# Patient Record
Sex: Female | Born: 1950 | Race: White | Hispanic: No | Marital: Married | Smoking: Never smoker
Health system: Southern US, Community
[De-identification: ages and names within clinical notes are randomized; demographics above are authoritative.]

## PROBLEM LIST (undated history)

## (undated) DIAGNOSIS — K635 Polyp of colon: Secondary | ICD-10-CM

## (undated) DIAGNOSIS — I499 Cardiac arrhythmia, unspecified: Secondary | ICD-10-CM

## (undated) DIAGNOSIS — M1712 Unilateral primary osteoarthritis, left knee: Secondary | ICD-10-CM

## (undated) DIAGNOSIS — K219 Gastro-esophageal reflux disease without esophagitis: Secondary | ICD-10-CM

## (undated) DIAGNOSIS — N6019 Diffuse cystic mastopathy of unspecified breast: Secondary | ICD-10-CM

## (undated) DIAGNOSIS — K5792 Diverticulitis of intestine, part unspecified, without perforation or abscess without bleeding: Secondary | ICD-10-CM

## (undated) DIAGNOSIS — M8580 Other specified disorders of bone density and structure, unspecified site: Secondary | ICD-10-CM

## (undated) DIAGNOSIS — M1990 Unspecified osteoarthritis, unspecified site: Secondary | ICD-10-CM

## (undated) DIAGNOSIS — D2111 Benign neoplasm of connective and other soft tissue of right upper limb, including shoulder: Secondary | ICD-10-CM

## (undated) DIAGNOSIS — G4700 Insomnia, unspecified: Secondary | ICD-10-CM

## (undated) DIAGNOSIS — H25012 Cortical age-related cataract, left eye: Secondary | ICD-10-CM

## (undated) DIAGNOSIS — E785 Hyperlipidemia, unspecified: Secondary | ICD-10-CM

## (undated) DIAGNOSIS — K5790 Diverticulosis of intestine, part unspecified, without perforation or abscess without bleeding: Secondary | ICD-10-CM

## (undated) HISTORY — PX: ORIF ANKLE FRACTURE: SUR919

## (undated) HISTORY — DX: Diverticulitis of intestine, part unspecified, without perforation or abscess without bleeding: K57.92

## (undated) HISTORY — PX: APPENDECTOMY: SHX54

## (undated) HISTORY — PX: ABDOMINAL HYSTERECTOMY: SHX81

---
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
## 1994-07-23 HISTORY — PX: BREAST BIOPSY: SHX20

## 1999-07-24 HISTORY — PX: ABDOMINAL HYSTERECTOMY: SHX81

## 2004-07-23 HISTORY — PX: ORIF ANKLE FRACTURE: SUR919

## 2004-08-14 ENCOUNTER — Ambulatory Visit: Payer: Self-pay | Admitting: Internal Medicine

## 2004-09-01 ENCOUNTER — Ambulatory Visit: Payer: Self-pay

## 2004-10-20 ENCOUNTER — Ambulatory Visit: Payer: Self-pay | Admitting: General Surgery

## 2005-02-05 ENCOUNTER — Ambulatory Visit: Payer: Self-pay | Admitting: Internal Medicine

## 2005-04-19 ENCOUNTER — Encounter: Payer: Self-pay | Admitting: Internal Medicine

## 2005-04-22 ENCOUNTER — Encounter: Payer: Self-pay | Admitting: Internal Medicine

## 2005-08-22 ENCOUNTER — Ambulatory Visit: Payer: Self-pay | Admitting: Internal Medicine

## 2006-02-26 ENCOUNTER — Ambulatory Visit: Payer: Self-pay | Admitting: Internal Medicine

## 2006-03-11 ENCOUNTER — Ambulatory Visit: Payer: Self-pay | Admitting: Internal Medicine

## 2006-03-14 ENCOUNTER — Encounter: Payer: Self-pay | Admitting: Internal Medicine

## 2006-03-23 ENCOUNTER — Encounter: Payer: Self-pay | Admitting: Internal Medicine

## 2006-04-22 ENCOUNTER — Encounter: Payer: Self-pay | Admitting: Internal Medicine

## 2006-07-12 ENCOUNTER — Ambulatory Visit: Payer: Self-pay | Admitting: Internal Medicine

## 2006-10-30 ENCOUNTER — Ambulatory Visit: Payer: Self-pay | Admitting: Internal Medicine

## 2007-05-02 ENCOUNTER — Ambulatory Visit: Payer: Self-pay | Admitting: Internal Medicine

## 2007-11-05 ENCOUNTER — Ambulatory Visit: Payer: Self-pay | Admitting: Internal Medicine

## 2008-05-10 ENCOUNTER — Ambulatory Visit: Payer: Self-pay | Admitting: Internal Medicine

## 2008-06-02 ENCOUNTER — Encounter: Payer: Self-pay | Admitting: Sports Medicine

## 2008-06-22 ENCOUNTER — Encounter: Payer: Self-pay | Admitting: Sports Medicine

## 2008-11-11 ENCOUNTER — Ambulatory Visit: Payer: Self-pay | Admitting: Internal Medicine

## 2009-05-23 ENCOUNTER — Ambulatory Visit: Payer: Self-pay | Admitting: Internal Medicine

## 2009-11-23 ENCOUNTER — Ambulatory Visit: Payer: Self-pay | Admitting: Internal Medicine

## 2010-05-29 ENCOUNTER — Ambulatory Visit: Payer: Self-pay | Admitting: Internal Medicine

## 2010-12-12 ENCOUNTER — Ambulatory Visit: Payer: Self-pay | Admitting: Internal Medicine

## 2011-06-18 ENCOUNTER — Ambulatory Visit: Payer: Self-pay | Admitting: Internal Medicine

## 2011-06-19 ENCOUNTER — Encounter: Payer: Self-pay | Admitting: Sports Medicine

## 2011-06-23 ENCOUNTER — Encounter: Payer: Self-pay | Admitting: Sports Medicine

## 2011-07-24 ENCOUNTER — Encounter: Payer: Self-pay | Admitting: Sports Medicine

## 2011-12-19 ENCOUNTER — Ambulatory Visit: Payer: Self-pay | Admitting: Internal Medicine

## 2012-06-17 ENCOUNTER — Ambulatory Visit: Payer: Self-pay | Admitting: Internal Medicine

## 2012-12-18 ENCOUNTER — Ambulatory Visit: Payer: Self-pay | Admitting: Internal Medicine

## 2013-07-01 ENCOUNTER — Ambulatory Visit: Payer: Self-pay

## 2013-09-01 ENCOUNTER — Ambulatory Visit: Payer: Self-pay | Admitting: Family Medicine

## 2013-11-19 DIAGNOSIS — K219 Gastro-esophageal reflux disease without esophagitis: Secondary | ICD-10-CM | POA: Insufficient documentation

## 2013-11-19 DIAGNOSIS — J309 Allergic rhinitis, unspecified: Secondary | ICD-10-CM | POA: Insufficient documentation

## 2013-11-19 DIAGNOSIS — E785 Hyperlipidemia, unspecified: Secondary | ICD-10-CM | POA: Insufficient documentation

## 2014-04-02 ENCOUNTER — Other Ambulatory Visit: Payer: Self-pay | Admitting: Internal Medicine

## 2014-04-02 DIAGNOSIS — Z803 Family history of malignant neoplasm of breast: Secondary | ICD-10-CM

## 2014-04-13 ENCOUNTER — Ambulatory Visit: Payer: Self-pay | Admitting: Family Medicine

## 2014-04-23 ENCOUNTER — Ambulatory Visit: Payer: Self-pay | Admitting: Family Medicine

## 2014-10-25 ENCOUNTER — Ambulatory Visit
Admit: 2014-10-25 | Disposition: A | Discharge status: Home / Self Care | Payer: Self-pay | Attending: Family Medicine | Admitting: Family Medicine

## 2014-12-09 ENCOUNTER — Encounter: Payer: Self-pay | Admitting: *Deleted

## 2014-12-10 ENCOUNTER — Ambulatory Visit
Admission: RE | Admit: 2014-12-10 | Discharge: 2014-12-10 | Disposition: A | Discharge status: Home / Self Care | Payer: 59 | Source: Ambulatory Visit | Attending: Gastroenterology | Admitting: Gastroenterology

## 2014-12-10 ENCOUNTER — Ambulatory Visit: Payer: 59 | Admitting: Anesthesiology

## 2014-12-10 ENCOUNTER — Encounter
Admission: RE | Disposition: A | Discharge status: Home / Self Care | Payer: Self-pay | Source: Ambulatory Visit | Attending: Gastroenterology

## 2014-12-10 ENCOUNTER — Encounter: Payer: Self-pay | Admitting: Anesthesiology

## 2014-12-10 DIAGNOSIS — Z885 Allergy status to narcotic agent status: Secondary | ICD-10-CM | POA: Diagnosis not present

## 2014-12-10 DIAGNOSIS — D122 Benign neoplasm of ascending colon: Secondary | ICD-10-CM | POA: Insufficient documentation

## 2014-12-10 DIAGNOSIS — Z882 Allergy status to sulfonamides status: Secondary | ICD-10-CM | POA: Insufficient documentation

## 2014-12-10 DIAGNOSIS — E785 Hyperlipidemia, unspecified: Secondary | ICD-10-CM | POA: Insufficient documentation

## 2014-12-10 DIAGNOSIS — K5730 Diverticulosis of large intestine without perforation or abscess without bleeding: Secondary | ICD-10-CM | POA: Insufficient documentation

## 2014-12-10 DIAGNOSIS — Z79899 Other long term (current) drug therapy: Secondary | ICD-10-CM | POA: Diagnosis not present

## 2014-12-10 DIAGNOSIS — Z1211 Encounter for screening for malignant neoplasm of colon: Secondary | ICD-10-CM | POA: Diagnosis not present

## 2014-12-10 DIAGNOSIS — K219 Gastro-esophageal reflux disease without esophagitis: Secondary | ICD-10-CM | POA: Insufficient documentation

## 2014-12-10 DIAGNOSIS — K648 Other hemorrhoids: Secondary | ICD-10-CM | POA: Insufficient documentation

## 2014-12-10 DIAGNOSIS — Z881 Allergy status to other antibiotic agents status: Secondary | ICD-10-CM | POA: Insufficient documentation

## 2014-12-10 HISTORY — DX: Hyperlipidemia, unspecified: E78.5

## 2014-12-10 HISTORY — DX: Gastro-esophageal reflux disease without esophagitis: K21.9

## 2014-12-10 HISTORY — DX: Cardiac arrhythmia, unspecified: I49.9

## 2014-12-10 HISTORY — PX: COLONOSCOPY: SHX5424

## 2014-12-10 SURGERY — COLONOSCOPY
Anesthesia: General

## 2014-12-10 MED ORDER — FENTANYL CITRATE (PF) 100 MCG/2ML IJ SOLN
INTRAMUSCULAR | Status: DC | PRN
  Administered 2014-12-10: 50 ug via INTRAVENOUS

## 2014-12-10 MED ORDER — SODIUM CHLORIDE 0.9 % IV SOLN
INTRAVENOUS | Status: DC
  Administered 2014-12-10: 1000 mL via INTRAVENOUS

## 2014-12-10 MED ORDER — PROPOFOL 10 MG/ML IV BOLUS
INTRAVENOUS | Status: DC | PRN
  Administered 2014-12-10 (×2): 40 mg via INTRAVENOUS

## 2014-12-10 MED ORDER — ONDANSETRON HCL 4 MG/2ML IJ SOLN
INTRAMUSCULAR | Status: DC | PRN
  Administered 2014-12-10: 4 mg via INTRAVENOUS

## 2014-12-10 MED ORDER — MIDAZOLAM HCL 5 MG/5ML IJ SOLN
INTRAMUSCULAR | Status: DC | PRN
  Administered 2014-12-10: 2 mg via INTRAVENOUS

## 2014-12-10 MED ORDER — SODIUM CHLORIDE 0.9 % IV SOLN: INTRAVENOUS | Status: DC

## 2014-12-10 MED ORDER — EPHEDRINE SULFATE 50 MG/ML IJ SOLN
INTRAMUSCULAR | Status: DC | PRN
  Administered 2014-12-10 (×2): 10 mg via INTRAVENOUS

## 2014-12-10 MED ORDER — LACTATED RINGERS IV SOLN
INTRAVENOUS | Status: DC | PRN
  Administered 2014-12-10: 12:00:00 via INTRAVENOUS

## 2014-12-10 MED ORDER — PROPOFOL INFUSION 10 MG/ML OPTIME
INTRAVENOUS | Status: DC | PRN
  Administered 2014-12-10: 140 ug/kg/min via INTRAVENOUS

## 2014-12-10 MED ORDER — LIDOCAINE HCL (CARDIAC) 20 MG/ML IV SOLN
INTRAVENOUS | Status: DC | PRN
  Administered 2014-12-10: 40 mg via INTRAVENOUS

## 2014-12-10 NOTE — Progress Notes (Signed)
Left AC IV D/C'd intact.

## 2014-12-10 NOTE — H&P (Signed)
Outpatient short stay form Pre-procedure 12/10/2014 11:38 AM Patricia Sails MD  Primary Physician: Dr. Baldemar Lenis  Reason for visit:  Screening colonoscopy  History of present illness:  Patient is a 64 year old Caucasian female who is presenting today for a screening colonoscopy as noted. Her last colonoscopy was in February 2006 no polyps that time. She has no family history of colon cancer or colon polyps. She takes no aspirin products or anticoagulants.    Current facility-administered medications:  .  0.9 %  sodium chloride infusion, , Intravenous, Continuous, Patricia Sails, MD, Last Rate: 20 mL/hr at 12/10/14 1108, 1,000 mL at 12/10/14 1108  Prescriptions prior to admission  Medication Sig Dispense Refill Last Dose  . Ascorbic Acid (VITAMIN C) 100 MG tablet Take 400 mg by mouth daily.   12/09/2014 at Unknown time  . azelastine (ASTELIN) 0.1 % nasal spray Place 1 spray into both nostrils 2 (two) times daily. Use in each nostril as directed   12/09/2014 at Unknown time  . calcium citrate-vitamin D (CITRACAL+D) 315-200 MG-UNIT per tablet Take 2 tablets by mouth 2 (two) times daily.   12/09/2014 at Unknown time  . celecoxib (CELEBREX) 200 MG capsule Take 200 mg by mouth daily.   12/09/2014 at Unknown time  . cetirizine (ZYRTEC) 10 MG tablet Take 10 mg by mouth daily.   12/09/2014 at Unknown time  . Estradiol (VAGIFEM VA) Place 1 applicator vaginally 2 (two) times a week.   12/09/2014 at Unknown time  . fluticasone (FLONASE) 50 MCG/ACT nasal spray Place 2 sprays into both nostrils daily.   12/09/2014 at Unknown time  . glucosamine-chondroitin 500-400 MG tablet Take 1 tablet by mouth 3 (three) times daily.   12/09/2014 at Unknown time  . guaifenesin (HUMIBID E) 400 MG TABS tablet Take 400 mg by mouth daily. For allergies for maple and pine trees   12/09/2014 at Unknown time  . promethazine (PHENERGAN) 25 MG tablet Take 25 mg by mouth every 6 (six) hours as needed for nausea or vomiting.    12/09/2014 at Unknown time  . simvastatin (ZOCOR) 40 MG tablet Take 40 mg by mouth daily.   12/09/2014 at Unknown time  . vitamin E 400 UNIT capsule Take 400 Units by mouth daily.   12/09/2014 at Unknown time     Allergies  Allergen Reactions  . Morphine And Related Nausea And Vomiting  . Ciprofloxacin Swelling and Rash  . Sulfur Itching, Swelling and Rash     Past Medical History  Diagnosis Date  . Dysrhythmia   . GERD (gastroesophageal reflux disease)   . Hyperlipidemia     Review of systems:      Physical Exam    Heart and lungs: Regular rate and rhythm without rub or gallop lungs are bilaterally clear to auscultation    HEENT: Normocephalic atraumatic eyes are anicteric    Other: Skip that    Pertinant exam for procedure: Soft nontender nondistended bowel sounds positive normoactive    Planned proceedures: Colonoscopy with indicated procedures. I have discussed the risks benefits and complications of procedures to include not limited to bleeding, infection, perforation and the risk of sedation and the patient wishes to proceed.    Patricia Sails, MD Gastroenterology 12/10/2014  11:38 AM

## 2014-12-10 NOTE — Anesthesia Preprocedure Evaluation (Addendum)
Anesthesia Evaluation  Patient identified by MRN, date of birth, ID band Patient awake    Reviewed: Allergy & Precautions, NPO status , Patient's Chart, lab work & pertinent test results  History of Anesthesia Complications (+) PONV and history of anesthetic complications (PONV with morphine)  Airway Mallampati: II  TM Distance: >3 FB Neck ROM: Full    Dental   Pulmonary          Cardiovascular + dysrhythmias (Pt denies)     Neuro/Psych    GI/Hepatic GERD-  Controlled and Medicated,  Endo/Other    Renal/GU      Musculoskeletal   Abdominal   Peds  Hematology   Anesthesia Other Findings   Reproductive/Obstetrics                            Anesthesia Physical Anesthesia Plan  ASA: II  Anesthesia Plan: General   Post-op Pain Management:    Induction: Intravenous  Airway Management Planned: Nasal Cannula  Additional Equipment:   Intra-op Plan:   Post-operative Plan:   Informed Consent: I have reviewed the patients History and Physical, chart, labs and discussed the procedure including the risks, benefits and alternatives for the proposed anesthesia with the patient or authorized representative who has indicated his/her understanding and acceptance.     Plan Discussed with:   Anesthesia Plan Comments:         Anesthesia Quick Evaluation

## 2014-12-10 NOTE — Transfer of Care (Signed)
Immediate Anesthesia Transfer of Care Note  Patient: Patricia Oliver  Procedure(s) Performed: Procedure(s): COLONOSCOPY (N/A)  Patient Location: PACU  Anesthesia Type:General  Level of Consciousness: sedated  Airway & Oxygen Therapy: Patient Spontanous Breathing and Patient connected to face mask oxygen  Post-op Assessment: Report given to RN  Post vital signs: Reviewed and stable  Last Vitals:  Filed Vitals:   12/10/14 1050  BP: 120/79  Pulse: 75  Temp: 36.1 C  Resp: 20    Complications: No apparent anesthesia complications

## 2014-12-10 NOTE — Anesthesia Postprocedure Evaluation (Signed)
  Anesthesia Post-op Note  Patient: Patricia Oliver  Procedure(s) Performed: Procedure(s): COLONOSCOPY (N/A)  Anesthesia type:General  Patient location: PACU  Post pain: Pain level controlled  Post assessment: Post-op Vital signs reviewed, Patient's Cardiovascular Status Stable, Respiratory Function Stable, Patent Airway and No signs of Nausea or vomiting  Post vital signs: Reviewed and stable  Last Vitals:  Filed Vitals:   12/10/14 1240  BP: 114/74  Pulse: 124  Temp: 36.6 C  Resp: 20    Level of consciousness: awake, alert  and patient cooperative  Complications: No apparent anesthesia complications

## 2014-12-10 NOTE — Op Note (Signed)
[facility] Gastroenterology Patient Name: Patricia Oliver Procedure Date: 12/10/2014 11:41 AM MRN: [id number] Account #: [id number] Date of Birth: 1950/10/30 Admit Type: Outpatient Age: 64 Room: [facility] ENDO ROOM 2 Gender: Female Note Status: Finalized Procedure:         Colonoscopy Indications:       Screening for colorectal malignant neoplasm Providers:         Christena Deem, MD Referring MD:      Hassell Halim (Referring MD) Medicines:         Monitored Anesthesia Care Complications:     No immediate complications. Procedure:         Pre-Anesthesia Assessment:                    - ASA Grade Assessment: II - A patient with mild systemic                     disease.                    After obtaining informed consent, the colonoscope was                     passed under direct vision. Throughout the procedure, the                     patient's blood pressure, pulse, and oxygen saturations                     were monitored continuously. The Olympus PCF-160AL                     colonoscope (S#. [id number]) was introduced through the anus                     and advanced to the the cecum, identified by appendiceal                     orifice and ileocecal valve. The colonoscopy was performed                     with moderate difficulty due to a tortuous colon.                     Successful completion of the procedure was aided by using                     manual pressure. The patient tolerated the procedure well.                     The quality of the bowel preparation was good. Findings:      Multiple small to medium diverticula were found in the sigmoid colon and       in the descending colon.      A 11 mm polyp was found in the proximal ascending colon. The polyp was       sessile. The polyp was removed with a cold biopsy forceps. Resection and       retrieval were complete.      The exam was otherwise without abnormality.      The digital rectal exam was  normal.      Non-bleeding internal hemorrhoids were found during anoscopy. The       hemorrhoids were small. Impression:        -  Diverticulosis in the sigmoid colon and in the                     descending colon.                    - One 11 mm polyp in the proximal ascending colon.                     Resected and retrieved.                    - The examination was otherwise normal.                    - Non-bleeding internal hemorrhoids. Recommendation:    - Await pathology results.                    - Telephone GI clinic for pathology results in 1 week. Procedure Code(s): --- Professional ---                    [phone], Colonoscopy, flexible; with biopsy, single or                     multiple Diagnosis Code(s): --- Professional ---                    V76.51, Special screening for malignant neoplasms of colon                    211.3, Benign neoplasm of colon                    455.0, Internal hemorrhoids without mention of complication                    562.10, Diverticulosis of colon (without mention of                     hemorrhage) CPT copyright 2014 American Medical Association. All rights reserved. The codes documented in this report are preliminary and upon coder review may  be revised to meet current compliance requirements. Christena Deem, MD 12/10/2014 12:35:55 PM This report has been signed electronically. Number of Addenda: 0 Note Initiated On: 12/10/2014 11:41 AM Scope Withdrawal Time: 0 hours 20 minutes 44 seconds  Total Procedure Duration: 0 hours 32 minutes 20 seconds       [facility]

## 2014-12-14 ENCOUNTER — Encounter: Payer: Self-pay | Admitting: Gastroenterology

## 2014-12-15 LAB — SURGICAL PATHOLOGY

## 2015-04-07 ENCOUNTER — Other Ambulatory Visit: Payer: Self-pay | Admitting: Family Medicine

## 2015-04-07 DIAGNOSIS — Z803 Family history of malignant neoplasm of breast: Secondary | ICD-10-CM

## 2015-05-16 ENCOUNTER — Ambulatory Visit
Admission: RE | Admit: 2015-05-16 | Discharge: 2015-05-16 | Disposition: A | Discharge status: Home / Self Care | Payer: 59 | Source: Ambulatory Visit | Attending: Family Medicine | Admitting: Family Medicine

## 2015-05-16 DIAGNOSIS — Z803 Family history of malignant neoplasm of breast: Secondary | ICD-10-CM

## 2015-06-07 ENCOUNTER — Ambulatory Visit: Payer: Self-pay | Admitting: Physician Assistant

## 2015-06-07 ENCOUNTER — Encounter: Payer: Self-pay | Admitting: Physician Assistant

## 2015-06-07 VITALS — BP 136/94 | HR 72 | Temp 98.6°F

## 2015-06-07 DIAGNOSIS — H9201 Otalgia, right ear: Secondary | ICD-10-CM

## 2015-06-07 DIAGNOSIS — Z9109 Other allergy status, other than to drugs and biological substances: Secondary | ICD-10-CM

## 2015-06-07 DIAGNOSIS — J329 Chronic sinusitis, unspecified: Secondary | ICD-10-CM

## 2015-06-07 DIAGNOSIS — H6523 Chronic serous otitis media, bilateral: Secondary | ICD-10-CM

## 2015-06-07 MED ORDER — MONTELUKAST SODIUM 10 MG PO TABS
10.0000 mg | ORAL_TABLET | Freq: Every day | ORAL | Status: DC
Start: 2015-06-07 — End: 2016-07-20

## 2015-06-07 NOTE — Progress Notes (Signed)
S chronic allergies and recurrant sinusitis, on rx xyzal and astelin without improvement sxs,  now with R ear pain, sore to lie on it , she just completed amoxicillan course she took belatedly from CV here, Denies grinding teeth   O/ bp mildly elevated NAD  ENT tms severely retracted, EACS clear  nasal mucosa pale, turbinates boggy , neg sinus tenderness at opening of EAC at jaw line, without TMJ crepitus.pharynx clear neck supple without nodes Heart rsr lungs clear  A/ chronic sinusitis,  Serous otitis media with ear pain and pressure, perennial allergies  P add singulair, nasal saline products and refer to ENT  Requested.

## 2015-06-08 NOTE — Progress Notes (Signed)
Spoke with [facility] ENT scheduled appt with Dr. Richardson Landry on 06/15/15 @ 9:00. Patient was notified progress notes was faxed over Fax# [phone]

## 2015-06-09 NOTE — Progress Notes (Signed)
Called in IT request to have this removed from Dr. Renold Genta as this is [facility] patient and not Dr. Verlene Mayer patient.  IT/Incident # [id number]

## 2015-06-15 ENCOUNTER — Other Ambulatory Visit: Payer: Self-pay | Admitting: Otolaryngology

## 2015-06-15 DIAGNOSIS — J324 Chronic pansinusitis: Secondary | ICD-10-CM

## 2015-06-21 ENCOUNTER — Ambulatory Visit
Admission: RE | Admit: 2015-06-21 | Discharge: 2015-06-21 | Disposition: A | Discharge status: Home / Self Care | Payer: 59 | Source: Ambulatory Visit | Attending: Otolaryngology | Admitting: Otolaryngology

## 2015-06-21 DIAGNOSIS — J324 Chronic pansinusitis: Secondary | ICD-10-CM | POA: Insufficient documentation

## 2015-06-21 DIAGNOSIS — J342 Deviated nasal septum: Secondary | ICD-10-CM | POA: Diagnosis not present

## 2015-09-04 DIAGNOSIS — J0100 Acute maxillary sinusitis, unspecified: Secondary | ICD-10-CM | POA: Diagnosis not present

## 2015-09-26 ENCOUNTER — Encounter: Payer: Self-pay | Admitting: Physician Assistant

## 2015-09-26 ENCOUNTER — Ambulatory Visit: Payer: Self-pay | Admitting: Physician Assistant

## 2015-09-26 VITALS — BP 120/80 | HR 76 | Temp 98.8°F

## 2015-09-26 DIAGNOSIS — J019 Acute sinusitis, unspecified: Secondary | ICD-10-CM

## 2015-09-27 NOTE — Progress Notes (Signed)
See note in paper chart by Heather Ratcliffe, PAC  

## 2015-10-05 DIAGNOSIS — Z79899 Other long term (current) drug therapy: Secondary | ICD-10-CM | POA: Diagnosis not present

## 2015-10-05 DIAGNOSIS — E7800 Pure hypercholesterolemia, unspecified: Secondary | ICD-10-CM | POA: Diagnosis not present

## 2015-10-14 DIAGNOSIS — E7800 Pure hypercholesterolemia, unspecified: Secondary | ICD-10-CM | POA: Diagnosis not present

## 2015-10-14 DIAGNOSIS — J301 Allergic rhinitis due to pollen: Secondary | ICD-10-CM | POA: Diagnosis not present

## 2015-10-14 DIAGNOSIS — Z803 Family history of malignant neoplasm of breast: Secondary | ICD-10-CM | POA: Diagnosis not present

## 2015-10-14 DIAGNOSIS — K219 Gastro-esophageal reflux disease without esophagitis: Secondary | ICD-10-CM | POA: Diagnosis not present

## 2015-10-19 ENCOUNTER — Other Ambulatory Visit: Payer: Self-pay | Admitting: Family Medicine

## 2015-10-19 DIAGNOSIS — Z803 Family history of malignant neoplasm of breast: Secondary | ICD-10-CM

## 2015-10-20 DIAGNOSIS — J309 Allergic rhinitis, unspecified: Secondary | ICD-10-CM | POA: Diagnosis not present

## 2015-10-20 DIAGNOSIS — H1045 Other chronic allergic conjunctivitis: Secondary | ICD-10-CM | POA: Diagnosis not present

## 2015-10-20 DIAGNOSIS — J3089 Other allergic rhinitis: Secondary | ICD-10-CM | POA: Diagnosis not present

## 2015-10-20 DIAGNOSIS — J301 Allergic rhinitis due to pollen: Secondary | ICD-10-CM | POA: Diagnosis not present

## 2015-10-28 ENCOUNTER — Ambulatory Visit
Admission: RE | Admit: 2015-10-28 | Discharge: 2015-10-28 | Disposition: A | Discharge status: Home / Self Care | Payer: 59 | Source: Ambulatory Visit | Attending: Family Medicine | Admitting: Family Medicine

## 2015-10-28 DIAGNOSIS — N6489 Other specified disorders of breast: Secondary | ICD-10-CM | POA: Diagnosis not present

## 2015-10-28 DIAGNOSIS — Z803 Family history of malignant neoplasm of breast: Secondary | ICD-10-CM

## 2015-10-28 MED ORDER — GADOBENATE DIMEGLUMINE 529 MG/ML IV SOLN
12.0000 mL | Freq: Once | INTRAVENOUS | Status: AC | PRN
  Administered 2015-10-28: 12 mL via INTRAVENOUS

## 2015-11-30 DIAGNOSIS — M21621 Bunionette of right foot: Secondary | ICD-10-CM | POA: Diagnosis not present

## 2015-11-30 DIAGNOSIS — M79671 Pain in right foot: Secondary | ICD-10-CM | POA: Diagnosis not present

## 2015-11-30 DIAGNOSIS — M7751 Other enthesopathy of right foot: Secondary | ICD-10-CM | POA: Diagnosis not present

## 2016-01-05 DIAGNOSIS — L821 Other seborrheic keratosis: Secondary | ICD-10-CM | POA: Diagnosis not present

## 2016-01-05 DIAGNOSIS — D225 Melanocytic nevi of trunk: Secondary | ICD-10-CM | POA: Diagnosis not present

## 2016-01-05 DIAGNOSIS — D2271 Melanocytic nevi of right lower limb, including hip: Secondary | ICD-10-CM | POA: Diagnosis not present

## 2016-01-05 DIAGNOSIS — D2272 Melanocytic nevi of left lower limb, including hip: Secondary | ICD-10-CM | POA: Diagnosis not present

## 2016-03-13 ENCOUNTER — Ambulatory Visit: Payer: 59 | Attending: Family Medicine | Admitting: Physical Therapy

## 2016-03-13 DIAGNOSIS — M25552 Pain in left hip: Secondary | ICD-10-CM | POA: Insufficient documentation

## 2016-03-13 DIAGNOSIS — M545 Low back pain: Secondary | ICD-10-CM | POA: Insufficient documentation

## 2016-03-13 NOTE — Therapy (Signed)
[facility] MAIN [facility] SERVICES [address] [place], [place], [address] Phone: [phone]   Fax:  [phone]  Physical Therapy Free Screen  Patient Details  Name: Patricia MARTINELL MRN: [id number] Date of Birth: 1950-08-02 No Data Recorded  Encounter Date: 03/13/2016   Past Medical History:  Diagnosis Date  . Dysrhythmia   . GERD (gastroesophageal reflux disease)   . Hyperlipidemia     Past Surgical History:  Procedure Laterality Date  . ABDOMINAL HYSTERECTOMY    . APPENDECTOMY    . BREAST BIOPSY Left 1996   neh  . COLONOSCOPY N/A 12/10/2014   Procedure: COLONOSCOPY;  Surgeon: Christena Deem, MD;  Location: [facility] ENDOSCOPY;  Service: Endoscopy;  Laterality: N/A;  . ORIF ANKLE FRACTURE      There were no vitals filed for this visit.  PT Screening Form   Time: in__4:07p____     Time out__4:43p___   Complaint __L sided low back pain with tingling and numbness radiating down LLE_____ Past Medical Hx:  __ORIF ankle fracture, allergies_____ Injury Date:_____End of July________  Pain Scale: _Current; 5/10, Worst 7/10______ Patient's phone number: [phone] (cell)  Hx (this occurrence):  Patient states that she assisted someone in moving at the end of July. After lifting and moving a lot of items that day, she drove a few hours home and began noticing symptoms in her L bottom. Since the onset of this episode, the patient reports a worsening of symptoms with numbness and tingling moving down into her leg all the way to her L foot. The patient reports she has increased difficulty with sitting for long periods of time with an increase in pain beginning after 15-20 minutes of sitting. When in the car or sitting for long periods of time, she relieves her pain by off setting her bottom with a pillow under the L bottom. She is also able to relieve her symptoms by standing, walking, or when she goes to sleep on her stomach. Her symptoms  relieve after ~20 minutes in these relieving positions. She denies B&B incontinence. The pain does not wake her up or keep her up. She reports she takes celebrix daily due to previous ankle fracture.   Assessment: Patient has fair posture with mild forward flexed head and decreased spinal curves. She is able to correct when instructed. With a gross assessment, patient's AROM in B UE/LE is [facility]; strength is consistent throughout B UE/LE with mild hip flexor weakness bilaterally. Gross assessment of lumbar AROM is good with no pain into any motion. Standing repeated motions into flexion and extension did not centralize or peripheralize symptoms although patient stated flexion felt good due to stretching.  FABERS and SI compression are negative. Patient is tender upon palpation of  L piriformis that increased her symptoms and peripheralizes. No palpable tightness on L/R piriformis. With piriformis stretching, patient notes that it stretches the painful spot with no increase in radiating symptoms. LLE long axis manual distraction relieves radiating symptoms. In prone position, patient states pain maintains in her L bottom with no radiating symptoms.     Recommendations:    Given supine and seated piriformis stretch as well as prone lumbar extensions. Patient given a handout for exercises. Instructed to perform pelvic tilts, use lumbar roll, unweight bottom with reclining passenger seat, and take breaks to move during long car rides due to patient going out of town this weekend. Instructed to patient to perform the given exercises for 2 weeks and if no  improvement or symptoms worsen quickly to call and a PT evaluation would be initiated.  Comments:  After 2 weeks, if patient has not had improved symptoms set up PT evaluation.    []  Patient would benefit from an MD referral []  Patient would benefit from a full PT/OT/ SLP evaluation and treatment. [x]  No intervention recommended at this  time.                                       Patient will benefit from skilled therapeutic intervention in order to improve the following deficits and impairments:     Visit Diagnosis: Pain in left hip  Left low back pain, with sciatica presence unspecified     Problem List There are no active problems to display for this patient.  Trula Ore, SPT This entire session was performed under direct supervision and direction of a licensed therapist/therapist assistant . I have personally read, edited and approve of the note as written.  Trotter,Margaret PT, DPT 03/14/2016, 11:24 AM  [facility] MAIN [facility] SERVICES [address] [place], [place], [address] Phone: [phone]   Fax:  [phone]  Name: CHANTRICE BOHNEN MRN: [id number] Date of Birth: 05-26-1951

## 2016-03-13 NOTE — Patient Instructions (Addendum)
Piriformis Stretch    Lying on back, pull right knee toward opposite shoulder. Hold _10-15___ seconds. Repeat _5___ times. Do __2-3__ sessions per day.  http://gt2.exer.us/258   Copyright  VHI. All rights reserved.  Piriformis Stretch, Sitting    Sit, back straight, one leg straight, other leg bent, ankle on opposite thigh. Grasp knee and ankle of crossed leg. Pull leg toward trunk. Feel stretch in gluteals. Hold _10-15__ seconds.  Repeat _5__ times per session. Do _2-3__ sessions per day.  Copyright  VHI. All rights reserved.  Back Hyperextension: Using Arms    Lying face down with arms bent, inhale. Then while exhaling, straighten arms. Hold __2__ seconds. Slowly return to starting position. Repeat __5-7__ times per set. Do __2__ sets per session. Do __2-3__ sessions per day.  Copyright  VHI. All rights reserved.

## 2016-03-30 DIAGNOSIS — M1712 Unilateral primary osteoarthritis, left knee: Secondary | ICD-10-CM | POA: Diagnosis not present

## 2016-04-13 DIAGNOSIS — Z79899 Other long term (current) drug therapy: Secondary | ICD-10-CM | POA: Diagnosis not present

## 2016-04-13 DIAGNOSIS — E7800 Pure hypercholesterolemia, unspecified: Secondary | ICD-10-CM | POA: Diagnosis not present

## 2016-04-16 ENCOUNTER — Other Ambulatory Visit: Payer: Self-pay | Admitting: Family Medicine

## 2016-04-16 DIAGNOSIS — Z1231 Encounter for screening mammogram for malignant neoplasm of breast: Secondary | ICD-10-CM

## 2016-04-16 DIAGNOSIS — Z79899 Other long term (current) drug therapy: Secondary | ICD-10-CM | POA: Diagnosis not present

## 2016-04-16 DIAGNOSIS — Z803 Family history of malignant neoplasm of breast: Secondary | ICD-10-CM | POA: Diagnosis not present

## 2016-04-16 DIAGNOSIS — E7800 Pure hypercholesterolemia, unspecified: Secondary | ICD-10-CM | POA: Diagnosis not present

## 2016-05-16 ENCOUNTER — Ambulatory Visit
Admission: RE | Admit: 2016-05-16 | Discharge: 2016-05-16 | Disposition: A | Discharge status: Home / Self Care | Payer: 59 | Source: Ambulatory Visit | Attending: Family Medicine | Admitting: Family Medicine

## 2016-05-16 DIAGNOSIS — Z1231 Encounter for screening mammogram for malignant neoplasm of breast: Secondary | ICD-10-CM | POA: Diagnosis not present

## 2016-06-08 DIAGNOSIS — R319 Hematuria, unspecified: Secondary | ICD-10-CM | POA: Diagnosis not present

## 2016-06-25 DIAGNOSIS — R319 Hematuria, unspecified: Secondary | ICD-10-CM | POA: Diagnosis not present

## 2016-06-30 DIAGNOSIS — H8110 Benign paroxysmal vertigo, unspecified ear: Secondary | ICD-10-CM | POA: Diagnosis not present

## 2016-07-20 ENCOUNTER — Ambulatory Visit: Payer: 59 | Admitting: Urology

## 2016-07-20 ENCOUNTER — Encounter: Payer: Self-pay | Admitting: Urology

## 2016-07-20 VITALS — BP 133/81 | HR 72 | Ht 64.0 in | Wt 144.3 lb

## 2016-07-20 DIAGNOSIS — R310 Gross hematuria: Secondary | ICD-10-CM | POA: Diagnosis not present

## 2016-07-20 LAB — URINALYSIS, COMPLETE
BILIRUBIN UA: NEGATIVE
GLUCOSE, UA: NEGATIVE
LEUKOCYTES UA: NEGATIVE
Nitrite, UA: NEGATIVE
PH UA: 5 (ref 5.0–7.5)
PROTEIN UA: NEGATIVE
Specific Gravity, UA: >1.03 — ABNORMAL HIGH (ref 1.005–1.030)
UUROB: 0.2 mg/dL (ref 0.2–1.0)

## 2016-07-20 LAB — MICROSCOPIC EXAMINATION

## 2016-07-20 NOTE — Progress Notes (Signed)
07/20/2016 10:46 AM   Patricia Oliver [birth year] [id number]  Referring provider: Kandyce Rud, MD [address] [facility] [place], [place] [address]  Chief Complaint  Patient presents with  . New Patient (Initial Visit)    gross hematuria     HPI: The patient is a 65 year old female presents today for evaluation of gross hematuria. His also been confirmed on microscopic examination.  This happened a proximal and one month ago. She has persistent microscopic hematuria today but her gross hematuria has resolved. She has no history of nephrolithiasis. She has not had any recent urinary tract infections. She denies dysuria or other problems urination at this time. 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 Pathology was benign.   PMH: Past Medical History:  Diagnosis Date  . Dysrhythmia   . GERD (gastroesophageal reflux disease)   . Hyperlipidemia     Surgical History: Past Surgical History:  Procedure Laterality Date  . ABDOMINAL HYSTERECTOMY    . APPENDECTOMY    . BREAST BIOPSY Left 1996   neh  . COLONOSCOPY N/A 12/10/2014   Procedure: COLONOSCOPY;  Surgeon: Christena Deem, MD;  Location: [facility] ENDOSCOPY;  Service: Endoscopy;  Laterality: N/A;  . ORIF ANKLE FRACTURE      Home Medications:  Allergies as of 07/20/2016      Reactions   Morphine And Related Nausea And Vomiting   Ciprofloxacin Swelling, Rash   Sulfur Itching, Swelling, Rash      Medication List       Accurate as of 07/20/16 10:46 AM. Always use your most recent med list.          azelastine 0.1 % nasal spray Commonly known as:  ASTELIN Place 1 spray into both nostrils 2 (two) times daily. Use in each nostril as directed   calcium citrate-vitamin D 315-200 MG-UNIT tablet Commonly known as:  CITRACAL+D Take 2 tablets by mouth 2 (two) times daily.   celecoxib 200 MG capsule Commonly known as:   CELEBREX Take by mouth.   fluticasone 50 MCG/ACT nasal spray Commonly known as:  FLONASE Place 2 sprays into both nostrils daily.   glucosamine-chondroitin 500-400 MG tablet Take 1 tablet by mouth 3 (three) times daily.   guaifenesin 400 MG Tabs tablet Commonly known as:  HUMIBID E Take 400 mg by mouth daily. For allergies for maple and pine trees   levocetirizine 5 MG tablet Commonly known as:  XYZAL Take 5 mg by mouth every evening.   simvastatin 40 MG tablet Commonly known as:  ZOCOR Take by mouth.   VAGIFEM VA Place 1 applicator vaginally 2 (two) times a week.   vitamin C 100 MG tablet Take 400 mg by mouth daily.   vitamin E 400 UNIT capsule Take 400 Units by mouth daily.       Allergies:  Allergies  Allergen Reactions  . Morphine And Related Nausea And Vomiting  . Ciprofloxacin Swelling and Rash  . Sulfur Itching, Swelling and Rash    Family History: Family History  Problem Relation Age of Onset  . Breast cancer Sister 52  . Breast cancer Paternal Aunt   . Breast cancer Paternal Aunt   . Breast cancer Sister 59    dx twice with breast ca    Social History:  reports that she has never smoked. She has never used smokeless tobacco. Her alcohol and drug histories are not on  file.  ROS: UROLOGY Frequent Urination?: No Hard to postpone urination?: No Burning/pain with urination?: No Get up at night to urinate?: Yes Leakage of urine?: No Urine stream starts and stops?: No Trouble starting stream?: No Do you have to strain to urinate?: No Blood in urine?: Yes Urinary tract infection?: No Sexually transmitted disease?: No Injury to kidneys or bladder?: No Painful intercourse?: No Weak stream?: No Currently pregnant?: No Vaginal bleeding?: No Last menstrual period?: No  Gastrointestinal Nausea?: No Vomiting?: No Indigestion/heartburn?: No Diarrhea?: No Constipation?: No  Constitutional Fever: No Night sweats?: No Weight loss?:  No Fatigue?: No  Skin Skin rash/lesions?: No Itching?: No  Eyes Blurred vision?: No Double vision?: No  Ears/Nose/Throat Sore throat?: No Sinus problems?: No  Hematologic/Lymphatic Swollen glands?: No Easy bruising?: No  Cardiovascular Leg swelling?: No Chest pain?: No  Respiratory Cough?: No Shortness of breath?: No  Endocrine Excessive thirst?: No  Musculoskeletal Back pain?: No Joint pain?: No  Neurological Headaches?: No Dizziness?: No  Psychologic Depression?: No Anxiety?: No  Physical Exam: BP 133/81   Pulse 72   Ht 5\' 4"  (1.626 m)   Wt 144 lb 4.8 oz (65.5 kg)   BMI 24.77 kg/m   Constitutional:  Alert and oriented, No acute distress. HEENT: [place] AT, moist mucus membranes.  Trachea midline, no masses. Cardiovascular: No clubbing, cyanosis, or edema. Respiratory: Normal respiratory effort, no increased work of breathing. GI: Abdomen is soft, nontender, nondistended, no abdominal masses GU: No CVA tenderness.  Skin: No rashes, bruises or suspicious lesions. Lymph: No cervical or inguinal adenopathy. Neurologic: Grossly intact, no focal deficits, moving all 4 extremities. Psychiatric: Normal mood and affect.  Laboratory Data: No results found for: WBC, HGB, HCT, MCV, PLT  No results found for: CREATININE  No results found for: PSA  No results found for: TESTOSTERONE  No results found for: HGBA1C  Urinalysis No results found for: COLORURINE, APPEARANCEUR, LABSPEC, PHURINE, GLUCOSEU, HGBUR, BILIRUBINUR, KETONESUR, PROTEINUR, UROBILINOGEN, NITRITE, LEUKOCYTESUR  Assessment & Plan:    1. Gross hematuria I discussed the hematuria workup with the patient. She is agreeable to proceeding. She'll follow-up for cystoscopy after undergoing a CT urogram.  Return for after CT for cystoscopy.  [name], MD  [facility] [address], [address] [place], [place] [address] [phone]

## 2016-08-02 ENCOUNTER — Ambulatory Visit: Payer: 59

## 2016-08-03 ENCOUNTER — Ambulatory Visit
Admission: RE | Admit: 2016-08-03 | Discharge: 2016-08-03 | Disposition: A | Discharge status: Home / Self Care | Payer: 59 | Source: Ambulatory Visit | Attending: Urology | Admitting: Urology

## 2016-08-03 DIAGNOSIS — K5710 Diverticulosis of small intestine without perforation or abscess without bleeding: Secondary | ICD-10-CM | POA: Diagnosis not present

## 2016-08-03 DIAGNOSIS — M899 Disorder of bone, unspecified: Secondary | ICD-10-CM | POA: Insufficient documentation

## 2016-08-03 DIAGNOSIS — K769 Liver disease, unspecified: Secondary | ICD-10-CM | POA: Insufficient documentation

## 2016-08-03 DIAGNOSIS — I700 Atherosclerosis of aorta: Secondary | ICD-10-CM | POA: Diagnosis not present

## 2016-08-03 DIAGNOSIS — R3129 Other microscopic hematuria: Secondary | ICD-10-CM | POA: Insufficient documentation

## 2016-08-03 DIAGNOSIS — N200 Calculus of kidney: Secondary | ICD-10-CM | POA: Insufficient documentation

## 2016-08-03 DIAGNOSIS — R310 Gross hematuria: Secondary | ICD-10-CM

## 2016-08-03 LAB — POCT I-STAT CREATININE: Creatinine, Ser: 0.7 mg/dL (ref 0.44–1.00)

## 2016-08-03 MED ORDER — IOPAMIDOL (ISOVUE-300) INJECTION 61%
125.0000 mL | Freq: Once | INTRAVENOUS | Status: AC | PRN
  Administered 2016-08-03: 125 mL via INTRAVENOUS

## 2016-08-23 ENCOUNTER — Other Ambulatory Visit: Payer: 59

## 2016-08-29 ENCOUNTER — Other Ambulatory Visit: Payer: 59 | Admitting: Urology

## 2016-08-30 ENCOUNTER — Encounter: Payer: Self-pay | Admitting: Urology

## 2016-08-30 ENCOUNTER — Ambulatory Visit (INDEPENDENT_AMBULATORY_CARE_PROVIDER_SITE_OTHER): Payer: 59 | Admitting: Urology

## 2016-08-30 VITALS — BP 137/84 | HR 86 | Ht 64.0 in | Wt 139.0 lb

## 2016-08-30 DIAGNOSIS — N200 Calculus of kidney: Secondary | ICD-10-CM

## 2016-08-30 DIAGNOSIS — R310 Gross hematuria: Secondary | ICD-10-CM | POA: Diagnosis not present

## 2016-08-30 LAB — URINALYSIS, COMPLETE
Bilirubin, UA: NEGATIVE
Glucose, UA: NEGATIVE
KETONES UA: NEGATIVE
NITRITE UA: NEGATIVE
PH UA: 5.5 (ref 5.0–7.5)
Protein, UA: NEGATIVE
Specific Gravity, UA: 1.015 (ref 1.005–1.030)
Urobilinogen, Ur: 0.2 mg/dL (ref 0.2–1.0)

## 2016-08-30 LAB — MICROSCOPIC EXAMINATION: BACTERIA UA: NONE SEEN

## 2016-08-30 MED ORDER — DOXYCYCLINE HYCLATE 100 MG PO TABS
100.0000 mg | ORAL_TABLET | Freq: Once | ORAL | Status: AC
  Administered 2016-08-30: 100 mg via ORAL

## 2016-08-30 MED ORDER — LIDOCAINE HCL 2 % EX GEL
1.0000 "application " | Freq: Once | CUTANEOUS | Status: AC
  Administered 2016-08-30: 1 via URETHRAL

## 2016-08-30 NOTE — Progress Notes (Signed)
   08/30/16  CC:  Chief Complaint  Patient presents with  . Cysto    HPI: The patient is a 66 year old female presents today for evaluation of gross hematuria. This was also been confirmed on microscopic examination.  This happened a proximal and one month ago. She has persistent microscopic hematuria today but her gross hematuria has resolved. She has no history of nephrolithiasis. She has not had any recent urinary tract infections. She denies dysuria or other problems urination at this time. 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 Pathology was benign.  CT urogram negative for source of gross hematuria except for 1 mm left upper renal pole stone.  There were no vitals taken for this visit. NED. A&Ox3.   No respiratory distress   Abd soft, NT, ND Normal external genitalia with patent urethral meatus  Cystoscopy Procedure Note  Patient identification was confirmed, informed consent was obtained, and patient was prepped using Betadine solution.  Lidocaine jelly was administered per urethral meatus.    Preoperative abx where received prior to procedure.    Procedure: - Flexible cystoscope introduced, without any difficulty.   - Thorough search of the bladder revealed:    Urethral caruncle noted    normal urothelium    no stones    no ulcers     no tumors    no urethral polyps    no trabeculation  - Ureteral orifices were normal in position and appearance.  Post-Procedure: - Patient tolerated the procedure well  Assessment/ Plan:  1. Gross hematuria -Negative hematuria work up except for urethral caruncle and very small stone. Follow up in one year for repeat urinalysis.  2. Small left renal stone <2 mm. No further work up indicated  3. Small liver lesion Likely benign per radiologist. Patient plans to discuss further need for MRI with PCP  Nickie Retort, MD

## 2016-09-10 ENCOUNTER — Encounter: Payer: Self-pay | Admitting: Physician Assistant

## 2016-09-10 ENCOUNTER — Ambulatory Visit: Payer: Self-pay | Admitting: Physician Assistant

## 2016-09-10 VITALS — BP 134/82 | HR 71 | Temp 98.5°F

## 2016-09-10 DIAGNOSIS — B349 Viral infection, unspecified: Secondary | ICD-10-CM

## 2016-09-10 LAB — POCT INFLUENZA A/B
Influenza A, POC: NEGATIVE
Influenza B, POC: NEGATIVE

## 2016-09-10 MED ORDER — AZITHROMYCIN 250 MG PO TABS: ORAL_TABLET | ORAL | 0 refills | Status: DC

## 2016-09-10 NOTE — Progress Notes (Signed)
S: c/o sore throat, mild headache, body aches for 2 days, no fever/chills, does have mid back pain, no v/d, husband has been in the hospital so has been at the hospital a lot, did have flu vaccine  O: vitals wnl, nad, tms clear, nasal mucosa irritated, throat wnl, neck supple no lymph, lungs c t a, cv rrr, flu swab  A: acute uri  P: fluids, reassurance, zpack if worsening

## 2016-09-24 DIAGNOSIS — Z79899 Other long term (current) drug therapy: Secondary | ICD-10-CM | POA: Diagnosis not present

## 2016-09-24 DIAGNOSIS — E7800 Pure hypercholesterolemia, unspecified: Secondary | ICD-10-CM | POA: Diagnosis not present

## 2016-10-04 ENCOUNTER — Other Ambulatory Visit: Payer: Self-pay | Admitting: Family Medicine

## 2016-10-04 DIAGNOSIS — J309 Allergic rhinitis, unspecified: Secondary | ICD-10-CM | POA: Diagnosis not present

## 2016-10-04 DIAGNOSIS — Z803 Family history of malignant neoplasm of breast: Secondary | ICD-10-CM

## 2016-10-04 DIAGNOSIS — D649 Anemia, unspecified: Secondary | ICD-10-CM | POA: Diagnosis not present

## 2016-10-04 DIAGNOSIS — K219 Gastro-esophageal reflux disease without esophagitis: Secondary | ICD-10-CM | POA: Diagnosis not present

## 2016-10-04 DIAGNOSIS — E7800 Pure hypercholesterolemia, unspecified: Secondary | ICD-10-CM | POA: Diagnosis not present

## 2016-10-19 ENCOUNTER — Ambulatory Visit: Payer: 59

## 2016-10-29 ENCOUNTER — Ambulatory Visit
Admission: RE | Admit: 2016-10-29 | Discharge: 2016-10-29 | Disposition: A | Discharge status: Home / Self Care | Payer: 59 | Source: Ambulatory Visit | Attending: Family Medicine | Admitting: Family Medicine

## 2016-10-29 DIAGNOSIS — Z803 Family history of malignant neoplasm of breast: Secondary | ICD-10-CM

## 2016-10-29 DIAGNOSIS — N6489 Other specified disorders of breast: Secondary | ICD-10-CM | POA: Diagnosis not present

## 2016-10-29 MED ORDER — GADOBENATE DIMEGLUMINE 529 MG/ML IV SOLN
15.0000 mL | Freq: Once | INTRAVENOUS | Status: AC | PRN
  Administered 2016-10-29: 15 mL via INTRAVENOUS

## 2016-11-01 ENCOUNTER — Encounter: Payer: Self-pay | Admitting: Physician Assistant

## 2016-11-01 ENCOUNTER — Other Ambulatory Visit
Admission: RE | Admit: 2016-11-01 | Discharge: 2016-11-01 | Disposition: A | Discharge status: Home / Self Care | Payer: 59 | Source: Ambulatory Visit | Attending: Physician Assistant | Admitting: Physician Assistant

## 2016-11-01 ENCOUNTER — Ambulatory Visit: Payer: Self-pay | Admitting: Physician Assistant

## 2016-11-01 VITALS — BP 119/80 | HR 81 | Temp 98.4°F

## 2016-11-01 DIAGNOSIS — D649 Anemia, unspecified: Secondary | ICD-10-CM

## 2016-11-01 DIAGNOSIS — J309 Allergic rhinitis, unspecified: Secondary | ICD-10-CM

## 2016-11-01 LAB — CBC WITH DIFFERENTIAL/PLATELET
BASOS ABS: 0 10*3/uL (ref 0–0.1)
BASOS PCT: 0 %
EOS ABS: 0.1 10*3/uL (ref 0–0.7)
EOS PCT: 3 %
HEMATOCRIT: 37.5 % (ref 35.0–47.0)
Hemoglobin: 11.7 g/dL — ABNORMAL LOW (ref 12.0–16.0)
Lymphocytes Relative: 25 %
Lymphs Abs: 1.1 10*3/uL (ref 1.0–3.6)
MCH: 22.1 pg — ABNORMAL LOW (ref 26.0–34.0)
MCHC: 31.2 g/dL — AB (ref 32.0–36.0)
MCV: 70.9 fL — ABNORMAL LOW (ref 80.0–100.0)
MONO ABS: 0.3 10*3/uL (ref 0.2–0.9)
Monocytes Relative: 7 %
NEUTROS ABS: 2.8 10*3/uL (ref 1.4–6.5)
Neutrophils Relative %: 65 %
PLATELETS: 222 10*3/uL (ref 150–440)
RBC: 5.28 MIL/uL — ABNORMAL HIGH (ref 3.80–5.20)
RDW: 18.1 % — AB (ref 11.5–14.5)
WBC: 4.3 10*3/uL (ref 3.6–11.0)

## 2016-11-01 MED ORDER — PREDNISONE 10 MG PO TABS: 30.0000 mg | ORAL_TABLET | Freq: Every day | ORAL | 0 refills | Status: DC

## 2016-11-01 NOTE — Progress Notes (Signed)
S: pt here for sinus pain, sore throat, fatigue, states her allergies are acting up, no fever/chills/cp/sob, has a kidney stone which was dx after seeing blood in her urine, her hgb is usually 15, it has dropped to 11 and they wouldn't let her donate blood, had a colonoscopy which was ok but can't remember how long ago  O: vitals wnl, nad, tms clear, nasal mucosa swollen, throat wnl, neck supple no lymph, lungs c t a, cv rrr  A: allergic sinusitis, low hgb by hx  P: pred 30mg  qd x 3d, cbc ordered, will forward results to dr Baldemar Lenis

## 2016-11-07 ENCOUNTER — Ambulatory Visit: Payer: Self-pay | Admitting: Physician Assistant

## 2016-11-07 MED ORDER — AMOXICILLIN 875 MG PO TABS: 875.0000 mg | ORAL_TABLET | Freq: Two times a day (BID) | ORAL | 0 refills | Status: DC

## 2016-11-07 MED ORDER — BENZONATATE 200 MG PO CAPS: 200.0000 mg | ORAL_CAPSULE | Freq: Two times a day (BID) | ORAL | 0 refills | Status: DC | PRN

## 2016-11-07 NOTE — Progress Notes (Signed)
Pt called the office and is coughing a lot and having green nasal drainage, ?if could get an antibiotic at this time Sent rx for amoxil and tessalon perls to [place]

## 2017-01-04 DIAGNOSIS — G5702 Lesion of sciatic nerve, left lower limb: Secondary | ICD-10-CM | POA: Diagnosis not present

## 2017-01-04 DIAGNOSIS — M1712 Unilateral primary osteoarthritis, left knee: Secondary | ICD-10-CM | POA: Diagnosis not present

## 2017-01-17 ENCOUNTER — Encounter: Payer: Self-pay | Admitting: Physician Assistant

## 2017-01-17 ENCOUNTER — Ambulatory Visit: Payer: Self-pay | Admitting: Physician Assistant

## 2017-01-17 VITALS — BP 130/84 | HR 80 | Temp 98.5°F

## 2017-01-17 DIAGNOSIS — H8110 Benign paroxysmal vertigo, unspecified ear: Secondary | ICD-10-CM

## 2017-01-17 MED ORDER — SCOPOLAMINE 1 MG/3DAYS TD PT72: 1.0000 | MEDICATED_PATCH | TRANSDERMAL | 12 refills | Status: DC

## 2017-01-17 NOTE — Progress Notes (Signed)
S: c/o dizziness, states always gets vertigo at the end of allergy season but this has lasted a lot longer, meclizine helps, no cp/sob, no headaches; also needs scopaline patches for upcoming cruise  O: vitals wnl, nad, tms clear, nasal mucosa wnl, throat wnl, neck supple no lymph, lungs c t a, cv rrr, performed maneuvers, pt tolerated well, feels a little better  A: vertigo  P: scopaline transdermal patches, continue meclizine, if not better in a few days f/u with pcp

## 2017-01-21 DIAGNOSIS — H8110 Benign paroxysmal vertigo, unspecified ear: Secondary | ICD-10-CM | POA: Diagnosis not present

## 2017-02-06 ENCOUNTER — Telehealth: Payer: Self-pay | Admitting: Physician Assistant

## 2017-02-06 MED ORDER — ONDANSETRON HCL 4 MG PO TABS: 4.0000 mg | ORAL_TABLET | Freq: Three times a day (TID) | ORAL | 0 refills | Status: DC | PRN

## 2017-02-06 MED ORDER — AZITHROMYCIN 250 MG PO TABS: ORAL_TABLET | ORAL | 0 refills | Status: DC

## 2017-02-06 NOTE — Telephone Encounter (Signed)
Pt needs meds for travel

## 2017-02-06 NOTE — Telephone Encounter (Signed)
Ok let her know I am sending a rx for zpack,, zofran for n/v; she can pick up immodium ad otc for diarrhea, I would normally give a rx for cipro but she is allergic to it

## 2017-02-06 NOTE — Telephone Encounter (Signed)
Ok, does she need any other travel meds?

## 2017-02-06 NOTE — Telephone Encounter (Signed)
Needs diarrhea medication  also sent to [place]

## 2017-04-15 DIAGNOSIS — Z79899 Other long term (current) drug therapy: Secondary | ICD-10-CM | POA: Diagnosis not present

## 2017-04-15 DIAGNOSIS — E7800 Pure hypercholesterolemia, unspecified: Secondary | ICD-10-CM | POA: Diagnosis not present

## 2017-04-18 DIAGNOSIS — F5101 Primary insomnia: Secondary | ICD-10-CM | POA: Diagnosis not present

## 2017-04-18 DIAGNOSIS — Z780 Asymptomatic menopausal state: Secondary | ICD-10-CM | POA: Diagnosis not present

## 2017-04-18 DIAGNOSIS — Z79899 Other long term (current) drug therapy: Secondary | ICD-10-CM | POA: Diagnosis not present

## 2017-04-18 DIAGNOSIS — Z0000 Encounter for general adult medical examination without abnormal findings: Secondary | ICD-10-CM | POA: Diagnosis not present

## 2017-04-19 ENCOUNTER — Other Ambulatory Visit: Payer: Self-pay | Admitting: Family Medicine

## 2017-04-19 DIAGNOSIS — Z1231 Encounter for screening mammogram for malignant neoplasm of breast: Secondary | ICD-10-CM

## 2017-04-30 DIAGNOSIS — M8588 Other specified disorders of bone density and structure, other site: Secondary | ICD-10-CM | POA: Diagnosis not present

## 2017-05-20 ENCOUNTER — Ambulatory Visit
Admission: RE | Admit: 2017-05-20 | Discharge: 2017-05-20 | Disposition: A | Discharge status: Home / Self Care | Payer: 59 | Source: Ambulatory Visit | Attending: Family Medicine | Admitting: Family Medicine

## 2017-05-20 DIAGNOSIS — Z1231 Encounter for screening mammogram for malignant neoplasm of breast: Secondary | ICD-10-CM | POA: Insufficient documentation

## 2017-07-04 DIAGNOSIS — D225 Melanocytic nevi of trunk: Secondary | ICD-10-CM | POA: Diagnosis not present

## 2017-07-04 DIAGNOSIS — D2261 Melanocytic nevi of right upper limb, including shoulder: Secondary | ICD-10-CM | POA: Diagnosis not present

## 2017-07-04 DIAGNOSIS — L821 Other seborrheic keratosis: Secondary | ICD-10-CM | POA: Diagnosis not present

## 2017-07-04 DIAGNOSIS — D2272 Melanocytic nevi of left lower limb, including hip: Secondary | ICD-10-CM | POA: Diagnosis not present

## 2017-08-29 ENCOUNTER — Ambulatory Visit: Payer: 59

## 2017-10-23 ENCOUNTER — Other Ambulatory Visit: Payer: Self-pay | Admitting: Family Medicine

## 2017-10-23 DIAGNOSIS — Z1231 Encounter for screening mammogram for malignant neoplasm of breast: Secondary | ICD-10-CM

## 2017-11-20 ENCOUNTER — Ambulatory Visit
Admission: RE | Admit: 2017-11-20 | Discharge: 2017-11-20 | Disposition: A | Discharge status: Home / Self Care | Payer: No Typology Code available for payment source | Source: Ambulatory Visit | Attending: Family Medicine | Admitting: Family Medicine

## 2017-11-20 DIAGNOSIS — Z1231 Encounter for screening mammogram for malignant neoplasm of breast: Secondary | ICD-10-CM | POA: Insufficient documentation

## 2018-04-22 ENCOUNTER — Other Ambulatory Visit: Payer: Self-pay | Admitting: Family Medicine

## 2018-04-22 DIAGNOSIS — Z1231 Encounter for screening mammogram for malignant neoplasm of breast: Secondary | ICD-10-CM

## 2018-04-30 DIAGNOSIS — M1712 Unilateral primary osteoarthritis, left knee: Secondary | ICD-10-CM | POA: Insufficient documentation

## 2018-05-23 ENCOUNTER — Ambulatory Visit
Admission: RE | Admit: 2018-05-23 | Discharge: 2018-05-23 | Disposition: A | Discharge status: Home / Self Care | Payer: No Typology Code available for payment source | Source: Ambulatory Visit | Attending: Family Medicine | Admitting: Family Medicine

## 2018-05-23 DIAGNOSIS — Z1231 Encounter for screening mammogram for malignant neoplasm of breast: Secondary | ICD-10-CM | POA: Diagnosis not present

## 2018-10-18 MED FILL — raNITIdine HCL 150 MG TABS: 150 | 90 days supply | Qty: 180 | Fill #0

## 2018-10-18 MED FILL — FLUTICASONE PROP 50 MCG SPR: 50 | 90 days supply | Qty: 48 | Fill #0

## 2018-10-18 MED FILL — ESTRADIOL 10 MCG TABS: 10 | 84 days supply | Qty: 24 | Fill #0

## 2018-10-18 MED FILL — AZELASTINE HCL 137 MCG SPRY: 0.1 | 90 days supply | Qty: 90 | Fill #0

## 2019-03-17 ENCOUNTER — Other Ambulatory Visit: Payer: Self-pay | Admitting: Family Medicine

## 2019-03-17 DIAGNOSIS — Z1231 Encounter for screening mammogram for malignant neoplasm of breast: Secondary | ICD-10-CM

## 2019-05-26 ENCOUNTER — Ambulatory Visit
Admission: RE | Admit: 2019-05-26 | Discharge: 2019-05-26 | Disposition: A | Discharge status: Home / Self Care | Payer: No Typology Code available for payment source | Source: Ambulatory Visit | Attending: Family Medicine | Admitting: Family Medicine

## 2019-05-26 DIAGNOSIS — Z1231 Encounter for screening mammogram for malignant neoplasm of breast: Secondary | ICD-10-CM | POA: Diagnosis not present

## 2019-09-17 ENCOUNTER — Other Ambulatory Visit: Payer: Self-pay | Admitting: Family Medicine

## 2019-09-21 ENCOUNTER — Ambulatory Visit: Payer: Medicare PPO | Attending: Orthopedic Surgery

## 2019-09-21 ENCOUNTER — Other Ambulatory Visit: Payer: Self-pay

## 2019-09-21 DIAGNOSIS — G8929 Other chronic pain: Secondary | ICD-10-CM | POA: Diagnosis present

## 2019-09-21 DIAGNOSIS — M545 Low back pain: Secondary | ICD-10-CM | POA: Diagnosis present

## 2019-09-21 DIAGNOSIS — M25552 Pain in left hip: Secondary | ICD-10-CM | POA: Diagnosis not present

## 2019-09-21 DIAGNOSIS — M5417 Radiculopathy, lumbosacral region: Secondary | ICD-10-CM | POA: Diagnosis present

## 2019-09-21 DIAGNOSIS — M6281 Muscle weakness (generalized): Secondary | ICD-10-CM | POA: Diagnosis present

## 2019-09-21 NOTE — Therapy (Signed)
[place] [facility] [address], [place], [address] Phone: [phone]   Fax:  [phone]  Physical Therapy Evaluation  Patient Details  Name: Patricia Oliver MRN: [id number] Date of Birth: 1951-04-28 Referring Provider (PT): Marlowe Sax [place], [place]   Encounter Date: 09/21/2019  PT End of Session - 09/21/19 1520    Visit Number  1    Number of Visits  13    Date for PT Re-Evaluation  11/05/19    Authorization Type  1    Authorization Time Period  of 10 Progress report    PT Start Time  1520    PT Stop Time  1641    PT Time Calculation (min)  81 min    Activity Tolerance  Patient tolerated treatment well    Behavior During Therapy  [facility] for tasks assessed/performed       Past Medical History:  Diagnosis Date  . Dysrhythmia   . GERD (gastroesophageal reflux disease)   . Hyperlipidemia     Past Surgical History:  Procedure Laterality Date  . ABDOMINAL HYSTERECTOMY    . APPENDECTOMY    . BREAST BIOPSY Left 1996   neg  . COLONOSCOPY N/A 12/10/2014   Procedure: COLONOSCOPY;  Surgeon: Christena Deem, MD;  Location: [facility] ENDOSCOPY;  Service: Endoscopy;  Laterality: N/A;  . ORIF ANKLE FRACTURE      There were no vitals filed for this visit.   Subjective Assessment - 09/21/19 1525    Subjective  L Low back pain: 4/10 currently (low back and posterior buttock), 9/10 at most for the past 3 months (ot sleeping); L LE symptoms: 0/10 currently, 6/10 at at most for the past 3 months (pt sleeping)    Pertinent History  Back and LE pain. Pt helped her niece move and pt felt symptoms 2 years ago. Pt was driving home from [place] and pt felt her pain. The cortisone shot helped. 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 Had an X-ray for her low back which revealed degenerating disc. Was given prednisone and muscles relaxers. Does not  know if prednisone helped. Feels L LE weakness such as when going down steps. Feels like L LE might buckle. Pain is better with movement. Pt works 12-15 hours a week. Pt tries not to sit for more than 2 hours without getting up. Denies loss of bowel or bladder control or saddle anesthesia. Pt is a side (R side) and stomach sleeper.    Patient Stated Goals  Be able to control the pain a little better    Currently in Pain?  Yes    Pain Score  4     Pain Location  Back    Pain Orientation  Left    Pain Descriptors / Indicators  Aching    Pain Type  Chronic pain    Pain Radiating Towards  L LE around L5/S1 dermatome, usually does not go past the knee.    Pain Onset  More than a month ago    Pain Frequency  Occasional    Aggravating Factors   Prolonged sitting (about 5 hours sitting), L S/L    Pain Relieving Factors  sitting on a pillow under L hip, standing, walking         [facility] PT Assessment - 09/21/19 1519      Assessment   Medical Diagnosis  L sided  low back pain with L sided sciatica; Lumbar DDD    Referring Provider (PT)  Edilia Bo, [place]    Onset Date/Surgical Date  09/11/19    Prior Therapy  for R ankle      Precautions   Precaution Comments  no known precautions      Restrictions   Other Position/Activity Restrictions  No known restrictions      Balance Screen   Has the patient fallen in the past 6 months  No    Has the patient had a decrease in activity level because of a fear of falling?   No    Is the patient reluctant to leave their home because of a fear of falling?   No      Home Environment   Additional Comments  Pt live in a one story home with husband. 2 steps front door, R rail, 2 steps back door, R rail.       Prior Function   Vocation  Part time employment   Desk job   Vocation Requirements  PLOF: no pain with prolonged sitting      Observation/Other Assessments   Focus on Therapeutic Outcomes (FOTO)   Lumbar FOTO 82      Posture/Postural Control    Posture Comments  R shoulder lower, protracted neck, R iliac crest slightly higher, L foot pronation > R. L lateral shift, L lumbothoracic convexity.       AROM   Overall AROM Comments  back extension: to the L: reproduced symptoms; to the R: no symptoms    Lumbar Flexion  WFL    Lumbar Extension  WFL    Lumbar - Right Side Bend  WFL    Lumbar - Left Side Bend  WFL    Lumbar - Right Rotation  WFL    Lumbar - Left Rotation  WFL      PROM   Overall PROM Comments  hip IR at 90/90: L 38 degrees, R 52 degrees    Prone B hip IR: WFL, no pain     Strength   Right Hip Flexion  4/5    Right Hip Extension  3+/5    Right Hip ABduction  4/5    Left Hip Flexion  4/5    Left Hip Extension  4-/5    Left Hip ABduction  4-/5    Right Knee Flexion  4+/5    Right Knee Extension  5/5    Left Knee Flexion  4/5    Left Knee Extension  4+/5      Palpation   Palpation comment  Good lumbar mobility with CPA and R and L UPA. decreased mobility lower and mid thoracic spine with CPA. TTP L piriformis muscle      Special Tests   Other special tests  (-) repeated flexion test, (+) Slump test L LE and R LE; (-) piriformis test L LE;  (-) long sit test      Ambulation/Gait   Gait Comments  decreased stance L LE, R lateral lean during R LE stance phase                Objective measurements completed on examination: See above findings.   No latex band allergies    Sitting posture L lateral lean  Medbridge Access Code: [email or web address]  Therapeutic exercise  Sitting with lumbar support with extension. Decreased L buttock pain.    supine L hip IR stretch with PT 30 seconds x 3  Seated  L hip IR 10x3  Seated R hip extension isometrics 10x2 with 5 second holds. Decreased L lateral shift posture in sitting observed.     Improved exercise technique, movement at target joints, use of target muscles after mod verbal, visual, tactile cues.    Response to treatment Decreased L posterior hip  pain with seated lumbar extension as well as decreasing L lateral shift position   Clinical impression  Patient is a 69 year old female who came to physical therapy secondary to L low back, posterior hip and thigh pain. 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 Pt will benefit from skilled physical therapy services to address the aforementioned deficits.    .     PT Education - 09/21/19 1711    Education Details  ther-ex. HEP, plan of care    Person(s) Educated  Patient    Methods  Explanation;Demonstration;Tactile cues;Verbal cues;Handout    Comprehension  Returned demonstration;Verbalized understanding       PT Short Term Goals - 09/21/19 1657      PT SHORT TERM GOAL #1   Title  Patient will be independent with her HEP to promote ability to perform work duties as well as to be able to drive longer distances more comfortably.    Baseline  Pt has started her HEP (09/21/2019)    Time  3    Period  Weeks    Status  New    Target Date  10/15/19        PT Long Term Goals - 09/21/19 1658      PT LONG TERM GOAL #1   Title  Patient will have a decrease in L low back and posterior hip pain to 3/10 or less at most to promote ability to perform her work duties as well as be able to drive longer distances more comfortably.    Baseline  9/10 low back/posterior L hip pain at most for the past 3 months (09/21/2019)    Time  6    Period  Weeks    Status  New    Target Date  11/05/19      PT LONG TERM GOAL #2   Title  Patient will improve bilateral hip extension and abduction strength by at least 1/2 MMT grade to promote ability to perform functional tasks more comfortably.    Time  6    Period  Weeks    Status  New    Target Date  11/05/19      PT LONG TERM GOAL #3   Title  Pt  will improve L hip IR PROM to eaqual her R to promote ability to sit more comfortably.    Baseline  L 38 degrees, R 52 degrees (09/21/2019)    Time  6    Period  Weeks    Status  New    Target Date  11/05/19             Plan - 09/21/19 1653    Clinical Impression Statement  Patient is a 69 year old female who came to physical therapy secondary to L low back, posterior hip and thigh pain. She also presents with L lateral shift posture, extension preference, bilateral glute med and max weakness, neural tension along the sciatic distribution, L hip stiffness and difficulty tolerating prolonged sitting for work  and long distance driving. Pt will benefit from skilled physical therapy services to address the aforementioned deficits.    Personal Factors and Comorbidities  Age;Past/Current Experience;Time since onset of injury/illness/exacerbation;Profession   profession: desk job   Examination-Activity Limitations  Sit    Stability/Clinical Decision Making  Stable/Uncomplicated    Clinical Decision Making  Low    Rehab Potential  Fair    PT Frequency  2x / week    PT Duration  6 weeks    PT Treatment/Interventions  Therapeutic activities;Therapeutic exercise;Functional mobility training;Neuromuscular re-education;Patient/family education;Manual techniques;Dry needling;Joint Manipulations;Spinal Manipulations;Aquatic Therapy;Electrical Stimulation;Iontophoresis 4mg /ml Dexamethasone;Traction;Ultrasound    PT Next Visit Plan  hip ROM, glute, trunk strengthening, lumbopelvic/femoral control, posture, manual techniques, modalities PRN    PT Home Exercise Plan  Medbridge Access Code: [email or web address]    Consulted and Agree with Plan of Care  Patient       Patient will benefit from skilled therapeutic intervention in order to improve the following deficits and impairments:  Pain, Postural dysfunction, Improper body mechanics, Decreased strength  Visit Diagnosis: Pain in left hip - Plan: PT plan of care  cert/re-cert  Chronic left-sided low back pain, unspecified whether sciatica present - Plan: PT plan of care cert/re-cert  Radiculopathy, lumbosacral region - Plan: PT plan of care cert/re-cert  Muscle weakness (generalized) - Plan: PT plan of care cert/re-cert     Problem List Patient Active Problem List   Diagnosis Date Noted  . Allergic rhinitis 11/19/2013  . Esophageal reflux 11/19/2013  . Other and unspecified hyperlipidemia 11/19/2013    Loralyn Freshwater PT, DPT   09/21/2019, 5:16 PM  [place] [facility] PHYSICAL AND SPORTS MEDICINE [address] [place], [address] Phone: [phone]   Fax:  [phone]  Name: JENNIFER KNUEPPEL MRN: [id number] Date of Birth: 01-Nov-1950

## 2019-09-21 NOTE — Patient Instructions (Addendum)
Seated hip extension isometrics   Sitting on a chair,    Squeeze your rear end muscles together and press your rigth foot onto the floor.     Hold for 5 seconds    Repeat 10 times   Perform 3 sets daily.     Perform 2-3 sessions per day     This is a corrective exercise. Once you no longer have symptoms consistenly, you can stop.     Access Code: [email or web address]  URL: [email or web address]  Date: 09/21/2019  Prepared by: Joneen Boers   Exercises Seated Hip Internal Rotation AROM - 10 reps - 3 sets - 3x daily - 7x weekly

## 2019-09-23 ENCOUNTER — Other Ambulatory Visit: Payer: Self-pay

## 2019-09-23 ENCOUNTER — Ambulatory Visit: Payer: Medicare PPO

## 2019-09-23 DIAGNOSIS — M25552 Pain in left hip: Secondary | ICD-10-CM

## 2019-09-23 DIAGNOSIS — G8929 Other chronic pain: Secondary | ICD-10-CM

## 2019-09-23 DIAGNOSIS — M6281 Muscle weakness (generalized): Secondary | ICD-10-CM

## 2019-09-23 DIAGNOSIS — M5417 Radiculopathy, lumbosacral region: Secondary | ICD-10-CM

## 2019-09-23 NOTE — Therapy (Signed)
[place] [facility]INE [address], [place], [address] Phone: [phone]   Fax:  [phone]  Physical Therapy Evaluation  Patient Details  Name: Patricia Oliver MRN: [id number] Date of Birth: August 01, 1950 Referring Provider (PT): Marlowe Sax [place], [place]   Encounter Date: 09/23/2019  PT End of Session - 09/23/19 1706    Visit Number  2    Number of Visits  13    Date for PT Re-Evaluation  11/05/19    Authorization Type  2    Authorization Time Period  of 10 Progress report    PT Start Time  1706    PT Stop Time  1748    PT Time Calculation (min)  42 min    Activity Tolerance  Patient tolerated treatment well    Behavior During Therapy  [facility] for tasks assessed/performed       Past Medical History:  Diagnosis Date  . Dysrhythmia   . GERD (gastroesophageal reflux disease)   . Hyperlipidemia     Past Surgical History:  Procedure Laterality Date  . ABDOMINAL HYSTERECTOMY    . APPENDECTOMY    . BREAST BIOPSY Left 1996   neg  . COLONOSCOPY N/A 12/10/2014   Procedure: COLONOSCOPY;  Surgeon: Christena Deem, MD;  Location: [facility] ENDOSCOPY;  Service: Endoscopy;  Laterality: N/A;  . ORIF ANKLE FRACTURE      There were no vitals filed for this visit.   Subjective Assessment - 09/23/19 1707    Subjective  L low back is a little sore yesterday but it worked its way out. Its about the same. Has been doing her exercises. 4/10 currently.    Pertinent History  Back and LE pain. Pt helped her niece move and pt felt symptoms 2 years ago. Pt was driving home from [place] and pt felt her pain. The cortisone shot helped. 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 Had an X-ray for her low back which revealed degenerating disc. Was given prednisone and muscles relaxers. Does not know if prednisone helped. Feels L LE weakness such as when going down  steps. Feels like L LE might buckle. Pain is better with movement. Pt works 12-15 hours a week. Pt tries not to sit for more than 2 hours without getting up. Denies loss of bowel or bladder control or saddle anesthesia. Pt is a side (R side) and stomach sleeper.    Patient Stated Goals  Be able to control the pain a little better    Currently in Pain?  Yes    Pain Score  4     Pain Onset  More than a month ago                                  PT Education - 09/23/19 1739    Education Details  ther-ex, HEP    Methods  Explanation;Demonstration;Tactile cues;Verbal cues;Handout    Comprehension  Verbalized understanding;Returned demonstration         Objective    No latex band allergies    Sitting posture L lateral lean  Medbridge Access Code: [email or web address]  Therapeutic exercise   Seated R hip extension isometrics 10x2 with 10 second holds. Decreased L lateral shift posture in sitting observed   Standing L lateral shift correction 10x5 seconds for 2 sets  Given as part of HEP. Pt demonstrated and verbalized understanding.   Sitting on R hip to decrease L lateral shift posture in sitting.    Seated manually resisted R lateral shift in neutral to counter L lateral shift 10x3 with 5 second holds  No L hip pain afterwards  Decreased L lumbar paraspinal muscle tesnion   Sit <> stand with emphasis on R hip extension use 10x  Then with arms folded 10x2  Prone on elbows 3 minutes with B hip IR   Prone glute max set 10x5 seconds, 10x10 seconds  Supine hip IR PROM with PT 10x3    Improved exercise technique, movement at target joints, use of target muscles after min to mod verbal, visual, tactile cues.    Response to treatment No pain after session.    Clinical impression Decreased L hip pain with decreasing L lateral shift posture. No pain after session reported by pt. Decreased L lumbar paraspinal muscle tension palpated with  exercises. Pt will benefit from continued skilled physical therapy services to decrease pain, improve strength and function.       PT Short Term Goals - 09/21/19 1657      PT SHORT TERM GOAL #1   Title  Patient will be independent with her HEP to promote ability to perform work duties as well as to be able to drive longer distances more comfortably.    Baseline  Pt has started her HEP (09/21/2019)    Time  3    Period  Weeks    Status  New    Target Date  10/15/19        PT Long Term Goals - 09/21/19 1658      PT LONG TERM GOAL #1   Title  Patient will have a decrease in L low back and posterior hip pain to 3/10 or less at most to promote ability to perform her work duties as well as be able to drive longer distances more comfortably.    Baseline  9/10 low back/posterior L hip pain at most for the past 3 months (09/21/2019)    Time  6    Period  Weeks    Status  New    Target Date  11/05/19      PT LONG TERM GOAL #2   Title  Patient will improve bilateral hip extension and abduction strength by at least 1/2 MMT grade to promote ability to perform functional tasks more comfortably.    Time  6    Period  Weeks    Status  New    Target Date  11/05/19      PT LONG TERM GOAL #3   Title  Pt will improve L hip IR PROM to eaqual her R to promote ability to sit more comfortably.    Baseline  L 38 degrees, R 52 degrees (09/21/2019)    Time  6    Period  Weeks    Status  New    Target Date  11/05/19             Plan - 09/23/19 1932    Clinical Impression Statement  Decreased L hip pain with decreasing L lateral shift posture. No pain after session reported by pt. Decreased L lumbar paraspinal muscle tension palpated with exercises. Pt will benefit from continued skilled physical therapy services to decrease pain, improve strength and function.    Personal Factors and Comorbidities  Age;Past/Current Experience;Time since onset of injury/illness/exacerbation;Profession    profession: desk  job   Examination-Activity Limitations  Sit    Stability/Clinical Decision Making  Stable/Uncomplicated    Rehab Potential  Fair    PT Frequency  2x / week    PT Duration  6 weeks    PT Treatment/Interventions  Therapeutic activities;Therapeutic exercise;Functional mobility training;Neuromuscular re-education;Patient/family education;Manual techniques;Dry needling;Joint Manipulations;Spinal Manipulations;Aquatic Therapy;Electrical Stimulation;Iontophoresis 4mg /ml Dexamethasone;Traction;Ultrasound    PT Next Visit Plan  hip ROM, glute, trunk strengthening, lumbopelvic/femoral control, posture, manual techniques, modalities PRN    PT Home Exercise Plan  Medbridge Access Code: [email or web address]    Consulted and Agree with Plan of Care  Patient       Patient will benefit from skilled therapeutic intervention in order to improve the following deficits and impairments:  Pain, Postural dysfunction, Improper body mechanics, Decreased strength  Visit Diagnosis: Chronic left-sided low back pain, unspecified whether sciatica present  Pain in left hip  Radiculopathy, lumbosacral region  Muscle weakness (generalized)     Problem List Patient Active Problem List   Diagnosis Date Noted  . Allergic rhinitis 11/19/2013  . Esophageal reflux 11/19/2013  . Other and unspecified hyperlipidemia 11/19/2013    Loralyn Freshwater PT, DPT   09/23/2019, 7:38 PM  [place] [facility] PHYSICAL AND SPORTS MEDICINE [address], [place], [address] Phone: [phone]   Fax:  [phone]  Name: Patricia Oliver MRN: [id number] Date of Birth: 10/19/1950

## 2019-09-23 NOTE — Patient Instructions (Addendum)
Access Code: [email or web address]  URL: [email or web address]  Date: 09/23/2019  Prepared by: Joneen Boers   Exercises Seated Hip Internal Rotation AROM - 10 reps - 3 sets - 3x daily - 7x weekly Static Prone on Elbows - 2 reps - 1 sets - 3 minutes hold - 3x daily                            - 7x weekly Prone Quadriceps Set - 10 reps - 3 sets - 10 seconds hold - 1x daily - 7x weekly     Standing L lateral shift correction 10x5 seconds for 2 sets   Given as part of HEP. Pt demonstrated and verbalized understanding.   Pt was also recommended to sit on her R hip to decrease L lateral lean poster. Pt demonstrated and verbalized understanding.

## 2019-09-28 ENCOUNTER — Ambulatory Visit: Payer: Medicare PPO

## 2019-09-28 ENCOUNTER — Other Ambulatory Visit: Payer: Self-pay

## 2019-09-28 DIAGNOSIS — G8929 Other chronic pain: Secondary | ICD-10-CM

## 2019-09-28 DIAGNOSIS — M25552 Pain in left hip: Secondary | ICD-10-CM

## 2019-09-28 DIAGNOSIS — M6281 Muscle weakness (generalized): Secondary | ICD-10-CM

## 2019-09-28 DIAGNOSIS — M545 Low back pain, unspecified: Secondary | ICD-10-CM

## 2019-09-28 DIAGNOSIS — M5417 Radiculopathy, lumbosacral region: Secondary | ICD-10-CM

## 2019-09-28 NOTE — Therapy (Signed)
[facility] [address], [place], [address] Phone: [phone]   Fax:  [phone]  Physical Therapy Treatment  Patient Details  Name: Patricia Oliver MRN: [id number] Date of Birth: Jan 17, 1951 Referring Provider (PT): Martyn Malay [place], [place]   Encounter Date: 09/28/2019  PT End of Session - 09/28/19 1705    Visit Number  3    Number of Visits  13    Date for PT Re-Evaluation  11/05/19    Authorization Type  3    Authorization Time Period  of 10 Progress report    PT Start Time  1705    PT Stop Time  1745    PT Time Calculation (min)  40 min    Activity Tolerance  Patient tolerated treatment well    Behavior During Therapy  [facility] for tasks assessed/performed       Past Medical History:  Diagnosis Date  . Dysrhythmia   . GERD (gastroesophageal reflux disease)   . Hyperlipidemia     Past Surgical History:  Procedure Laterality Date  . ABDOMINAL HYSTERECTOMY    . APPENDECTOMY    . BREAST BIOPSY Left 1996   neg  . COLONOSCOPY N/A 12/10/2014   Procedure: COLONOSCOPY;  Surgeon: Lollie Sails, MD;  Location: [facility] ENDOSCOPY;  Service: Endoscopy;  Laterality: N/A;  . ORIF ANKLE FRACTURE      There were no vitals filed for this visit.  Subjective Assessment - 09/28/19 1706    Subjective  Low back and L side is pretty good. L leg did not feel as weak. Has been doing her exercises. No pain currently.    Pertinent History  Back and LE pain. Pt helped her niece move and pt felt symptoms 2 years ago. Pt was driving home from [place] and pt felt her pain. The cortisone shot helped. 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 Had an X-ray for her low back which revealed degenerating disc. Was given prednisone and muscles relaxers. Does not know if prednisone helped. Feels L LE weakness such as when going down steps. Feels like L  LE might buckle. Pain is better with movement. Pt works 12-15 hours a week. Pt tries not to sit for more than 2 hours without getting up. Denies loss of bowel or bladder control or saddle anesthesia. Pt is a side (R side) and stomach sleeper.    Patient Stated Goals  Be able to control the pain a little better    Currently in Pain?  No/denies    Pain Score  0-No pain    Pain Onset  More than a month ago                               PT Education - 09/28/19 1712    Education Details  ther-ex    Person(s) Educated  Patient    Methods  Explanation;Demonstration;Tactile cues;Verbal cues    Comprehension  Returned demonstration;Verbalized understanding      Objective    No latex band allergies    Sitting posture L lateral lean  MedbridgeAccess Code: [email or web address]  Therapeutic exercise   Prone glute max set 10x5 seconds, 10x10 seconds  Prone glute max extension   R 10x2  L 10x2  Decreased R lumbar paraspinal muscle tension palpated with activation of transversus abdominal muscles  Experience;Time  since onset of injury/illness/exacerbation;Profession   profession: desk job   Examination-Activity Limitations  Sit    Stability/Clinical Decision Making  Stable/Uncomplicated    Rehab Potential  Fair    PT Frequency  2x / week    PT Duration  6 weeks    PT Treatment/Interventions  Therapeutic activities;Therapeutic exercise;Functional mobility training;Neuromuscular re-education;Patient/family education;Manual techniques;Dry needling;Joint Manipulations;Spinal Manipulations;Aquatic Therapy;Electrical Stimulation;Iontophoresis 4mg /ml Dexamethasone;Traction;Ultrasound    PT Next Visit Plan  hip ROM, glute, trunk strengthening, lumbopelvic/femoral control, posture, manual techniques, modalities PRN    PT Home Exercise Plan  Medbridge Access Code: [email or web address]    Consulted and Agree with Plan of Care  Patient       Patient will benefit from skilled therapeutic intervention in order to improve the following deficits and impairments:  Pain, Postural dysfunction, Improper body mechanics, Decreased strength  Visit Diagnosis: Chronic left-sided low back pain, unspecified whether sciatica present  Pain in left hip  Radiculopathy, lumbosacral region  Muscle weakness (generalized)     Problem List Patient Active Problem List   Diagnosis Date Noted  . Allergic rhinitis 11/19/2013  . Esophageal reflux 11/19/2013  . Other and unspecified hyperlipidemia 11/19/2013    Joneen Boers PT, DPT   09/28/2019, 7:39 PM  [facility] [address] [place], [address] Phone: [phone]   Fax:  [phone]  Name: Patricia Oliver MRN: [id number] Date of Birth: 09-06-1950  Experience;Time  since onset of injury/illness/exacerbation;Profession   profession: desk job   Examination-Activity Limitations  Sit    Stability/Clinical Decision Making  Stable/Uncomplicated    Rehab Potential  Fair    PT Frequency  2x / week    PT Duration  6 weeks    PT Treatment/Interventions  Therapeutic activities;Therapeutic exercise;Functional mobility training;Neuromuscular re-education;Patient/family education;Manual techniques;Dry needling;Joint Manipulations;Spinal Manipulations;Aquatic Therapy;Electrical Stimulation;Iontophoresis 4mg /ml Dexamethasone;Traction;Ultrasound    PT Next Visit Plan  hip ROM, glute, trunk strengthening, lumbopelvic/femoral control, posture, manual techniques, modalities PRN    PT Home Exercise Plan  Medbridge Access Code: [email or web address]    Consulted and Agree with Plan of Care  Patient       Patient will benefit from skilled therapeutic intervention in order to improve the following deficits and impairments:  Pain, Postural dysfunction, Improper body mechanics, Decreased strength  Visit Diagnosis: Chronic left-sided low back pain, unspecified whether sciatica present  Pain in left hip  Radiculopathy, lumbosacral region  Muscle weakness (generalized)     Problem List Patient Active Problem List   Diagnosis Date Noted  . Allergic rhinitis 11/19/2013  . Esophageal reflux 11/19/2013  . Other and unspecified hyperlipidemia 11/19/2013    Joneen Boers PT, DPT   09/28/2019, 7:39 PM  [facility] [address] [place], [address] Phone: [phone]   Fax:  [phone]  Name: Patricia Oliver MRN: [id number] Date of Birth: 09-06-1950

## 2019-09-30 ENCOUNTER — Other Ambulatory Visit: Payer: Self-pay

## 2019-09-30 ENCOUNTER — Encounter: Payer: Self-pay | Admitting: Physical Therapy

## 2019-09-30 ENCOUNTER — Ambulatory Visit: Payer: Medicare PPO | Admitting: Physical Therapy

## 2019-09-30 DIAGNOSIS — M5417 Radiculopathy, lumbosacral region: Secondary | ICD-10-CM

## 2019-09-30 DIAGNOSIS — M25552 Pain in left hip: Secondary | ICD-10-CM

## 2019-09-30 DIAGNOSIS — G8929 Other chronic pain: Secondary | ICD-10-CM

## 2019-09-30 DIAGNOSIS — M6281 Muscle weakness (generalized): Secondary | ICD-10-CM

## 2019-09-30 DIAGNOSIS — M545 Low back pain, unspecified: Secondary | ICD-10-CM

## 2019-09-30 NOTE — Therapy (Signed)
[facility] [address] [place], [address] Phone: [phone]   Fax:  [phone]  Physical Therapy Treatment  Patient Details  Name: Patricia Oliver MRN: [id number] Date of Birth: Jul 10, 1951 Referring Provider (PT): Marlowe Sax [place], [place]   Encounter Date: 09/30/2019  PT End of Session - 09/30/19 1513    Visit Number  4    Number of Visits  13    Date for PT Re-Evaluation  11/05/19    Authorization Type  4    Authorization Time Period  of 10 Progress report    PT Start Time  1435    PT Stop Time  1520    PT Time Calculation (min)  45 min    Activity Tolerance  Patient tolerated treatment well    Behavior During Therapy  [facility] for tasks assessed/performed       Past Medical History:  Diagnosis Date  . Dysrhythmia   . GERD (gastroesophageal reflux disease)   . Hyperlipidemia     Past Surgical History:  Procedure Laterality Date  . ABDOMINAL HYSTERECTOMY    . APPENDECTOMY    . BREAST BIOPSY Left 1996   neg  . COLONOSCOPY N/A 12/10/2014   Procedure: COLONOSCOPY;  Surgeon: Christena Deem, MD;  Location: [facility] ENDOSCOPY;  Service: Endoscopy;  Laterality: N/A;  . ORIF ANKLE FRACTURE      There were no vitals filed for this visit.  Subjective Assessment - 09/30/19 1436    Subjective  Patinet reports she is feeling well today with no pain upon arrival. She has been busy today picking up some sticks. Reports slight soreness following last treatment session but nothing significant. More the next day in the muscles. HEP is going well. Her wosrt pain is when she is sititng too long.    Pertinent History  Back and LE pain. Pt helped her niece move and pt felt symptoms 2 years ago. Pt was driving home from [place] and pt felt her pain. The cortisone shot helped. 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 Had an X-ray for  her low back which revealed degenerating disc. Was given prednisone and muscles relaxers. Does not know if prednisone helped. Feels L LE weakness such as when going down steps. Feels like L LE might buckle. Pain is better with movement. Pt works 12-15 hours a week. Pt tries not to sit for more than 2 hours without getting up. Denies loss of bowel or bladder control or saddle anesthesia. Pt is a side (R side) and stomach sleeper.    Patient Stated Goals  Be able to control the pain a little better    Currently in Pain?  No/denies    Pain Onset  More than a month ago        OBJECTIVE No latex band allergies   MedbridgeAccess Code: [email or web address]  Therapeutic exercise: to centralize symptoms and improve ROM, strength, muscular endurance, and activity tolerance required for successful completion of functional activities.   Prone glute max set 10x5 seconds, 10x10 seconds  Prone glute max extension (first set with knee flexed to bias glute max, second set with knee extended and head resting on arms with improved focus on lumbar control and abdominal contraction)             R 10x2  L 10x2             Decreased R lumbar paraspinal muscle tension palpated with activation of transversus abdominal muscles when knee extended.   Supine hip IR (below 90 degrees flexion) PROM with PT              R x20 with 5 second hold  L x20 with 5 second hold  Running man with no UE assist and one foot on furniture slider to promote glute muscle strengthening             3x10 each side  standing back extension 10x5 seconds for 2 sets   Standing PNF chops (with lumbar rotation) with double red band, anchored above head x20 each side  Standing B low rows red band (anchored at waist) 11 x 1 second, and 22 x5 seconds   Seated L hip IR (feet off floor) 10x5 seconds for 2 sets   Static mini forward lunge\  to promote glute max strengthening 10x2 each side.  Improved exercise technique,  movement at target joints, use of target muscles aftermin tomod verbal, visual, tactile cues.   PT Education - 09/30/19 1521    Education Details  exercise purpose/form.    Person(s) Educated  Patient    Methods  Explanation;Demonstration;Tactile cues;Verbal cues    Comprehension  Verbalized understanding;Returned demonstration;Verbal cues required;Tactile cues required;Need further instruction       PT Short Term Goals - 09/21/19 1657      PT SHORT TERM GOAL #1   Title  Patient will be independent with her HEP to promote ability to perform work duties as well as to be able to drive longer distances more comfortably.    Baseline  Pt has started her HEP (09/21/2019)    Time  3    Period  Weeks    Status  New    Target Date  10/15/19        PT Long Term Goals - 09/21/19 1658      PT LONG TERM GOAL #1   Title  Patient will have a decrease in L low back and posterior hip pain to 3/10 or less at most to promote ability to perform her work duties as well as be able to drive longer distances more comfortably.    Baseline  9/10 low back/posterior L hip pain at most for the past 3 months (09/21/2019)    Time  6    Period  Weeks    Status  New    Target Date  11/05/19      PT LONG TERM GOAL #2   Title  Patient will improve bilateral hip extension and abduction strength by at least 1/2 MMT grade to promote ability to perform functional tasks more comfortably.    Time  6    Period  Weeks    Status  New    Target Date  11/05/19      PT LONG TERM GOAL #3   Title  Pt will improve L hip IR PROM to eaqual her R to promote ability to sit more comfortably.    Baseline  L 38 degrees, R 52 degrees (09/21/2019)    Time  6    Period  Weeks    Status  New    Target Date  11/05/19            Plan - 09/30/19 1520    Clinical Impression Statement  Patient tolerated treatment well overall with no increase in  symptoms throughout session. Occasionally felt tightness in the L LE during  repetition of exercise that resolved with rest. Continued working on LE glute max and core strength. Patient has improving pain but her symptoms are not fully reolved. Patient would benefit from continued management of limiting condition by skilled physical therapist to address remaining impairments and functional limitations to work towards stated goals and return to PLOF or maximal functional independence.    Personal Factors and Comorbidities  Age;Past/Current Experience;Time since onset of injury/illness/exacerbation;Profession   profession: desk job   Examination-Activity Limitations  Sit    Stability/Clinical Decision Making  Stable/Uncomplicated    Rehab Potential  Fair    PT Frequency  2x / week    PT Duration  6 weeks    PT Treatment/Interventions  Therapeutic activities;Therapeutic exercise;Functional mobility training;Neuromuscular re-education;Patient/family education;Manual techniques;Dry needling;Joint Manipulations;Spinal Manipulations;Aquatic Therapy;Electrical Stimulation;Iontophoresis 4mg /ml Dexamethasone;Traction;Ultrasound    PT Next Visit Plan  hip ROM, glute, trunk strengthening, lumbopelvic/femoral control, posture, manual techniques, modalities PRN    PT Home Exercise Plan  Medbridge Access Code: [email or web address]    Consulted and Agree with Plan of Care  Patient       Patient will benefit from skilled therapeutic intervention in order to improve the following deficits and impairments:  Pain, Postural dysfunction, Improper body mechanics, Decreased strength  Visit Diagnosis: Chronic left-sided low back pain, unspecified whether sciatica present  Pain in left hip  Radiculopathy, lumbosacral region  Muscle weakness (generalized)     Problem List Patient Active Problem List   Diagnosis Date Noted  . Allergic rhinitis 11/19/2013  . Esophageal reflux 11/19/2013  . Other and unspecified hyperlipidemia 11/19/2013    Luretha Murphy. Ilsa Iha, PT, DPT 09/30/19, 3:28 PM  [facility] [address], [place], [address] Phone: [phone]   Fax:  [phone]  Name: Patricia Oliver MRN: [id number] Date of Birth: [birth year]

## 2019-10-05 ENCOUNTER — Ambulatory Visit: Payer: Medicare PPO

## 2019-10-05 ENCOUNTER — Other Ambulatory Visit: Payer: Self-pay

## 2019-10-05 DIAGNOSIS — G8929 Other chronic pain: Secondary | ICD-10-CM

## 2019-10-05 DIAGNOSIS — M6281 Muscle weakness (generalized): Secondary | ICD-10-CM

## 2019-10-05 DIAGNOSIS — M5417 Radiculopathy, lumbosacral region: Secondary | ICD-10-CM

## 2019-10-05 DIAGNOSIS — M25552 Pain in left hip: Secondary | ICD-10-CM | POA: Diagnosis not present

## 2019-10-05 DIAGNOSIS — M545 Low back pain, unspecified: Secondary | ICD-10-CM

## 2019-10-05 NOTE — Therapy (Signed)
Clinical Impression Statement  Pt making very good progress with decreased L hip and LE pain and improved strength based on subjective reports. Continued working on decreasing L lateral shift posture,  promoting back extension, trunk and glute strength to continue progress. Pt tolerated session well without aggravation of symptoms. Pt will benefit from continued skilled physical therapy services to decrease pain, improve strength and function.    Personal Factors and Comorbidities  Age;Past/Current Experience;Time since onset of injury/illness/exacerbation;Profession   profession: desk job   Examination-Activity Limitations  Sit    Stability/Clinical Decision Making  Stable/Uncomplicated    Rehab Potential  Fair    PT Frequency  2x / week    PT Duration  6 weeks    PT Treatment/Interventions  Therapeutic activities;Therapeutic exercise;Functional mobility training;Neuromuscular re-education;Patient/family education;Manual techniques;Dry needling;Joint Manipulations;Spinal Manipulations;Aquatic Therapy;Electrical Stimulation;Iontophoresis 4mg /ml Dexamethasone;Traction;Ultrasound    PT Next Visit Plan  hip ROM, glute, trunk strengthening, lumbopelvic/femoral control, posture, manual techniques, modalities PRN    PT Home Exercise Plan  Medbridge Access Code: [email or web address]    Consulted and Agree with Plan of Care  Patient       Patient will benefit from skilled therapeutic intervention in order to improve the following deficits and impairments:  Pain, Postural dysfunction, Improper body mechanics, Decreased strength  Visit Diagnosis: Chronic left-sided low back pain, unspecified whether sciatica present  Pain in left hip  Radiculopathy, lumbosacral region  Muscle weakness (generalized)     Problem List Patient Active Problem List   Diagnosis Date Noted  . Allergic rhinitis 11/19/2013  . Esophageal reflux 11/19/2013  . Other and unspecified hyperlipidemia 11/19/2013    Joneen Boers PT, DPT   10/05/2019, 6:41 PM  [facility] [address] [place], [address] Phone: [phone]   Fax:  [phone]  Name:  Patricia Oliver MRN: [id number] Date of Birth: [birth year]  [facility] [address] [place], [address] Phone: [phone]   Fax:  [phone]  Physical Therapy Treatment  Patient Details  Name: Patricia Oliver MRN: [id number] Date of Birth: 01/04/1951 Referring Provider (PT): Martyn Malay [place], [place]   Encounter Date: 10/05/2019  PT End of Session - 10/05/19 1654    Visit Number  5    Number of Visits  13    Date for PT Re-Evaluation  11/05/19    Authorization Type  5    Authorization Time Period  of 10 Progress report    PT Start Time  [id number]    PT Stop Time  1733    PT Time Calculation (min)  39 min    Activity Tolerance  Patient tolerated treatment well    Behavior During Therapy  [facility] for tasks assessed/performed       Past Medical History:  Diagnosis Date  . Dysrhythmia   . GERD (gastroesophageal reflux disease)   . Hyperlipidemia     Past Surgical History:  Procedure Laterality Date  . ABDOMINAL HYSTERECTOMY    . APPENDECTOMY    . BREAST BIOPSY Left 1996   neg  . COLONOSCOPY N/A 12/10/2014   Procedure: COLONOSCOPY;  Surgeon: Lollie Sails, MD;  Location: [facility] ENDOSCOPY;  Service: Endoscopy;  Laterality: N/A;  . ORIF ANKLE FRACTURE      There were no vitals filed for this visit.  Subjective Assessment - 10/05/19 1655    Subjective  Low back is getting better now. 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 Used a pillow today. L LE is a little stronger. Does not feel like it is going to buckle. No back pain currently. 2-3/10 L posterior hip/low back pain at most for the past 7 days.    Pertinent History  Back and LE pain. Pt helped her niece move and pt felt symptoms 2 years ago. Pt was driving home from [place] and pt felt her pain. The cortisone shot helped. 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 Had an X-ray for her low  back which revealed degenerating disc. Was given prednisone and muscles relaxers. Does not know if prednisone helped. Feels L LE weakness such as when going down steps. Feels like L LE might buckle. Pain is better with movement. Pt works 12-15 hours a week. Pt tries not to sit for more than 2 hours without getting up. Denies loss of bowel or bladder control or saddle anesthesia. Pt is a side (R side) and stomach sleeper.    Patient Stated Goals  Be able to control the pain a little better    Currently in Pain?  No/denies    Pain Score  0-No pain    Pain Onset  More than a month ago                               PT Education - 10/05/19 1715    Education Details  ther-ex    Person(s) Educated  Patient    Methods  Explanation;Demonstration;Tactile cues;Verbal cues    Comprehension  Returned demonstration;Verbalized understanding       Objective    No latex band allergies    Sitting posture L lateral lean  MedbridgeAccess Code: [email or web address]  Therapeutic exercise  [facility] [address] [place], [address] Phone: [phone]   Fax:  [phone]  Physical Therapy Treatment  Patient Details  Name: Patricia Oliver MRN: [id number] Date of Birth: 01/04/1951 Referring Provider (PT): Martyn Malay [place], [place]   Encounter Date: 10/05/2019  PT End of Session - 10/05/19 1654    Visit Number  5    Number of Visits  13    Date for PT Re-Evaluation  11/05/19    Authorization Type  5    Authorization Time Period  of 10 Progress report    PT Start Time  [id number]    PT Stop Time  1733    PT Time Calculation (min)  39 min    Activity Tolerance  Patient tolerated treatment well    Behavior During Therapy  [facility] for tasks assessed/performed       Past Medical History:  Diagnosis Date  . Dysrhythmia   . GERD (gastroesophageal reflux disease)   . Hyperlipidemia     Past Surgical History:  Procedure Laterality Date  . ABDOMINAL HYSTERECTOMY    . APPENDECTOMY    . BREAST BIOPSY Left 1996   neg  . COLONOSCOPY N/A 12/10/2014   Procedure: COLONOSCOPY;  Surgeon: Lollie Sails, MD;  Location: [facility] ENDOSCOPY;  Service: Endoscopy;  Laterality: N/A;  . ORIF ANKLE FRACTURE      There were no vitals filed for this visit.  Subjective Assessment - 10/05/19 1655    Subjective  Low back is getting better now. 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 Used a pillow today. L LE is a little stronger. Does not feel like it is going to buckle. No back pain currently. 2-3/10 L posterior hip/low back pain at most for the past 7 days.    Pertinent History  Back and LE pain. Pt helped her niece move and pt felt symptoms 2 years ago. Pt was driving home from [place] and pt felt her pain. The cortisone shot helped. 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 Had an X-ray for her low  back which revealed degenerating disc. Was given prednisone and muscles relaxers. Does not know if prednisone helped. Feels L LE weakness such as when going down steps. Feels like L LE might buckle. Pain is better with movement. Pt works 12-15 hours a week. Pt tries not to sit for more than 2 hours without getting up. Denies loss of bowel or bladder control or saddle anesthesia. Pt is a side (R side) and stomach sleeper.    Patient Stated Goals  Be able to control the pain a little better    Currently in Pain?  No/denies    Pain Score  0-No pain    Pain Onset  More than a month ago                               PT Education - 10/05/19 1715    Education Details  ther-ex    Person(s) Educated  Patient    Methods  Explanation;Demonstration;Tactile cues;Verbal cues    Comprehension  Returned demonstration;Verbalized understanding       Objective    No latex band allergies    Sitting posture L lateral lean  MedbridgeAccess Code: [email or web address]  Therapeutic exercise

## 2019-10-06 ENCOUNTER — Other Ambulatory Visit: Payer: Self-pay | Admitting: Family Medicine

## 2019-10-07 ENCOUNTER — Ambulatory Visit: Payer: Medicare PPO

## 2019-10-07 ENCOUNTER — Other Ambulatory Visit: Payer: Self-pay

## 2019-10-07 DIAGNOSIS — M25552 Pain in left hip: Secondary | ICD-10-CM

## 2019-10-07 DIAGNOSIS — G8929 Other chronic pain: Secondary | ICD-10-CM

## 2019-10-07 DIAGNOSIS — M5417 Radiculopathy, lumbosacral region: Secondary | ICD-10-CM

## 2019-10-07 DIAGNOSIS — M6281 Muscle weakness (generalized): Secondary | ICD-10-CM

## 2019-10-07 NOTE — Patient Instructions (Signed)
Self lateral shift isometrics to the R at the wall in sitting using pillows 10x5 seconds   Reviewed and given as part of her HEP. Pt demonstrated and verbalized understanding. Picture on pt phone provided. 10x3 with 5 second holds

## 2019-10-07 NOTE — Therapy (Signed)
[facility]ICINE [address] [place], [address] Phone: [phone]   Fax:  [phone]  Physical Therapy Treatment  Patient Details  Name: Patricia Oliver MRN: [id number] Date of Birth: [birth year] Referring Provider (PT): Marlowe Sax [place], [place]   Encounter Date: 10/07/2019  PT End of Session - 10/07/19 1648    Visit Number  6    Number of Visits  13    Date for PT Re-Evaluation  11/05/19    Authorization Type  6    Authorization Time Period  of 10 Progress report    PT Start Time  1649    PT Stop Time  1730    PT Time Calculation (min)  41 min    Activity Tolerance  Patient tolerated treatment well    Behavior During Therapy  [facility] for tasks assessed/performed       Past Medical History:  Diagnosis Date  . Dysrhythmia   . GERD (gastroesophageal reflux disease)   . Hyperlipidemia     Past Surgical History:  Procedure Laterality Date  . ABDOMINAL HYSTERECTOMY    . APPENDECTOMY    . BREAST BIOPSY Left 1996   neg  . COLONOSCOPY N/A 12/10/2014   Procedure: COLONOSCOPY;  Surgeon: Christena Deem, MD;  Location: [facility] ENDOSCOPY;  Service: Endoscopy;  Laterality: N/A;  . ORIF ANKLE FRACTURE      There were no vitals filed for this visit.  Subjective Assessment - 10/07/19 1650    Subjective  Back is pretty good. Worked at the [facility] [facility] [employer] yesterday and was standing from 8 am to 5 pm. When pt is not sitting she does pretty good.  Felt L sciatic symptoms when walking in but fine now.  2-3/10 low back and 3/10 L sciatic symptoms at most for the past 7 days    Pertinent History  Back and LE pain. Pt helped her niece move and pt felt symptoms 2 years ago. Pt was driving home from [place] and pt felt her pain. The cortisone shot helped. 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 Had an X-ray for her low back which revealed  degenerating disc. Was given prednisone and muscles relaxers. Does not know if prednisone helped. Feels L LE weakness such as when going down steps. Feels like L LE might buckle. Pain is better with movement. Pt works 12-15 hours a week. Pt tries not to sit for more than 2 hours without getting up. Denies loss of bowel or bladder control or saddle anesthesia. Pt is a side (R side) and stomach sleeper.    Patient Stated Goals  Be able to control the pain a little better    Currently in Pain?  No/denies    Pain Score  0-No pain    Pain Onset  More than a month ago                               PT Education - 10/07/19 1712    Education Details  ther-ex, HEP    Person(s) Educated  Patient    Methods  Explanation;Demonstration;Tactile cues;Verbal cues;Handout    Comprehension  Returned demonstration;Verbalized understanding         Objective    No latex band allergies    Sitting posture L lateral lean  MedbridgeAccess Code: [email or web address]  Therapeutic exercise  seated manually resisted R lateral shift isometrics in neutral to counter L lateral shift             10x3 with 5 second holds     Then self isometrics to the R at the wall in sitting using pillows 10x5 seconds   Reviewed and given as part of her HEP. Pt demonstrated and verbalized understanding. Picture on pt phone provided. 10x3 with 5 second holds   Standing B low rows green 5x5 seconds. Difficult  red band 10x5 seconds for 3 sets    Standing PNF chops to the R to decrease R lumbar paraspinal tension double red band 10x3 with 5 second holds   Supine hip IR PROM with PT L 10x3  (50 degrees L hip IR prior to exercise)   Prone glute max extension  R 10x3 L 10x3   re-asses next weeks for progress   Improved exercise technique, movement at target joints, use of target muscles after min to mod verbal, visual, tactile cues.    Response to  treatment Pt tolerated session well without aggravation of symptoms   Clinical impression Pt demonstrates significant improvement in decreased low back and L LE pain with worst pain level being 3/10 at worst for the past 7 days. Pt also demonstrates improving L hip IR PROM, with PROM being equal to the R. Pt making very good progress with PT towards goals. Pt also demonstrates decreased L lateral shift posture observed. Continued working on promoting more neutral posture, improving trunk and glute strength to continue progress. Pt will benefit from continued skilled physical therapy services to decrease pain. Improve strength and function.       PT Short Term Goals - 09/21/19 1657      PT SHORT TERM GOAL #1   Title  Patient will be independent with her HEP to promote ability to perform work duties as well as to be able to drive longer distances more comfortably.    Baseline  Pt has started her HEP (09/21/2019)    Time  3    Period  Weeks    Status  New    Target Date  10/15/19        PT Long Term Goals - 09/21/19 1658      PT LONG TERM GOAL #1   Title  Patient will have a decrease in L low back and posterior hip pain to 3/10 or less at most to promote ability to perform her work duties as well as be able to drive longer distances more comfortably.    Baseline  9/10 low back/posterior L hip pain at most for the past 3 months (09/21/2019)    Time  6    Period  Weeks    Status  New    Target Date  11/05/19      PT LONG TERM GOAL #2   Title  Patient will improve bilateral hip extension and abduction strength by at least 1/2 MMT grade to promote ability to perform functional tasks more comfortably.    Time  6    Period  Weeks    Status  New    Target Date  11/05/19      PT LONG TERM GOAL #3   Title  Pt will improve L hip IR PROM to eaqual her R to promote ability to sit more comfortably.    Baseline  L 38 degrees, R 52 degrees (09/21/2019)    Time  6    Period  Weeks  Status   New    Target Date  11/05/19            Plan - 10/07/19 1713    Clinical Impression Statement  Pt demonstrates significant improvement in decreased low back and L LE pain with worst pain level being 3/10 at worst for the past 7 days. Pt also demonstrates improving L hip IR PROM, with PROM being equal to the R. Pt making very good progress with PT towards goals. Pt also demonstrates decreased L lateral shift posture observed. Continued working on promoting more neutral posture, improving trunk and glute strength to continue progress. Pt will benefit from continued skilled physical therapy services to decrease pain. Improve strength and function.    Personal Factors and Comorbidities  Age;Past/Current Experience;Time since onset of injury/illness/exacerbation;Profession   profession: desk job   Examination-Activity Limitations  Sit    Stability/Clinical Decision Making  Stable/Uncomplicated    Rehab Potential  Fair    PT Frequency  2x / week    PT Duration  6 weeks    PT Treatment/Interventions  Therapeutic activities;Therapeutic exercise;Functional mobility training;Neuromuscular re-education;Patient/family education;Manual techniques;Dry needling;Joint Manipulations;Spinal Manipulations;Aquatic Therapy;Electrical Stimulation;Iontophoresis 4mg /ml Dexamethasone;Traction;Ultrasound    PT Next Visit Plan  hip ROM, glute, trunk strengthening, lumbopelvic/femoral control, posture, manual techniques, modalities PRN    PT Home Exercise Plan  Medbridge Access Code: [email or web address]    Consulted and Agree with Plan of Care  Patient       Patient will benefit from skilled therapeutic intervention in order to improve the following deficits and impairments:  Pain, Postural dysfunction, Improper body mechanics, Decreased strength  Visit Diagnosis: Chronic left-sided low back pain, unspecified whether sciatica present  Pain in left hip  Radiculopathy, lumbosacral region  Muscle weakness  (generalized)     Problem List Patient Active Problem List   Diagnosis Date Noted  . Allergic rhinitis 11/19/2013  . Esophageal reflux 11/19/2013  . Other and unspecified hyperlipidemia 11/19/2013    Loralyn Freshwater PT, DPT   10/07/2019, 5:48 PM  [facility] PHYSICAL AND SPORTS MEDICINE [address] [place], [address] Phone: [phone]   Fax:  [phone]  Name: CALDONIA DARNER MRN: [id number] Date of Birth: 04/06/1951

## 2019-10-12 ENCOUNTER — Other Ambulatory Visit: Payer: Self-pay

## 2019-10-12 ENCOUNTER — Ambulatory Visit: Payer: Medicare PPO

## 2019-10-12 DIAGNOSIS — M6281 Muscle weakness (generalized): Secondary | ICD-10-CM

## 2019-10-12 DIAGNOSIS — G8929 Other chronic pain: Secondary | ICD-10-CM

## 2019-10-12 DIAGNOSIS — M25552 Pain in left hip: Secondary | ICD-10-CM | POA: Diagnosis not present

## 2019-10-12 DIAGNOSIS — M5417 Radiculopathy, lumbosacral region: Secondary | ICD-10-CM

## 2019-10-12 DIAGNOSIS — M545 Low back pain, unspecified: Secondary | ICD-10-CM

## 2019-10-12 NOTE — Therapy (Addendum)
[facility] [facility]IC[facility] [address], [place], [address] Phone: [phone]   Fax:  [phone]  Physical Therapy Treatment And Discharge Summary  Patient Details  Name: Patricia Oliver MRN: [id number] Date of Birth: 03-01-1951 Referring Provider (PT): Martyn Malay [place], [place]   Encounter Date: 10/12/2019  PT End of Session - 10/12/19 1652    Visit Number  7    Number of Visits  13    Date for PT Re-Evaluation  11/05/19    Authorization Type  7    Authorization Time Period  of 10 Progress report    PT Start Time  1652    PT Stop Time  1731    PT Time Calculation (min)  39 min    Activity Tolerance  Patient tolerated treatment well    Behavior During Therapy  [facility] for tasks assessed/performed       Past Medical History:  Diagnosis Date  . Dysrhythmia   . GERD (gastroesophageal reflux disease)   . Hyperlipidemia     Past Surgical History:  Procedure Laterality Date  . ABDOMINAL HYSTERECTOMY    . APPENDECTOMY    . BREAST BIOPSY Left 1996   neg  . COLONOSCOPY N/A 12/10/2014   Procedure: COLONOSCOPY;  Surgeon: Lollie Sails, MD;  Location: [facility] ENDOSCOPY;  Service: Endoscopy;  Laterality: N/A;  . ORIF ANKLE FRACTURE      There were no vitals filed for this visit.  Subjective Assessment - 10/12/19 1653    Subjective  Drove 3 hours to [facility] and back was fine. Had a pillow at her low back. No pain currently. 2/10 at worst for the past 7 days.   Feels like she can graduate to her HEP after today's session. The HEP continue to help her and improve.    Pertinent History  Back and LE pain. Pt helped her niece move and pt felt symptoms 2 years ago. Pt was driving home from [place] and pt felt her pain. The cortisone shot helped. 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 Had an X-ray for her low back which revealed degenerating  disc. Was given prednisone and muscles relaxers. Does not know if prednisone helped. Feels L LE weakness such as when going down steps. Feels like L LE might buckle. Pain is better with movement. Pt works 12-15 hours a week. Pt tries not to sit for more than 2 hours without getting up. Denies loss of bowel or bladder control or saddle anesthesia. Pt is a side (R side) and stomach sleeper.    Patient Stated Goals  Be able to control the pain a little better    Currently in Pain?  No/denies    Pain Score  0-No pain    Pain Onset  More than a month ago         [facility] PT Assessment - 10/12/19 1659      Observation/Other Assessments   Focus on Therapeutic Outcomes (FOTO)   Lumbar FOTO 94      Strength   Right Hip Extension  4/5    Right Hip ABduction  4/5    Left Hip Extension  4+/5    Left Hip ABduction  4+/5                           PT Education - 10/12/19 1710  improve L hip IR PROM to eaqual her R to promote ability to sit more comfortably.    Baseline  L 38 degrees, R 52 degrees (09/21/2019); L 50 degrees after treatment, R 50 degrees (10/12/19)    Time  6    Period  Weeks    Status  Achieved    Target Date  11/05/19            Plan - 10/12/19 1713    Clinical Impression Statement  Pt demonstrates significant decrease in low back and posterior L hip pain, improved L hip IR ROM, and overall improved bilateral hip strength since initial evaluation. Pt also demonstrates improved FOTO score from 82 to 94 suggesting significant improvement in function. Pt has made very good progress with PT towards goals. Skilled physical therapy services discharged with pt continuing progress with her exercises at home.    Personal Factors and Comorbidities  Age;Past/Current Experience;Time since onset of injury/illness/exacerbation;Profession   profession: desk job   Examination-Activity Limitations  Sit    Stability/Clinical Decision Making  Stable/Uncomplicated    Clinical Decision Making  Low    Rehab Potential  --    PT Frequency  --    PT Duration  --    PT Treatment/Interventions  Therapeutic exercise;Neuromuscular re-education;Patient/family education;Manual techniques;Therapeutic activities    PT Next Visit Plan  Continue progress with exercises at home.    PT Home Exercise Plan  Medbridge Access Code: [email or web address]    Consulted and Agree with Plan of Care  Patient       Patient will benefit from skilled therapeutic intervention in order to improve the following deficits and impairments:  Pain, Postural dysfunction, Improper body mechanics, Decreased strength  Visit Diagnosis: Chronic left-sided low back pain, unspecified whether sciatica present  Pain in left hip  Radiculopathy, lumbosacral region  Muscle weakness (generalized)     Problem List Patient Active Problem List   Diagnosis Date Noted  . Allergic  rhinitis 11/19/2013  . Esophageal reflux 11/19/2013  . Other and unspecified hyperlipidemia 11/19/2013   Thank you for your referral.  Joneen Boers PT, DPT  10/12/2019, 7:30 PM  [place] PHYSICAL AND SPORTS MEDICINE [address], [place], [address] Phone: [phone]   Fax:  [phone]  Name: Patricia Oliver MRN: [id number] Date of Birth: 1950/10/17  improve L hip IR PROM to eaqual her R to promote ability to sit more comfortably.    Baseline  L 38 degrees, R 52 degrees (09/21/2019); L 50 degrees after treatment, R 50 degrees (10/12/19)    Time  6    Period  Weeks    Status  Achieved    Target Date  11/05/19            Plan - 10/12/19 1713    Clinical Impression Statement  Pt demonstrates significant decrease in low back and posterior L hip pain, improved L hip IR ROM, and overall improved bilateral hip strength since initial evaluation. Pt also demonstrates improved FOTO score from 82 to 94 suggesting significant improvement in function. Pt has made very good progress with PT towards goals. Skilled physical therapy services discharged with pt continuing progress with her exercises at home.    Personal Factors and Comorbidities  Age;Past/Current Experience;Time since onset of injury/illness/exacerbation;Profession   profession: desk job   Examination-Activity Limitations  Sit    Stability/Clinical Decision Making  Stable/Uncomplicated    Clinical Decision Making  Low    Rehab Potential  --    PT Frequency  --    PT Duration  --    PT Treatment/Interventions  Therapeutic exercise;Neuromuscular re-education;Patient/family education;Manual techniques;Therapeutic activities    PT Next Visit Plan  Continue progress with exercises at home.    PT Home Exercise Plan  Medbridge Access Code: [email or web address]    Consulted and Agree with Plan of Care  Patient       Patient will benefit from skilled therapeutic intervention in order to improve the following deficits and impairments:  Pain, Postural dysfunction, Improper body mechanics, Decreased strength  Visit Diagnosis: Chronic left-sided low back pain, unspecified whether sciatica present  Pain in left hip  Radiculopathy, lumbosacral region  Muscle weakness (generalized)     Problem List Patient Active Problem List   Diagnosis Date Noted  . Allergic  rhinitis 11/19/2013  . Esophageal reflux 11/19/2013  . Other and unspecified hyperlipidemia 11/19/2013   Thank you for your referral.  Joneen Boers PT, DPT  10/12/2019, 7:30 PM  [place] PHYSICAL AND SPORTS MEDICINE [address], [place], [address] Phone: [phone]   Fax:  [phone]  Name: Patricia Oliver MRN: [id number] Date of Birth: 1950/10/17

## 2019-10-12 NOTE — Patient Instructions (Addendum)
  Access Code: [email or web address] URL: [email or web address] Date: 10/12/2019 Prepared by: Joneen Boers  Exercises Seated Hip Internal Rotation AROM - 3 x daily - 7 x weekly - 10 reps - 3 sets Static Prone on Elbows - 3 x daily - 7 x weekly - 2 reps - 1 sets - 3 minutes hold Prone Quadriceps Set - 1 x daily - 7 x weekly - 10 reps - 3 sets - 10 seconds hold Prone Hip Extension with Bent Knee - One Pillow - 1 x daily - 7 x weekly - 3 sets - 10 reps Seated Transversus Abdominis Bracing - 1 x daily - 7 x weekly - 3 sets - 10 reps - 5 seconds hold

## 2019-10-14 ENCOUNTER — Ambulatory Visit: Payer: Medicare PPO

## 2019-10-19 ENCOUNTER — Ambulatory Visit: Payer: Medicare PPO

## 2020-01-14 ENCOUNTER — Other Ambulatory Visit: Payer: Self-pay | Admitting: Family Medicine

## 2020-05-02 ENCOUNTER — Ambulatory Visit: Payer: Medicare PPO | Attending: Internal Medicine

## 2020-05-02 DIAGNOSIS — Z23 Encounter for immunization: Secondary | ICD-10-CM

## 2020-05-02 NOTE — Progress Notes (Signed)
Covid-19 Vaccination Clinic  Name:  Patricia Oliver    MRN: [id number] DOB: [birth year]  05/02/2020  Patricia Oliver was observed post Covid-19 immunization for 15 minutes without incident. She was provided with Vaccine Information Sheet and instruction to access the V-Safe system.   Patricia Oliver was instructed to call 911 with any severe reactions post vaccine: Marland Kitchen Difficulty breathing  . Swelling of face and throat  . A fast heartbeat  . A bad rash all over body  . Dizziness and weakness

## 2020-05-09 ENCOUNTER — Other Ambulatory Visit (HOSPITAL_COMMUNITY): Payer: Self-pay | Admitting: Orthopedic Surgery

## 2020-05-09 ENCOUNTER — Other Ambulatory Visit: Payer: Self-pay | Admitting: Orthopedic Surgery

## 2020-05-09 DIAGNOSIS — G8929 Other chronic pain: Secondary | ICD-10-CM

## 2020-05-09 DIAGNOSIS — M5442 Lumbago with sciatica, left side: Secondary | ICD-10-CM

## 2020-05-19 ENCOUNTER — Other Ambulatory Visit: Payer: Self-pay | Admitting: Family Medicine

## 2020-05-26 ENCOUNTER — Ambulatory Visit
Admission: RE | Admit: 2020-05-26 | Discharge: 2020-05-26 | Disposition: A | Discharge status: Home / Self Care | Payer: Medicare PPO | Source: Ambulatory Visit | Attending: Orthopedic Surgery | Admitting: Orthopedic Surgery

## 2020-05-26 ENCOUNTER — Other Ambulatory Visit: Payer: Self-pay

## 2020-05-26 DIAGNOSIS — M5442 Lumbago with sciatica, left side: Secondary | ICD-10-CM | POA: Insufficient documentation

## 2020-05-26 DIAGNOSIS — G8929 Other chronic pain: Secondary | ICD-10-CM | POA: Diagnosis present

## 2020-05-31 ENCOUNTER — Other Ambulatory Visit: Payer: Self-pay | Admitting: Family Medicine

## 2020-05-31 DIAGNOSIS — Z1231 Encounter for screening mammogram for malignant neoplasm of breast: Secondary | ICD-10-CM

## 2020-06-15 ENCOUNTER — Other Ambulatory Visit: Payer: Self-pay | Admitting: Family Medicine

## 2020-06-28 ENCOUNTER — Other Ambulatory Visit: Payer: Self-pay

## 2020-06-28 ENCOUNTER — Ambulatory Visit
Admission: RE | Admit: 2020-06-28 | Discharge: 2020-06-28 | Disposition: A | Discharge status: Home / Self Care | Payer: Medicare PPO | Source: Ambulatory Visit | Attending: Family Medicine | Admitting: Family Medicine

## 2020-06-28 DIAGNOSIS — Z1231 Encounter for screening mammogram for malignant neoplasm of breast: Secondary | ICD-10-CM | POA: Insufficient documentation

## 2020-07-01 ENCOUNTER — Other Ambulatory Visit: Payer: Self-pay | Admitting: Family Medicine

## 2020-08-09 ENCOUNTER — Other Ambulatory Visit: Payer: Self-pay | Admitting: Family Medicine

## 2020-10-26 ENCOUNTER — Other Ambulatory Visit: Payer: Self-pay

## 2020-10-26 ENCOUNTER — Ambulatory Visit: Payer: Medicare PPO | Admitting: Gastroenterology

## 2020-10-26 ENCOUNTER — Encounter: Payer: Self-pay | Admitting: Gastroenterology

## 2020-10-26 VITALS — BP 112/73 | HR 80 | Ht 64.0 in | Wt 142.4 lb

## 2020-10-26 DIAGNOSIS — R194 Change in bowel habit: Secondary | ICD-10-CM

## 2020-10-26 MED ORDER — DICYCLOMINE HCL 10 MG PO CAPS
10.0000 mg | ORAL_CAPSULE | Freq: Three times a day (TID) | ORAL | 0 refills | Status: DC
  Filled 2020-10-26: qty 360, 90d supply, fill #0

## 2020-10-26 MED ORDER — NA SULFATE-K SULFATE-MG SULF 17.5-3.13-1.6 GM/177ML PO SOLN
1.0000 | Freq: Once | ORAL | 0 refills | Status: AC
  Filled 2020-10-26: qty 354, fill #0

## 2020-10-26 NOTE — Patient Instructions (Signed)
Low-FODMAP Eating Plan  FODMAP stands for fermentable oligosaccharides, disaccharides, monosaccharides, and polyols. These are sugars that are hard for some people to digest. A low-FODMAP eating plan may help some people who have irritable bowel syndrome (IBS) and certain other bowel (intestinal) diseases to manage their symptoms. This meal plan can be complicated to follow. Work with a diet and nutrition specialist (dietitian) to make a low-FODMAP eating plan that is right for you. A dietitian can help make sure that you get enough nutrition from this diet. What are tips for following this plan? Reading food labels  Check labels for hidden FODMAPs such as: ? High-fructose syrup. ? Honey. ? Agave. ? Natural fruit flavors. ? Onion or garlic powder.  Choose low-FODMAP foods that contain 3-4 grams of fiber per serving.  Check food labels for serving sizes. Eat only one serving at a time to make sure FODMAP levels stay low. Shopping  Shop with a list of foods that are recommended on this diet and make a meal plan. Meal planning  Follow a low-FODMAP eating plan for up to 6 weeks, or as told by your health care provider or dietitian.  To follow the eating plan: 1. Eliminate high-FODMAP foods from your diet completely. Choose only low-FODMAP foods to eat. You will do this for 2-6 weeks. 2. Gradually reintroduce high-FODMAP foods into your diet one at a time. Most people should wait a few days before introducing the next new high-FODMAP food into their meal plan. Your dietitian can recommend how quickly you may reintroduce foods. 3. Keep a daily record of what and how much you eat and drink. Make note of any symptoms that you have after eating. 4. Review your daily record with a dietitian regularly to identify which foods you can eat and which foods you should avoid. General tips  Drink enough fluid each day to keep your urine pale yellow.  Avoid processed foods. These often have added sugar  and may be high in FODMAPs.  Avoid most dairy products, whole grains, and sweeteners.  Work with a dietitian to make sure you get enough fiber in your diet.  Avoid high FODMAP foods at meals to manage symptoms. Recommended foods Fruits Bananas, oranges, tangerines, lemons, limes, blueberries, raspberries, strawberries, grapes, cantaloupe, honeydew melon, kiwi, papaya, passion fruit, and pineapple. Limited amounts of dried cranberries, banana chips, and shredded coconut. Vegetables Eggplant, zucchini, cucumber, peppers, green beans, bean sprouts, lettuce, arugula, kale, Swiss chard, spinach, collard greens, bok choy, summer squash, potato, and tomato. Limited amounts of corn, carrot, and sweet potato. Green parts of scallions. Grains Gluten-free grains, such as rice, oats, buckwheat, quinoa, corn, polenta, and millet. Gluten-free pasta, bread, or cereal. Rice noodles. Corn tortillas. Meats and other proteins Unseasoned beef, pork, poultry, or fish. Eggs. Berniece Salines. Tofu (firm) and tempeh. Limited amounts of nuts and seeds, such as almonds, walnuts, [place] nuts, pecans, peanuts, nut butters, pumpkin seeds, chia seeds, and sunflower seeds. Dairy Lactose-free milk, yogurt, and kefir. Lactose-free cottage cheese and ice cream. Non-dairy milks, such as almond, coconut, hemp, and rice milk. Non-dairy yogurt. Limited amounts of goat cheese, brie, mozzarella, parmesan, swiss, and other hard cheeses. Fats and oils Butter-free spreads. Vegetable oils, such as olive, canola, and sunflower oil. Seasoning and other foods Artificial sweeteners with names that do not end in "ol," such as aspartame, saccharine, and stevia. Maple syrup, white table sugar, raw sugar, brown sugar, and molasses. Mayonnaise, soy sauce, and tamari. Fresh basil, coriander, parsley, rosemary, and thyme. Beverages Water and  mineral water. Sugar-sweetened soft drinks. Small amounts of orange juice or cranberry juice. Black and green tea.  Most dry wines. Coffee. The items listed above may not be a complete list of foods and beverages you can eat. Contact a dietitian for more information. Foods to avoid Fruits Fresh, dried, and juiced forms of apple, pear, watermelon, peach, plum, cherries, apricots, blackberries, boysenberries, figs, nectarines, and mango. Avocado. Vegetables Chicory root, artichoke, asparagus, cabbage, snow peas, Brussels sprouts, broccoli, sugar snap peas, mushrooms, celery, and cauliflower. Onions, garlic, leeks, and the white part of scallions. Grains Wheat, including kamut, durum, and semolina. Barley and bulgur. Couscous. Wheat-based cereals. Wheat noodles, bread, crackers, and pastries. Meats and other proteins Fried or fatty meat. Sausage. Cashews and pistachios. Soybeans, baked beans, black beans, chickpeas, kidney beans, fava beans, navy beans, lentils, black-eyed peas, and split peas. Dairy Milk, yogurt, ice cream, and soft cheese. Cream and sour cream. Milk-based sauces. Custard. Buttermilk. Soy milk. Seasoning and other foods Any sugar-free gum or candy. Foods that contain artificial sweeteners such as sorbitol, mannitol, isomalt, or xylitol. Foods that contain honey, high-fructose corn syrup, or agave. Bouillon, vegetable stock, beef stock, and chicken stock. Garlic and onion powder. Condiments made with onion, such as hummus, chutney, pickles, relish, salad dressing, and salsa. Tomato paste. Beverages Chicory-based drinks. Coffee substitutes. Chamomile tea. Fennel tea. Sweet or fortified wines such as port or sherry. Diet soft drinks made with isomalt, mannitol, maltitol, sorbitol, or xylitol. Apple, pear, and mango juice. Juices with high-fructose corn syrup. The items listed above may not be a complete list of foods and beverages you should avoid. Contact a dietitian for more information. Summary  FODMAP stands for fermentable oligosaccharides, disaccharides, monosaccharides, and polyols. These  are sugars that are hard for some people to digest.  A low-FODMAP eating plan is a short-term diet that helps to ease symptoms of certain bowel diseases.  The eating plan usually lasts up to 6 weeks. After that, high-FODMAP foods are reintroduced gradually and one at a time. This can help you find out which foods may be causing symptoms.  A low-FODMAP eating plan can be complicated. It is best to work with a dietitian who has experience with this type of plan. This information is not intended to replace advice given to you by your health care provider. Make sure you discuss any questions you have with your health care provider. Document Revised: 11/26/2019 Document Reviewed: 11/26/2019 Elsevier Patient Education  [place].

## 2020-10-26 NOTE — Progress Notes (Signed)
[name] MD, MRCP(U.K) [address]  [place], [place] [address]  Main: [phone]  Fax: [phone]   Gastroenterology Consultation  Referring Provider:     Derinda Late, MD Primary Care Physician:  Derinda Late, MD Primary Gastroenterologist:  Dr. [name]  Reason for Consultation:     IBS diarrhea        HPI:   Patricia Oliver is a 70 y.o. y/o female referred for consultation & management  by Dr. Derinda Late, MD.      Diarrhea : States she has had crampy abdominal pain in the lower part of the abdomen nonradiating preceding a bowel movement and not completely relieved after began a few months back not under any stress.  No similar complaints in the past.  No particular foods that make it worse.  Onset: Few months back Number of bowel movements a day : 3-4 mostly in the morning after she wakes up Color : normal  Consistency: Soft Present status: Ongoing Shape of stool: Long and stringy Weight loss: No Prior colonoscopy: 2016 by Dr. Gustavo Lah that was hyperplastic. Artificial sugars/sodas/chewing gum: Yes consumes sweet tea half a cup of artificial sugar Stevia in 1 gallon Bloating: Yes Gas: Yes Antibiotic use: no  Takes Celebrex 1 tablet daily for many years for orthopedic injury  No recent imaging 06/20/2020 hemoglobin 13.2 g, CMP normal, HbA1c 5.4 Past Medical History:  Diagnosis Date  . Dysrhythmia   . GERD (gastroesophageal reflux disease)   . Hyperlipidemia     Past Surgical History:  Procedure Laterality Date  . ABDOMINAL HYSTERECTOMY    . APPENDECTOMY    . BREAST BIOPSY Left 1996   neg  . COLONOSCOPY N/A 12/10/2014   Procedure: COLONOSCOPY;  Surgeon: Lollie Sails, MD;  [facility] ENDOSCOPY;  Service: Endoscopy;  Laterality: N/A;  . ORIF ANKLE FRACTURE      Prior to Admission medications   Medication Sig Start Date End Date Taking? Authorizing Provider  amoxicillin (AMOXIL) 875 MG tablet Take 1 tablet (875 mg  total) by mouth 2 (two) times daily. Patient not taking: Reported on 01/17/2017 11/07/16   Versie Starks, PA-C  Ascorbic Acid (VITAMIN C) 100 MG tablet Take 400 mg by mouth daily.    [provider]  azelastine (ASTELIN) 0.1 % nasal spray Place 1 spray into both nostrils 2 (two) times daily. Use in each nostril as directed    [provider]  azelastine (ASTELIN) 0.1 % nasal spray PLACE 1 SPRAY INTO BOTH NOSTRILS ONCE DAILY 10/06/19 10/05/20  Derinda Late, MD  azelastine (OPTIVAR) 0.05 % ophthalmic solution PLACE 1 DROP INTO BOTH EYES ONCE DAILY. 01/14/20 01/13/21  Derinda Late, MD  azithromycin (ZITHROMAX Z-PAK) 250 MG tablet 2 pills today then 1 pill a day for 4 days for sinusitis 02/06/17   Versie Starks, PA-C  benzonatate (TESSALON) 200 MG capsule Take 1 capsule (200 mg total) by mouth 2 (two) times daily as needed for cough. Patient not taking: Reported on 01/17/2017 11/07/16   Versie Starks, PA-C  calcium citrate-vitamin D (CITRACAL+D) 315-200 MG-UNIT per tablet Take 2 tablets by mouth 2 (two) times daily.    [provider]  celecoxib (CELEBREX) 200 MG capsule Take by mouth. 10/14/15   [provider]  celecoxib (CELEBREX) 200 MG capsule TAKE 1 CAPSULE BY MOUTH TWICE DAILY 08/09/20 08/09/21  Derinda Late, MD  fluticasone (FLONASE) 50 MCG/ACT nasal spray Place 2 sprays into both nostrils daily.  [provider]  fluticasone (FLONASE) 50 MCG/ACT nasal spray PLACE 2 SPRAYS INTO BOTH NOSTRILS ONCE DAILY. 05/19/20 05/19/21  Derinda Late, MD  glucosamine-chondroitin 500-400 MG tablet Take 1 tablet by mouth 3 (three) times daily.    [provider]  guaifenesin (HUMIBID E) 400 MG TABS tablet Take 400 mg by mouth daily. For allergies for maple and pine trees    [provider]  levocetirizine (XYZAL) 5 MG tablet Take 5 mg by mouth every evening.    [provider]  montelukast (SINGULAIR) 10 MG tablet TAKE 1 TABLET BY  MOUTH ONCE DAILY 06/15/20 06/15/21  Derinda Late, MD  ondansetron (ZOFRAN) 4 MG tablet Take 1 tablet (4 mg total) by mouth every 8 (eight) hours as needed for nausea or vomiting. 02/06/17   Caryn Section Linden Dolin, PA-C  predniSONE (DELTASONE) 10 MG tablet Take 3 tablets (30 mg total) by mouth daily with breakfast. Patient not taking: Reported on 01/17/2017 11/01/16   Versie Starks, PA-C  scopolamine (TRANSDERM-SCOP, 1.5 MG,) 1 MG/3DAYS Place 1 patch (1.5 mg total) onto the skin every 3 (three) days. 01/17/17   Caryn Section Linden Dolin, PA-C  simvastatin (ZOCOR) 40 MG tablet Take by mouth. 10/14/15   [provider]  simvastatin (ZOCOR) 40 MG tablet TAKE 1 TABLET BY MOUTH ONCE DAILY 09/17/19 09/16/20  Derinda Late, MD  vitamin E 400 UNIT capsule Take 400 Units by mouth daily.    [provider]  YUVAFEM 10 MCG TABS vaginal tablet  11/26/16   [provider]  YUVAFEM 10 MCG TABS vaginal tablet PLACE 1 TABLET VAGINALLY TWICE A WEEK. 06/15/20 06/15/21  Derinda Late, MD  zolpidem (AMBIEN) 5 MG tablet TAKE 1 TABLET BY MOUTH NIGHTLY AS NEEDED FOR SLEEP 07/01/20 12/28/20  Derinda Late, MD    Family History  Problem Relation Age of Onset  . Breast cancer Sister 6  . Breast cancer Paternal Aunt   . Breast cancer Paternal Aunt   . Breast cancer Sister 66       dx twice with breast ca     Social History   Tobacco Use  . Smoking status: Never Smoker  . Smokeless tobacco: Never Used    Allergies as of 10/26/2020 - Review Complete 10/26/2020  Allergen Reaction Noted  . Morphine and related Nausea And Vomiting 12/09/2014  . Ciprofloxacin Swelling and Rash 12/01/2014  . Elemental sulfur Itching, Swelling, and Rash 12/01/2014    Review of Systems:    All systems reviewed and negative except where noted in HPI.   Physical Exam:  BP 112/73 (BP Location: Left Arm, Patient Position: Sitting, Cuff Size: Normal)   Pulse 80   Ht 5\' 4"  (1.626 m)   Wt 142 lb 6.4 oz (64.6 kg)   BMI  24.44 kg/m  No LMP recorded. Patient has had a hysterectomy. Psych:  Alert and cooperative. Normal mood and affect. General:   Alert,  Well-developed, well-nourished, pleasant and cooperative in NAD Head:  Normocephalic and atraumatic. Eyes:  Sclera clear, no icterus.   Conjunctiva pink. Ears:  Normal auditory acuity. Lungs:  Respirations even and unlabored.  Clear throughout to auscultation.   No wheezes, crackles, or rhonchi. No acute distress. Heart:  Regular rate and rhythm; no murmurs, clicks, rubs, or gallops. Abdomen:  Normal bowel sounds.  No bruits.  Soft, non-tender and non-distended without masses, hepatosplenomegaly or hernias noted.  No guarding or rebound tenderness.    Neurologic:  Alert and oriented x3;  grossly normal neurologically. Psych:  Alert and cooperative. Normal mood and affect.  Imaging Studies: No results found.  Assessment and Plan:   Patricia Oliver is a 70 y.o. y/o female has been referred for diarrhea.  Recent change in bowel habits.  Has multiple bowel movements in the morning preceded by cramping but not completely relieved after.  Long-term use of Celebrex.  She consumes fair amount of Stevia in her diet.  Differentials include NSAID related colitis versus bloating and cramping related to use of Stevia  Plan 1.  Stop use of artificial sugars and sweeteners, low FODMAP diet 2.  Stop Celebrex use increase acetaminophen dosage continue Prevacid long-term for GI protection 3.  Colonoscopy to evaluate change in bowel habits 4.  Bentyl as needed 10 mg 4 times daily for cramping 5.  If no better next visit consider checking for celiac serology and treatment of IBS diarrhea with Xifaxan   I have discussed alternative options, risks & benefits,  which include, but are not limited to, bleeding, infection, perforation,respiratory complication & drug reaction.  The patient agrees with this plan & written consent will be obtained.     Follow up in 8 weeks  Dr  [name] MD,MRCP(U.K)

## 2020-10-31 ENCOUNTER — Ambulatory Visit: Payer: Medicare PPO | Admitting: Gastroenterology

## 2020-11-03 ENCOUNTER — Telehealth: Payer: Self-pay | Admitting: Gastroenterology

## 2020-11-03 NOTE — Telephone Encounter (Signed)
Patient needs to reschedule procedure due to a family issue, please call to reschedule

## 2020-11-04 ENCOUNTER — Other Ambulatory Visit: Payer: Self-pay

## 2020-11-04 MED FILL — Fluticasone Propionate Nasal Susp 50 MCG/ACT: NASAL | 60 days supply | Qty: 32 | Fill #0 | Status: AC

## 2020-11-04 MED FILL — Celecoxib Cap 200 MG: ORAL | 90 days supply | Qty: 180 | Fill #0 | Status: AC

## 2020-11-07 ENCOUNTER — Telehealth: Payer: Self-pay

## 2020-11-07 ENCOUNTER — Other Ambulatory Visit: Payer: Self-pay

## 2020-11-07 NOTE — Telephone Encounter (Signed)
Returned patients call in regards to rescheduling procedure. LVM to call office back.

## 2020-11-08 ENCOUNTER — Telehealth: Payer: Self-pay

## 2020-11-08 NOTE — Telephone Encounter (Signed)
Patient called to confirm her new procedure date for 11/17/20? I told her the procedure appears to be canceled for next week. Patient says she canceled the procedure date for today (11/08/20) and was rescheduled for Thurs 11/17/20. Please call patient to advise

## 2020-11-09 ENCOUNTER — Other Ambulatory Visit: Payer: Self-pay

## 2020-11-09 DIAGNOSIS — R194 Change in bowel habit: Secondary | ICD-10-CM

## 2020-11-09 NOTE — Telephone Encounter (Signed)
Spoke with patient and was able to get procedure rescheduled. Pt verbalized understanding.

## 2020-11-09 NOTE — Progress Notes (Signed)
Procedure was rescheduled and updated instructions has been sent.

## 2020-11-16 ENCOUNTER — Other Ambulatory Visit: Payer: Self-pay

## 2020-11-17 ENCOUNTER — Ambulatory Visit: Admission: RE | Admit: 2020-11-17 | Payer: Medicare PPO | Source: Home / Self Care | Admitting: Gastroenterology

## 2020-11-17 ENCOUNTER — Encounter: Admission: RE | Payer: Self-pay | Source: Home / Self Care

## 2020-11-17 SURGERY — COLONOSCOPY WITH PROPOFOL
Anesthesia: General

## 2020-11-21 ENCOUNTER — Encounter: Payer: Self-pay | Admitting: Gastroenterology

## 2020-11-21 ENCOUNTER — Ambulatory Visit: Payer: Medicare PPO | Admitting: Anesthesiology

## 2020-11-21 ENCOUNTER — Other Ambulatory Visit: Payer: Self-pay

## 2020-11-21 ENCOUNTER — Encounter
Admission: RE | Disposition: A | Discharge status: Home / Self Care | Payer: Self-pay | Source: Ambulatory Visit | Attending: Gastroenterology

## 2020-11-21 ENCOUNTER — Ambulatory Visit
Admission: RE | Admit: 2020-11-21 | Discharge: 2020-11-21 | Disposition: A | Discharge status: Home / Self Care | Payer: Medicare PPO | Source: Ambulatory Visit | Attending: Gastroenterology | Admitting: Gastroenterology

## 2020-11-21 DIAGNOSIS — Z881 Allergy status to other antibiotic agents status: Secondary | ICD-10-CM | POA: Insufficient documentation

## 2020-11-21 DIAGNOSIS — K635 Polyp of colon: Secondary | ICD-10-CM | POA: Diagnosis not present

## 2020-11-21 DIAGNOSIS — D122 Benign neoplasm of ascending colon: Secondary | ICD-10-CM | POA: Insufficient documentation

## 2020-11-21 DIAGNOSIS — Z79899 Other long term (current) drug therapy: Secondary | ICD-10-CM | POA: Diagnosis not present

## 2020-11-21 DIAGNOSIS — Z885 Allergy status to narcotic agent status: Secondary | ICD-10-CM | POA: Insufficient documentation

## 2020-11-21 DIAGNOSIS — Z90710 Acquired absence of both cervix and uterus: Secondary | ICD-10-CM | POA: Diagnosis not present

## 2020-11-21 DIAGNOSIS — E785 Hyperlipidemia, unspecified: Secondary | ICD-10-CM | POA: Insufficient documentation

## 2020-11-21 DIAGNOSIS — Z803 Family history of malignant neoplasm of breast: Secondary | ICD-10-CM | POA: Insufficient documentation

## 2020-11-21 DIAGNOSIS — R194 Change in bowel habit: Secondary | ICD-10-CM | POA: Diagnosis present

## 2020-11-21 DIAGNOSIS — D125 Benign neoplasm of sigmoid colon: Secondary | ICD-10-CM | POA: Diagnosis not present

## 2020-11-21 DIAGNOSIS — Z882 Allergy status to sulfonamides status: Secondary | ICD-10-CM | POA: Diagnosis not present

## 2020-11-21 HISTORY — PX: COLONOSCOPY WITH PROPOFOL: SHX5780

## 2020-11-21 SURGERY — COLONOSCOPY WITH PROPOFOL
Anesthesia: General

## 2020-11-21 MED ORDER — PROPOFOL 500 MG/50ML IV EMUL
INTRAVENOUS | Status: AC
  Filled 2020-11-21: qty 50

## 2020-11-21 MED ORDER — LIDOCAINE 2% (20 MG/ML) 5 ML SYRINGE
INTRAMUSCULAR | Status: DC | PRN
  Administered 2020-11-21: 50 mg via INTRAVENOUS

## 2020-11-21 MED ORDER — PHENYLEPHRINE HCL (PRESSORS) 10 MG/ML IV SOLN
INTRAVENOUS | Status: DC | PRN
  Administered 2020-11-21 (×3): 100 ug via INTRAVENOUS

## 2020-11-21 MED ORDER — LIDOCAINE HCL (PF) 2 % IJ SOLN
INTRAMUSCULAR | Status: AC
  Filled 2020-11-21: qty 5

## 2020-11-21 MED ORDER — PROPOFOL 500 MG/50ML IV EMUL
INTRAVENOUS | Status: DC | PRN
  Administered 2020-11-21: 150 ug/kg/min via INTRAVENOUS

## 2020-11-21 MED ORDER — PROPOFOL 10 MG/ML IV BOLUS
INTRAVENOUS | Status: DC | PRN
  Administered 2020-11-21: 70 mg via INTRAVENOUS

## 2020-11-21 MED ORDER — SODIUM CHLORIDE 0.9 % IV SOLN
INTRAVENOUS | Status: DC
  Administered 2020-11-21: 20 mL/h via INTRAVENOUS

## 2020-11-21 NOTE — Anesthesia Postprocedure Evaluation (Signed)
Anesthesia Post Note  Patient: Patricia Oliver  Procedure(s) Performed: COLONOSCOPY WITH PROPOFOL (N/A )  Patient location during evaluation: Endoscopy Anesthesia Type: General Level of consciousness: awake and alert Pain management: pain level controlled Vital Signs Assessment: post-procedure vital signs reviewed and stable Respiratory status: spontaneous breathing, nonlabored ventilation, respiratory function stable and patient connected to nasal cannula oxygen Cardiovascular status: blood pressure returned to baseline and stable Postop Assessment: no apparent nausea or vomiting Anesthetic complications: no   No complications documented.   Last Vitals:  Vitals:   11/21/20 0932 11/21/20 1000  BP: (!) 88/55 139/85  Pulse:    Resp:    Temp:    SpO2:      Last Pain:  Vitals:   11/21/20 1000  TempSrc:   PainSc: 0-No pain                 Precious Haws Zahrah Sutherlin

## 2020-11-21 NOTE — Op Note (Signed)
[facility] Gastroenterology Patient Name: Patricia Oliver Procedure Date: 11/21/2020 8:24 AM MRN: [id number] Account #: [id number] Date of Birth: [birth year] Admit Type: Outpatient Age: 70 Room: [facility] ENDO ROOM 4 Gender: Female Note Status: Finalized Procedure:             Colonoscopy Indications:           Change in bowel habits Providers:             Wyline Mood MD, MD Referring MD:          Hassell Halim MD (Referring MD) Medicines:             Monitored Anesthesia Care Complications:         No immediate complications. Procedure:             Pre-Anesthesia Assessment:                        - Prior to the procedure, a History and Physical was                         performed, and patient medications, allergies and                         sensitivities were reviewed. The patient's tolerance                         of previous anesthesia was reviewed.                        - The risks and benefits of the procedure and the                         sedation options and risks were discussed with the                         patient. All questions were answered and informed                         consent was obtained.                        - ASA Grade Assessment: II - A patient with mild                         systemic disease.                        After obtaining informed consent, the colonoscope was                         passed under direct vision. Throughout the procedure,                         the patient's blood pressure, pulse, and oxygen                         saturations were monitored continuously. The                         Colonoscope was introduced through the anus and  advanced to the the cecum, identified by the                         appendiceal orifice. The quality of the bowel                         preparation was good. The colonoscopy was performed                         with moderate difficulty due to  significant looping.                         Successful completion of the procedure was aided by                         withdrawing the scope and replacing with the pediatric                         colonoscope. Findings:      The perianal and digital rectal examinations were normal.      Multiple small-mouthed diverticula were found in the sigmoid colon.      A 12 mm polyp was found in the ascending colon. The polyp was sessile.       The polyp was removed with a cold snare. Resection and retrieval were       complete.      A 3 mm polyp was found in the sigmoid colon. The polyp was sessile. The       polyp was removed with a cold biopsy forceps. Resection and retrieval       were complete.      A 8 mm polyp was found in the sigmoid colon. The polyp was sessile. The       polyp was removed with a cold snare. Resection and retrieval were       complete. To prevent bleeding after the polypectomy, one hemostatic clip       was successfully placed. There was no bleeding at the end of the       procedure.      Normal mucosa was found in the entire colon. Biopsies were taken with a       cold forceps for histology. Impression:            - Diverticulosis in the sigmoid colon.                        - One 12 mm polyp in the ascending colon, removed with                         a cold snare. Resected and retrieved.                        - One 3 mm polyp in the sigmoid colon, removed with a                         cold biopsy forceps. Resected and retrieved.                        - One 8 mm polyp in the sigmoid colon, removed with a  cold snare. Resected and retrieved. Clip was placed.                        - Normal mucosa in the entire examined colon. Biopsied. Recommendation:        - Discharge patient to home (with escort).                        - Resume previous diet.                        - Continue present medications.                        - Await pathology  results.                        - Repeat colonoscopy for surveillance based on                         pathology results. Procedure Code(s):     --- Professional ---                        [phone], Colonoscopy, flexible; with removal of                         tumor(s), polyp(s), or other lesion(s) by snare                         technique                        45380, 59, Colonoscopy, flexible; with biopsy, single                         or multiple Diagnosis Code(s):     --- Professional ---                        K63.5, Polyp of colon                        R19.4, Change in bowel habit                        K57.30, Diverticulosis of large intestine without                         perforation or abscess without bleeding CPT copyright 2019 American Medical Association. All rights reserved. The codes documented in this report are preliminary and upon coder review may  be revised to meet current compliance requirements. Wyline Mood, MD Wyline Mood MD, MD 11/21/2020 9:29:45 AM This report has been signed electronically. Number of Addenda: 0 Note Initiated On: 11/21/2020 8:24 AM Scope Withdrawal Time: 0 hours 22 minutes 25 seconds  Total Procedure Duration: 0 hours 47 minutes 11 seconds       [facility]

## 2020-11-21 NOTE — Anesthesia Preprocedure Evaluation (Signed)
Anesthesia Evaluation  Patient identified by MRN, date of birth, ID band Patient awake    Reviewed: Allergy & Precautions, H&P , NPO status , Patient's Chart, lab work & pertinent test results  History of Anesthesia Complications (+) PONV and history of anesthetic complications (PONV with morphine)  Airway Mallampati: III  TM Distance: <3 FB Neck ROM: limited    Dental  (+) Chipped   Pulmonary neg pulmonary ROS,    Pulmonary exam normal        Cardiovascular Exercise Tolerance: Good (-) angina(-) Past MI Normal cardiovascular exam+ dysrhythmias (Pt denies)      Neuro/Psych negative neurological ROS  negative psych ROS   GI/Hepatic Neg liver ROS, GERD  Controlled and Medicated,  Endo/Other  negative endocrine ROS  Renal/GU negative Renal ROS  negative genitourinary   Musculoskeletal   Abdominal   Peds  Hematology negative hematology ROS (+)   Anesthesia Other Findings Past Medical History: No date: Dysrhythmia No date: GERD (gastroesophageal reflux disease) No date: Hyperlipidemia  Past Surgical History: No date: ABDOMINAL HYSTERECTOMY No date: APPENDECTOMY 1996: BREAST BIOPSY; Left     Comment:  neg 12/10/2014: COLONOSCOPY; N/A     Comment:  Procedure: COLONOSCOPY;  Surgeon: Lollie Sails, MD;              Location: [facility] ENDOSCOPY;  Service: Endoscopy;                Laterality: N/A; No date: ORIF ANKLE FRACTURE  BMI    Body Mass Index: 23.86 kg/m      Reproductive/Obstetrics negative OB ROS                             Anesthesia Physical  Anesthesia Plan  ASA: III  Anesthesia Plan: General   Post-op Pain Management:    Induction: Intravenous  PONV Risk Score and Plan: Propofol infusion and TIVA  Airway Management Planned: Nasal Cannula  Additional Equipment:   Intra-op Plan:   Post-operative Plan:   Informed Consent: I have reviewed the patients  History and Physical, chart, labs and discussed the procedure including the risks, benefits and alternatives for the proposed anesthesia with the patient or authorized representative who has indicated his/her understanding and acceptance.     Dental Advisory Given  Plan Discussed with: Anesthesiologist, CRNA and Surgeon  Anesthesia Plan Comments: (Patient consented for risks of anesthesia including but not limited to:  - adverse reactions to medications - risk of airway placement if required - damage to eyes, teeth, lips or other oral mucosa - nerve damage due to positioning  - sore throat or hoarseness - Damage to heart, brain, nerves, lungs, other parts of body or loss of life  Patient voiced understanding.)        Anesthesia Quick Evaluation

## 2020-11-21 NOTE — H&P (Signed)
[name], MD [address], [place], [place], [place], [address] [address], [place], [place], [place], [address] Phone: [phone]  Fax: [phone]  Primary Care Physician:  Derinda Late, MD   Pre-Procedure History & Physical: HPI:  Patricia Oliver is a 70 y.o. female is here for an colonoscopy.   Past Medical History:  Diagnosis Date  . Dysrhythmia   . GERD (gastroesophageal reflux disease)   . Hyperlipidemia     Past Surgical History:  Procedure Laterality Date  . ABDOMINAL HYSTERECTOMY    . APPENDECTOMY    . BREAST BIOPSY Left 1996   neg  . COLONOSCOPY N/A 12/10/2014   Procedure: COLONOSCOPY;  Surgeon: Lollie Sails, MD;  Location: [facility] ENDOSCOPY;  Service: Endoscopy;  Laterality: N/A;  . ORIF ANKLE FRACTURE      Prior to Admission medications   Medication Sig Start Date End Date Taking? Authorizing Provider  Ascorbic Acid (VITAMIN C) 100 MG tablet Take 400 mg by mouth daily.   Yes [provider]  azelastine (ASTELIN) 0.1 % nasal spray Place 1 spray into both nostrils 2 (two) times daily. Use in each nostril as directed   Yes [provider]  azelastine (OPTIVAR) 0.05 % ophthalmic solution PLACE 1 DROP INTO BOTH EYES ONCE DAILY. 01/14/20 01/13/21 Yes Derinda Late, MD  azithromycin (ZITHROMAX Z-PAK) 250 MG tablet 2 pills today then 1 pill a day for 4 days for sinusitis 02/06/17  Yes Fisher, Linden Dolin, PA-C  calcium citrate-vitamin D (CITRACAL+D) 315-200 MG-UNIT per tablet Take 2 tablets by mouth 2 (two) times daily.   Yes [provider]  celecoxib (CELEBREX) 200 MG capsule Take by mouth. 10/14/15  Yes [provider]  dicyclomine (BENTYL) 10 MG capsule Take 1 capsule (10 mg total) by mouth 4 (four) times daily -  with meals and at bedtime. 10/26/20 02/02/21 Yes [name], MD  fluticasone [facility]) 50 MCG/ACT nasal spray Place 2 sprays into both nostrils daily.   Yes [provider]  glucosamine-chondroitin  500-400 MG tablet Take 1 tablet by mouth 3 (three) times daily.   Yes [provider]  guaifenesin (HUMIBID E) 400 MG TABS tablet Take 400 mg by mouth daily. For allergies for maple and pine trees   Yes [provider]  levocetirizine (XYZAL) 5 MG tablet Take 5 mg by mouth every evening.   Yes [provider]  montelukast (SINGULAIR) 10 MG tablet TAKE 1 TABLET BY MOUTH ONCE DAILY 06/15/20 06/15/21 Yes Derinda Late, MD  ondansetron (ZOFRAN) 4 MG tablet Take 1 tablet (4 mg total) by mouth every 8 (eight) hours as needed for nausea or vomiting. 02/06/17  Yes Fisher, Linden Dolin, PA-C  predniSONE (DELTASONE) 10 MG tablet Take 3 tablets (30 mg total) by mouth daily with breakfast. 11/01/16  Yes Fisher, Linden Dolin, PA-C  scopolamine (TRANSDERM-SCOP, 1.5 MG,) 1 MG/3DAYS Place 1 patch (1.5 mg total) onto the skin every 3 (three) days. 01/17/17  Yes Fisher, Linden Dolin, PA-C  vitamin E 400 UNIT capsule Take 400 Units by mouth daily.   Yes [provider]  YUVAFEM 10 MCG TABS vaginal tablet  11/26/16  Yes [provider]  YUVAFEM 10 MCG TABS vaginal tablet PLACE 1 TABLET VAGINALLY TWICE A WEEK. 06/15/20 06/15/21 Yes Derinda Late, MD  zolpidem (AMBIEN) 5 MG tablet TAKE 1 TABLET BY MOUTH NIGHTLY AS NEEDED FOR SLEEP 07/01/20 12/28/20 Yes Derinda Late, MD  amoxicillin (AMOXIL) 875 MG tablet Take 1 tablet (875 mg  total) by mouth 2 (two) times daily. Patient not taking: No sig reported 11/07/16   Versie Starks, PA-C  azelastine (ASTELIN) 0.1 % nasal spray PLACE 1 SPRAY INTO BOTH NOSTRILS ONCE DAILY 10/06/19 10/05/20  Derinda Late, MD  benzonatate (TESSALON) 200 MG capsule Take 1 capsule (200 mg total) by mouth 2 (two) times daily as needed for cough. Patient not taking: No sig reported 11/07/16   Versie Starks, PA-C  celecoxib (CELEBREX) 200 MG capsule TAKE 1 CAPSULE BY MOUTH TWICE DAILY Patient not taking: Reported on 10/26/2020 08/09/20 08/09/21  Derinda Late, MD   fluticasone (FLONASE) 50 MCG/ACT nasal spray PLACE 2 SPRAYS INTO BOTH NOSTRILS ONCE DAILY. Patient not taking: Reported on 10/26/2020 05/19/20 05/19/21  Derinda Late, MD  simvastatin (ZOCOR) 40 MG tablet Take by mouth. 10/14/15   [provider]  simvastatin (ZOCOR) 40 MG tablet TAKE 1 TABLET BY MOUTH ONCE DAILY 09/17/19 09/16/20  Derinda Late, MD    Allergies as of 11/10/2020 - Review Complete 10/26/2020  Allergen Reaction Noted  . Morphine and related Nausea And Vomiting 12/09/2014  . Ciprofloxacin Swelling and Rash 12/01/2014  . Elemental sulfur Itching, Swelling, and Rash 12/01/2014    Family History  Problem Relation Age of Onset  . Breast cancer Sister 7  . Breast cancer Paternal Aunt   . Breast cancer Paternal Aunt   . Breast cancer Sister 77       dx twice with breast ca    Social History   Socioeconomic History  . Marital status: Married    Spouse name: Not on file  . Number of children: Not on file  . Years of education: Not on file  . Highest education level: Not on file  Occupational History  . Not on file  Tobacco Use  . Smoking status: Never Smoker  . Smokeless tobacco: Never Used  Substance and Sexual Activity  . Alcohol use: Not on file  . Drug use: Not on file  . Sexual activity: Not on file  Other Topics Concern  . Not on file  Social History Narrative  . Not on file   Social Determinants of Health   Financial Resource Strain: Not on file  Food Insecurity: Not on file  Transportation Needs: Not on file  Physical Activity: Not on file  Stress: Not on file  Social Connections: Not on file  Intimate Partner Violence: Not on file    Review of Systems: See HPI, otherwise negative ROS  Physical Exam: BP (!) 139/92   Pulse 81   Temp 97.7 F (36.5 C) (Temporal)   Resp 20   Ht 5\' 4"  (1.626 m)   Wt 63 kg   SpO2 99%   BMI 23.86 kg/m  General:   Alert,  pleasant and cooperative in NAD Head:  Normocephalic and atraumatic. Neck:   Supple; no masses or thyromegaly. Lungs:  Clear throughout to auscultation, normal respiratory effort.    Heart:  +S1, +S2, Regular rate and rhythm, No edema. Abdomen:  Soft, nontender and nondistended. Normal bowel sounds, without guarding, and without rebound.   Neurologic:  Alert and  oriented x4;  grossly normal neurologically.  Impression/Plan: Patricia Oliver is here for an colonoscopy to be performed for diarrhea.  Risks, benefits, limitations, and alternatives regarding  colonoscopy have been reviewed with the patient.  Questions have been answered.  All parties agreeable.   [name], MD  11/21/2020, 8:19 AM

## 2020-11-21 NOTE — Transfer of Care (Signed)
Immediate Anesthesia Transfer of Care Note  Patient: Patricia Oliver  Procedure(s) Performed: COLONOSCOPY WITH PROPOFOL (N/A )  Patient Location: Endoscopy Unit  Anesthesia Type:General  Level of Consciousness: awake  Airway & Oxygen Therapy: Patient Spontanous Breathing  Post-op Assessment: Post -op Vital signs reviewed and stable  Post vital signs: stable  Last Vitals:  Vitals Value Taken Time  BP 88/55 11/21/20 0933  Temp 36.5 C 11/21/20 0930  Pulse 77 11/21/20 0933  Resp 16 11/21/20 0933  SpO2 100 % 11/21/20 0933  Vitals shown include unvalidated device data.  Last Pain:  Vitals:   11/21/20 0930  TempSrc: Temporal  PainSc: 0-No pain         Complications: No complications documented.

## 2020-11-22 ENCOUNTER — Encounter: Payer: Self-pay | Admitting: Gastroenterology

## 2020-11-22 LAB — SURGICAL PATHOLOGY

## 2020-11-23 ENCOUNTER — Other Ambulatory Visit: Payer: Self-pay

## 2020-11-23 MED ORDER — FAMOTIDINE 40 MG PO TABS
ORAL_TABLET | ORAL | 1 refills | Status: DC
  Filled 2020-11-23: qty 180, 90d supply, fill #0
  Filled 2021-05-23: qty 180, 90d supply, fill #1

## 2020-11-25 ENCOUNTER — Encounter: Payer: Self-pay | Admitting: Gastroenterology

## 2020-12-08 ENCOUNTER — Other Ambulatory Visit: Payer: Self-pay

## 2020-12-08 MED ORDER — AZELASTINE HCL 0.05 % OP SOLN
OPHTHALMIC | 5 refills | Status: DC
  Filled 2020-12-08: qty 6, 30d supply, fill #0

## 2020-12-09 ENCOUNTER — Other Ambulatory Visit: Payer: Self-pay

## 2020-12-09 MED ORDER — AZELASTINE HCL 0.05 % OP SOLN
OPHTHALMIC | 5 refills | Status: DC
  Filled 2020-12-09: qty 6, 10d supply, fill #0
  Filled 2021-01-31: qty 6, 30d supply, fill #0

## 2020-12-12 ENCOUNTER — Other Ambulatory Visit: Payer: Self-pay

## 2020-12-12 MED ORDER — FLUTICASONE PROPIONATE 50 MCG/ACT NA SUSP
NASAL | 1 refills | Status: DC
  Filled 2020-12-12 – 2021-01-02 (×3): qty 48, 90d supply, fill #0
  Filled 2021-05-25: qty 48, 90d supply, fill #1

## 2020-12-12 MED FILL — Montelukast Sodium Tab 10 MG (Base Equiv): ORAL | 90 days supply | Qty: 90 | Fill #0 | Status: AC

## 2020-12-15 ENCOUNTER — Other Ambulatory Visit: Payer: Self-pay

## 2021-01-02 ENCOUNTER — Other Ambulatory Visit: Payer: Self-pay

## 2021-01-03 ENCOUNTER — Telehealth (INDEPENDENT_AMBULATORY_CARE_PROVIDER_SITE_OTHER): Payer: Medicare PPO | Admitting: Gastroenterology

## 2021-01-03 ENCOUNTER — Encounter: Payer: Self-pay | Admitting: Gastroenterology

## 2021-01-03 ENCOUNTER — Other Ambulatory Visit: Payer: Self-pay

## 2021-01-03 DIAGNOSIS — R194 Change in bowel habit: Secondary | ICD-10-CM | POA: Diagnosis not present

## 2021-01-03 DIAGNOSIS — K580 Irritable bowel syndrome with diarrhea: Secondary | ICD-10-CM | POA: Diagnosis not present

## 2021-01-03 NOTE — Progress Notes (Signed)
Patricia Oliver , MD [address]  [place]  [place], [place] [address]  Main: [phone]  Fax: [phone]   Primary Care Physician: Patricia Late, MD  Virtual Visit via Video Note  I connected with patient on 01/03/21 at  1:45 PM EDT by video and verified that I am speaking with the correct person using two identifiers.   I discussed the limitations, risks, security and privacy concerns of performing an evaluation and management service by video  and the availability of in person appointments. I also discussed with the patient that there may be a patient responsible charge related to this service. The patient expressed understanding and agreed to proceed.  Location of Patient: Home Location of Provider: Home Persons involved: Patient and provider only   History of Present Illness:   Follow-up for change in bowel habits   HPI: Patricia Oliver is a 70 y.o. female   Summary of history :  Initially referred and seen on 10/26/2020 for diarrhea.  Began a few months prior to initial presentation.  3-4 times a day.  Usually in the morning after she wakes up.  Was consuming sweet tea half a cup of artificial sugar Stevia in 1 gallon daily.  Associated with a lot of gas.  Taking Celebrex 1 tablet daily for many years.  Interval history   10/26/2020-01/03/2021  11/21/2020: Colonoscopy: 3 polyps resected in the ascending and sigmoid colon.  Random colon biopsies showed no evidence of microscopic colitis.  The polyps were sessile serrated polyps x2  Since her last visit she has stopped taking Celebrex with no joint pains.  She has stopped all the artificial sugars in the diet.  Still has some change in the consistency of her stools each morning she states that it is not diarrhea but just a change in the shape.  She was having gas and bloating issues previously but not having them right now.  Takes Bentyl twice a day.  Which she said he thinks helps her   Current Outpatient Medications   Medication Sig Dispense Refill   amoxicillin (AMOXIL) 875 MG tablet Take 1 tablet (875 mg total) by mouth 2 (two) times daily. (Patient not taking: No sig reported) 20 tablet 0   Ascorbic Acid (VITAMIN C) 100 MG tablet Take 400 mg by mouth daily.     azelastine (ASTELIN) 0.1 % nasal spray Place 1 spray into both nostrils 2 (two) times daily. Use in each nostril as directed     azelastine (ASTELIN) 0.1 % nasal spray PLACE 1 SPRAY INTO BOTH NOSTRILS ONCE DAILY 90 mL 3   azelastine (OPTIVAR) 0.05 % ophthalmic solution Place 1 drop into both eyes once daily 6 mL 5   azelastine (OPTIVAR) 0.05 % ophthalmic solution PLACE 1 DROP INTO BOTH EYES ONCE DAILY. 6 mL 5   azithromycin (ZITHROMAX Z-PAK) 250 MG tablet 2 pills today then 1 pill a day for 4 days for sinusitis 6 each 0   benzonatate (TESSALON) 200 MG capsule Take 1 capsule (200 mg total) by mouth 2 (two) times daily as needed for cough. (Patient not taking: No sig reported) 20 capsule 0   calcium citrate-vitamin D (CITRACAL+D) 315-200 MG-UNIT per tablet Take 2 tablets by mouth 2 (two) times daily.     celecoxib (CELEBREX) 200 MG capsule Take by mouth.     celecoxib (CELEBREX) 200 MG capsule TAKE 1 CAPSULE BY MOUTH TWICE DAILY (Patient not taking: Reported on 10/26/2020) 180 capsule 1   dicyclomine (BENTYL)  10 MG capsule Take 1 capsule (10 mg total) by mouth 4 (four) times daily -  with meals and at bedtime. 360 capsule 0   famotidine (PEPCID) 40 MG tablet Take 1 tablet (40 mg total) by mouth 2 (two) times daily as needed for Heartburn 180 tablet 1   fluticasone (FLONASE) 50 MCG/ACT nasal spray Place 2 sprays into both nostrils daily.     fluticasone (FLONASE) 50 MCG/ACT nasal spray PLACE 2 SPRAYS INTO BOTH NOSTRILS ONCE DAILY. 48 g 1   glucosamine-chondroitin 500-400 MG tablet Take 1 tablet by mouth 3 (three) times daily.     guaifenesin (HUMIBID E) 400 MG TABS tablet Take 400 mg by mouth daily. For allergies for maple and pine trees      levocetirizine (XYZAL) 5 MG tablet Take 5 mg by mouth every evening.     montelukast (SINGULAIR) 10 MG tablet TAKE 1 TABLET BY MOUTH ONCE DAILY 90 tablet 3   ondansetron (ZOFRAN) 4 MG tablet Take 1 tablet (4 mg total) by mouth every 8 (eight) hours as needed for nausea or vomiting. 20 tablet 0   predniSONE (DELTASONE) 10 MG tablet Take 3 tablets (30 mg total) by mouth daily with breakfast. 9 tablet 0   scopolamine (TRANSDERM-SCOP, 1.5 MG,) 1 MG/3DAYS Place 1 patch (1.5 mg total) onto the skin every 3 (three) days. 10 patch 12   simvastatin (ZOCOR) 40 MG tablet Take by mouth.     simvastatin (ZOCOR) 40 MG tablet TAKE 1 TABLET BY MOUTH ONCE DAILY 90 tablet 3   vitamin E 400 UNIT capsule Take 400 Units by mouth daily.     YUVAFEM 10 MCG TABS vaginal tablet   3   YUVAFEM 10 MCG TABS vaginal tablet PLACE 1 TABLET VAGINALLY TWICE A WEEK. 24 tablet 1   zolpidem (AMBIEN) 5 MG tablet TAKE 1 TABLET BY MOUTH NIGHTLY AS NEEDED FOR SLEEP 30 tablet 0   No current facility-administered medications for this visit.    Allergies as of 01/03/2021 - Review Complete 11/21/2020  Allergen Reaction Noted   Morphine and related Nausea And Vomiting 12/09/2014   Ciprofloxacin Swelling and Rash 12/01/2014   Elemental sulfur Itching, Swelling, and Rash 12/01/2014    Review of Systems:    All systems reviewed and negative except where noted in HPI.  General Appearance:    Alert, cooperative, no distress, appears stated age  Head:    Normocephalic, without obvious abnormality, atraumatic  Eyes:    PERRL, conjunctiva/corneas clear,  Ears:    Grossly normal hearing    Neurologic:  Grossly normal    Observations/Objective:  Labs: CMP     Component Value Date/Time   CREATININE 0.70 08/03/2016 1618   Lab Results  Component Value Date   WBC 4.3 11/01/2016   HGB 11.7 (L) 11/01/2016   HCT 37.5 11/01/2016   MCV 70.9 (L) 11/01/2016   PLT 222 11/01/2016    Imaging Studies: No results found.  Assessment and  Plan:   Patricia Oliver is a 70 y.o. y/o female here to follow-up for change in bowel habits with diarrhea.  At her initial visit I felt that her symptoms were predominantly due to osmotic diarrhea from consuming a large quantity of artificial sugars and possibly due to long-term use of Celebrex which can cause a colitis.  Since she stopped the Celebrex and artificial sugars she has seen some improvement but not complete resolution.  Colonoscopy did not show any evidence of microscopic colitis.  I will proceed  with checking her for celiac disease but have explained to her that she may have IBS diarrhea.  Options at this time would be to increase dietary fiber to a target of 25 g/day.  We will provide her information from [facility] to achieve target and she can use a supplement such as Metamucil or Citrucel which I explained to her to reach her target.  If no better I have advised her to call me and inform me and I can consider giving her a course of Xifaxan for IBS diarrhea.  Obviously I do not want to try an antibiotic if we can avoid 1.     I discussed the assessment and treatment plan with the patient. The patient was provided an opportunity to ask questions and all were answered. The patient agreed with the plan and demonstrated an understanding of the instructions.   The patient was advised to call back or seek an in-person evaluation if the symptoms worsen or if the condition fails to improve as anticipated.  I provided 20 minutes of face-to-face time during this encounter.  Dr Patricia Bellows MD,MRCP [facility]) Gastroenterology/Hepatology Pager: [phone]   Speech recognition software was used to dictate this note.

## 2021-01-20 ENCOUNTER — Ambulatory Visit: Payer: Medicare PPO | Attending: Internal Medicine

## 2021-01-20 ENCOUNTER — Other Ambulatory Visit: Payer: Self-pay

## 2021-01-20 DIAGNOSIS — Z23 Encounter for immunization: Secondary | ICD-10-CM

## 2021-01-20 MED ORDER — PFIZER-BIONT COVID-19 VAC-TRIS 30 MCG/0.3ML IM SUSP
INTRAMUSCULAR | 0 refills | Status: DC
  Filled 2021-01-20: qty 0.3, 1d supply, fill #0

## 2021-01-20 NOTE — Progress Notes (Signed)
Covid-19 Vaccination Clinic  Name:  Patricia Oliver    MRN: [id number] DOB: [birth year]  01/20/2021  Patricia Oliver was observed post Covid-19 immunization for 15 minutes without incident. She was provided with Vaccine Information Sheet and instruction to access the V-Safe system.   Patricia Oliver was instructed to call 911 with any severe reactions post vaccine: Difficulty breathing  Swelling of face and throat  A fast heartbeat  A bad rash all over body  Dizziness and weakness   Immunizations Administered     Name Date Dose VIS Date Route   PFIZER Comrnaty(Gray TOP) Covid-19 Vaccine 01/20/2021  2:31 PM 0.3 mL 06/30/2020 Intramuscular   Manufacturer: [employer], [employer]   Lot: [id number]   NDC: [address]-6      Drusilla Kanner, PharmD, MBA Clinical Acute Care Pharmacist

## 2021-01-31 ENCOUNTER — Other Ambulatory Visit: Payer: Self-pay

## 2021-02-02 LAB — CELIAC DISEASE AB SCREEN W/RFX
Antigliadin Abs, IgA: 4 units (ref 0–19)
IgA/Immunoglobulin A, Serum: 133 mg/dL (ref 87–352)
Transglutaminase IgA: <2 U/mL (ref 0–3)

## 2021-02-10 ENCOUNTER — Telehealth: Payer: Self-pay | Admitting: Gastroenterology

## 2021-02-10 NOTE — Telephone Encounter (Signed)
Patient lvm and states she has left several messages. Patient wants lab results.

## 2021-02-13 ENCOUNTER — Telehealth: Payer: Self-pay | Admitting: Gastroenterology

## 2021-02-13 NOTE — Telephone Encounter (Signed)
Note has already been forwarded to [facility] this is Dr Vicente Males pt

## 2021-02-13 NOTE — Telephone Encounter (Signed)
Patient wants to discuss results with CMA. Clinical staff will follow up with patient.

## 2021-02-13 NOTE — Telephone Encounter (Signed)
          Results (Newest Message First) Devontenno, Tera Partridge, CMA routed conversation to You 8 hours ago (10:17 AM)   [name], MD  Devontenno, Tera Partridge, CMA 8 hours ago (10:15 AM)   [facility]  Celiac blood test ordered after video visit is normal       Called patient back but had to leave her a detailed message letting her know that her lab results were normal and if she had further questions, to please call us back.

## 2021-02-27 ENCOUNTER — Other Ambulatory Visit: Payer: Self-pay

## 2021-02-27 MED ORDER — SIMVASTATIN 40 MG PO TABS
ORAL_TABLET | ORAL | 3 refills | Status: AC
  Filled 2021-02-27: qty 90, 90d supply, fill #0
  Filled 2022-02-05: qty 90, 90d supply, fill #1

## 2021-02-28 ENCOUNTER — Other Ambulatory Visit: Payer: Self-pay

## 2021-02-28 MED ORDER — PREDNISONE 10 MG PO TABS
10.0000 mg | ORAL_TABLET | Freq: Every day | ORAL | 0 refills | Status: DC
  Filled 2021-02-28: qty 10, 10d supply, fill #0

## 2021-03-09 ENCOUNTER — Other Ambulatory Visit: Payer: Self-pay

## 2021-03-09 MED FILL — Montelukast Sodium Tab 10 MG (Base Equiv): ORAL | 90 days supply | Qty: 90 | Fill #1 | Status: AC

## 2021-03-22 ENCOUNTER — Other Ambulatory Visit: Payer: Self-pay | Admitting: Family Medicine

## 2021-03-22 ENCOUNTER — Other Ambulatory Visit: Payer: Self-pay

## 2021-03-22 DIAGNOSIS — N644 Mastodynia: Secondary | ICD-10-CM

## 2021-03-22 MED ORDER — AZELASTINE HCL 0.05 % OP SOLN
OPHTHALMIC | 5 refills | Status: DC
  Filled 2021-03-22: qty 6, 45d supply, fill #0
  Filled 2021-05-01: qty 6, 45d supply, fill #1
  Filled 2021-07-12: qty 6, 45d supply, fill #2
  Filled 2021-08-31: qty 6, 45d supply, fill #3
  Filled 2021-12-06: qty 6, 30d supply, fill #4
  Filled 2022-02-05: qty 6, 30d supply, fill #5

## 2021-03-22 MED ORDER — CELECOXIB 200 MG PO CAPS
ORAL_CAPSULE | ORAL | 1 refills | Status: DC
  Filled 2021-03-22: qty 180, 90d supply, fill #0

## 2021-03-24 ENCOUNTER — Other Ambulatory Visit: Payer: Self-pay

## 2021-03-24 ENCOUNTER — Ambulatory Visit
Admission: RE | Admit: 2021-03-24 | Discharge: 2021-03-24 | Disposition: A | Discharge status: Home / Self Care | Payer: Medicare PPO | Source: Ambulatory Visit | Attending: Family Medicine | Admitting: Family Medicine

## 2021-03-24 DIAGNOSIS — N644 Mastodynia: Secondary | ICD-10-CM | POA: Diagnosis present

## 2021-03-29 ENCOUNTER — Other Ambulatory Visit: Payer: Self-pay | Admitting: Family Medicine

## 2021-03-29 DIAGNOSIS — N6320 Unspecified lump in the left breast, unspecified quadrant: Secondary | ICD-10-CM

## 2021-03-29 DIAGNOSIS — R928 Other abnormal and inconclusive findings on diagnostic imaging of breast: Secondary | ICD-10-CM

## 2021-04-06 ENCOUNTER — Other Ambulatory Visit: Payer: Self-pay

## 2021-04-06 ENCOUNTER — Ambulatory Visit
Admission: RE | Admit: 2021-04-06 | Discharge: 2021-04-06 | Disposition: A | Discharge status: Home / Self Care | Payer: Medicare PPO | Source: Ambulatory Visit | Attending: Family Medicine | Admitting: Family Medicine

## 2021-04-06 DIAGNOSIS — R928 Other abnormal and inconclusive findings on diagnostic imaging of breast: Secondary | ICD-10-CM

## 2021-04-06 DIAGNOSIS — N6320 Unspecified lump in the left breast, unspecified quadrant: Secondary | ICD-10-CM

## 2021-04-06 HISTORY — PX: BREAST BIOPSY: SHX20

## 2021-04-07 LAB — SURGICAL PATHOLOGY

## 2021-04-14 ENCOUNTER — Other Ambulatory Visit: Payer: Self-pay

## 2021-04-14 ENCOUNTER — Ambulatory Visit: Payer: Medicare PPO | Attending: Internal Medicine

## 2021-04-14 DIAGNOSIS — Z23 Encounter for immunization: Secondary | ICD-10-CM

## 2021-04-14 MED ORDER — PFIZER COVID-19 VAC BIVALENT 30 MCG/0.3ML IM SUSP
INTRAMUSCULAR | 0 refills | Status: DC
  Filled 2021-04-14: qty 0.3, 1d supply, fill #0

## 2021-04-14 NOTE — Progress Notes (Signed)
Covid-19 Vaccination Clinic  Name:  Patricia Oliver    MRN: [id number] DOB: 1951/02/23  04/14/2021  [name] was observed post Covid-19 immunization for 15 minutes without incident. She was provided with Vaccine Information Sheet and instruction to access the V-Safe system.   [name] was instructed to call 911 with any severe reactions post vaccine: Difficulty breathing  Swelling of face and throat  A fast heartbeat  A bad rash all over body  Dizziness and weakness   Drusilla Kanner, PharmD, MBA Clinical Acute Care Pharmacist

## 2021-04-25 ENCOUNTER — Other Ambulatory Visit: Payer: Self-pay

## 2021-04-25 MED ORDER — DICYCLOMINE HCL 10 MG PO CAPS
ORAL_CAPSULE | ORAL | 0 refills | Status: DC
  Filled 2021-04-25: qty 360, 90d supply, fill #0

## 2021-05-01 ENCOUNTER — Other Ambulatory Visit: Payer: Self-pay

## 2021-05-09 ENCOUNTER — Other Ambulatory Visit: Payer: Self-pay | Admitting: Family Medicine

## 2021-05-09 DIAGNOSIS — Z1231 Encounter for screening mammogram for malignant neoplasm of breast: Secondary | ICD-10-CM

## 2021-05-23 ENCOUNTER — Other Ambulatory Visit: Payer: Self-pay

## 2021-05-25 ENCOUNTER — Other Ambulatory Visit: Payer: Self-pay

## 2021-05-25 MED ORDER — AZELASTINE HCL 0.1 % NA SOLN
NASAL | 3 refills | Status: DC
  Filled 2021-05-25: qty 90, 90d supply, fill #0
  Filled 2021-08-31: qty 90, 90d supply, fill #1

## 2021-06-12 ENCOUNTER — Other Ambulatory Visit: Payer: Self-pay

## 2021-06-12 MED ORDER — MONTELUKAST SODIUM 10 MG PO TABS
10.0000 mg | ORAL_TABLET | Freq: Every day | ORAL | 3 refills | Status: DC
  Filled 2021-06-12: qty 90, 90d supply, fill #0
  Filled 2021-09-08: qty 90, 90d supply, fill #1
  Filled 2021-12-06: qty 90, 90d supply, fill #2
  Filled 2022-03-07: qty 90, 90d supply, fill #3

## 2021-06-13 ENCOUNTER — Other Ambulatory Visit: Payer: Self-pay

## 2021-06-29 ENCOUNTER — Ambulatory Visit
Admission: RE | Admit: 2021-06-29 | Discharge: 2021-06-29 | Disposition: A | Discharge status: Home / Self Care | Payer: Medicare PPO | Source: Ambulatory Visit | Attending: Family Medicine | Admitting: Family Medicine

## 2021-06-29 ENCOUNTER — Other Ambulatory Visit: Payer: Self-pay

## 2021-06-29 DIAGNOSIS — Z1231 Encounter for screening mammogram for malignant neoplasm of breast: Secondary | ICD-10-CM | POA: Diagnosis present

## 2021-07-04 ENCOUNTER — Other Ambulatory Visit: Payer: Self-pay

## 2021-07-10 ENCOUNTER — Other Ambulatory Visit: Payer: Self-pay

## 2021-07-10 MED ORDER — CELECOXIB 200 MG PO CAPS
ORAL_CAPSULE | ORAL | 1 refills | Status: DC
  Filled 2021-07-10: qty 180, 90d supply, fill #0

## 2021-07-12 ENCOUNTER — Other Ambulatory Visit: Payer: Self-pay

## 2021-07-21 DIAGNOSIS — D2111 Benign neoplasm of connective and other soft tissue of right upper limb, including shoulder: Secondary | ICD-10-CM | POA: Insufficient documentation

## 2021-07-25 ENCOUNTER — Other Ambulatory Visit: Payer: Self-pay | Admitting: Surgery

## 2021-07-31 ENCOUNTER — Inpatient Hospital Stay: Admission: RE | Admit: 2021-07-31 | Payer: Medicare PPO | Source: Ambulatory Visit

## 2021-08-01 ENCOUNTER — Other Ambulatory Visit: Payer: Self-pay

## 2021-08-01 ENCOUNTER — Other Ambulatory Visit
Admission: RE | Admit: 2021-08-01 | Discharge: 2021-08-01 | Disposition: A | Discharge status: Home / Self Care | Payer: Medicare PPO | Source: Ambulatory Visit | Attending: Surgery | Admitting: Surgery

## 2021-08-01 DIAGNOSIS — Z01810 Encounter for preprocedural cardiovascular examination: Secondary | ICD-10-CM | POA: Insufficient documentation

## 2021-08-01 NOTE — Patient Instructions (Signed)
Your procedure is scheduled on: Wednesday August 09, 2021. Report to Day Surgery inside [facility] 2nd floor. To find out your arrival time please call [phone] between 1PM - 3PM on Tuesday August 08, 2021.  Remember: Instructions that are not followed completely may result in serious medical risk,  up to and including death, or upon the discretion of your surgeon and anesthesiologist your  surgery may need to be rescheduled.     _X__ 1. Do not eat food after midnight the night before your procedure.                 No chewing gum or hard candies. You may drink clear liquids up to 2 hours                 before you are scheduled to arrive for your surgery- DO not drink clear                 liquids within 2 hours of the start of your surgery.                 Clear Liquids include:  water, apple juice without pulp, clear Gatorade, G2 or                  Gatorade Zero (avoid Red/Purple/Blue), Black Coffee or Tea (Do not add                 anything to coffee or tea).  __X__2.   Complete the "Ensure Clear Pre-surgery Clear Carbohydrate Drink" provided to you, 2 hours before arrival. **If you are diabetic you will be provided with an alternative drink, Gatorade Zero or G2.  __X__3.  On the morning of surgery brush your teeth with toothpaste and water, you                may rinse your mouth with mouthwash if you wish.  Do not swallow any toothpaste or mouthwash.     _X__ 4.  No Alcohol for 24 hours before or after surgery.   _X__ 5.  Do Not Smoke or use e-cigarettes For 24 Hours Prior to Your Surgery.                 Do not use any chewable tobacco products for at least 6 hours prior to                 Surgery.  _X__  6.  Do not use any recreational drugs (marijuana, cocaine, heroin, ecstasy, MDMA or other)                For at least one week prior to your surgery.  Combination of these drugs with anesthesia                May have life threatening  results.  __X__ 7.  Notify your doctor if there is any change in your medical condition      (cold, fever, infections).     Do not wear jewelry, make-up, hairpins, clips or nail polish. Do not wear lotions, powders, or perfumes. You may wear deodorant. Do not shave 48 hours prior to surgery. Men may shave face and neck. Do not bring valuables to the hospital.    [facility] is not responsible for any belongings or valuables.  Contacts, dentures or bridgework may not be worn into surgery. Leave your suitcase in the car. After surgery it may be brought to your room. For patients  admitted to the hospital, discharge time is determined by your treatment team.   Patients discharged the day of surgery will not be allowed to drive home.   Make arrangements for someone to be with you for the first 24 hours of your Same Day Discharge.    __X__ Take these medicines the morning of surgery with A SIP OF WATER:    1. famotidine (PEPCID) 40 MG  2.   3.   4.  5.  6.  ____ Fleet Enema (as directed)   __X__ Use CHG Soap (or wipes) as directed  ____ Use Benzoyl Peroxide Gel as instructed  ____ Use inhalers on the day of surgery  ____ Stop metformin 2 days prior to surgery    ____ Take 1/2 of usual insulin dose the night before surgery. No insulin the morning          of surgery.   ____ Call your PCP, cardiologist, or Pulmonologist if taking Coumadin/Plavix/aspirin and ask when to stop before your surgery.   __X__ One Week prior to surgery- Stop Anti-inflammatories such as Ibuprofen, Aleve, Advil, Motrin, meloxicam (MOBIC), diclofenac, etodolac, ketorolac, Toradol, Daypro, piroxicam, Goody's or BC powders. OK TO USE TYLENOL IF NEEDED   __X__ One week prior to surgery stop supplements until after surgery. Ascorbic Acid (VITAMIN C) glucosamine-chondroitin 500-400 and  vitamin E 400 UNIT   ____ Bring C-Pap to the hospital.    If you have any questions regarding your pre-procedure  instructions,  Please call Pre-admit Testing at [phone]

## 2021-08-01 NOTE — Progress Notes (Signed)
Upcoming finger surgery scheduled for 08/09/21. Today's EKG reviewed by Dr. Wynetta Emery (anesthesia); no need for further cardiac workup.

## 2021-08-08 MED ORDER — LACTATED RINGERS IV SOLN: INTRAVENOUS | Status: DC

## 2021-08-08 MED ORDER — CEFAZOLIN SODIUM-DEXTROSE 2-4 GM/100ML-% IV SOLN
2.0000 g | INTRAVENOUS | Status: AC
  Administered 2021-08-09: 2 g via INTRAVENOUS

## 2021-08-08 MED ORDER — ORAL CARE MOUTH RINSE: 15.0000 mL | Freq: Once | OROMUCOSAL | Status: AC

## 2021-08-08 MED ORDER — CHLORHEXIDINE GLUCONATE 0.12 % MT SOLN: 15.0000 mL | Freq: Once | OROMUCOSAL | Status: AC

## 2021-08-09 ENCOUNTER — Ambulatory Visit
Admission: RE | Admit: 2021-08-09 | Discharge: 2021-08-09 | Disposition: A | Discharge status: Home / Self Care | Payer: Medicare PPO | Attending: Surgery | Admitting: Surgery

## 2021-08-09 ENCOUNTER — Ambulatory Visit: Payer: Medicare PPO | Admitting: Anesthesiology

## 2021-08-09 ENCOUNTER — Other Ambulatory Visit: Payer: Self-pay

## 2021-08-09 ENCOUNTER — Encounter: Payer: Self-pay | Admitting: Surgery

## 2021-08-09 ENCOUNTER — Encounter
Admission: RE | Disposition: A | Discharge status: Home / Self Care | Payer: Self-pay | Source: Home / Self Care | Attending: Surgery

## 2021-08-09 DIAGNOSIS — M1710 Unilateral primary osteoarthritis, unspecified knee: Secondary | ICD-10-CM | POA: Insufficient documentation

## 2021-08-09 DIAGNOSIS — R2231 Localized swelling, mass and lump, right upper limb: Secondary | ICD-10-CM | POA: Diagnosis not present

## 2021-08-09 DIAGNOSIS — D481 Neoplasm of uncertain behavior of connective and other soft tissue: Secondary | ICD-10-CM | POA: Insufficient documentation

## 2021-08-09 HISTORY — PX: EXCISION MASS UPPER EXTREMETIES: SHX6704

## 2021-08-09 SURGERY — EXCISION MASS UPPER EXTREMITIES
Anesthesia: General | Laterality: Right

## 2021-08-09 MED ORDER — 0.9 % SODIUM CHLORIDE (POUR BTL) OPTIME
TOPICAL | Status: DC | PRN
  Administered 2021-08-09: 300 mL

## 2021-08-09 MED ORDER — DEXAMETHASONE SODIUM PHOSPHATE 10 MG/ML IJ SOLN
INTRAMUSCULAR | Status: DC | PRN
  Administered 2021-08-09: 10 mg via INTRAVENOUS

## 2021-08-09 MED ORDER — PROPOFOL 10 MG/ML IV BOLUS
INTRAVENOUS | Status: AC
  Filled 2021-08-09: qty 20

## 2021-08-09 MED ORDER — FENTANYL CITRATE (PF) 100 MCG/2ML IJ SOLN: 25.0000 ug | INTRAMUSCULAR | Status: DC | PRN

## 2021-08-09 MED ORDER — OXYCODONE HCL 5 MG PO TABS
5.0000 mg | ORAL_TABLET | ORAL | Status: DC | PRN
  Administered 2021-08-09: 5 mg via ORAL

## 2021-08-09 MED ORDER — EPHEDRINE 5 MG/ML INJ
INTRAVENOUS | Status: AC
  Filled 2021-08-09: qty 5

## 2021-08-09 MED ORDER — ACETAMINOPHEN 10 MG/ML IV SOLN
INTRAVENOUS | Status: AC
  Filled 2021-08-09: qty 100

## 2021-08-09 MED ORDER — METOCLOPRAMIDE HCL 10 MG PO TABS: 5.0000 mg | ORAL_TABLET | Freq: Three times a day (TID) | ORAL | Status: DC | PRN

## 2021-08-09 MED ORDER — HYDROCODONE-ACETAMINOPHEN 5-325 MG PO TABS
ORAL_TABLET | ORAL | 0 refills | Status: DC
  Filled 2021-08-09: qty 15, 3d supply, fill #0

## 2021-08-09 MED ORDER — DICYCLOMINE HCL 10 MG PO CAPS: 10.0000 mg | ORAL_CAPSULE | Freq: Two times a day (BID) | ORAL | Status: DC

## 2021-08-09 MED ORDER — ONDANSETRON HCL 4 MG/2ML IJ SOLN
INTRAMUSCULAR | Status: AC
  Filled 2021-08-09: qty 2

## 2021-08-09 MED ORDER — FENTANYL CITRATE (PF) 100 MCG/2ML IJ SOLN
INTRAMUSCULAR | Status: DC | PRN
  Administered 2021-08-09: 25 ug via INTRAVENOUS
  Administered 2021-08-09: 50 ug via INTRAVENOUS

## 2021-08-09 MED ORDER — ONDANSETRON HCL 4 MG/2ML IJ SOLN: 4.0000 mg | Freq: Once | INTRAMUSCULAR | Status: DC | PRN

## 2021-08-09 MED ORDER — ACETAMINOPHEN 10 MG/ML IV SOLN
INTRAVENOUS | Status: DC | PRN
  Administered 2021-08-09: 1000 mg via INTRAVENOUS

## 2021-08-09 MED ORDER — PROPOFOL 10 MG/ML IV BOLUS
INTRAVENOUS | Status: DC | PRN
  Administered 2021-08-09: 100 mg via INTRAVENOUS

## 2021-08-09 MED ORDER — BUPIVACAINE HCL (PF) 0.5 % IJ SOLN
INTRAMUSCULAR | Status: AC
  Filled 2021-08-09: qty 30

## 2021-08-09 MED ORDER — KETOROLAC TROMETHAMINE 30 MG/ML IJ SOLN
INTRAMUSCULAR | Status: AC
  Filled 2021-08-09: qty 1

## 2021-08-09 MED ORDER — CEFAZOLIN SODIUM-DEXTROSE 2-4 GM/100ML-% IV SOLN
INTRAVENOUS | Status: AC
  Filled 2021-08-09: qty 100

## 2021-08-09 MED ORDER — SODIUM CHLORIDE 0.9 % IV SOLN: INTRAVENOUS | Status: DC

## 2021-08-09 MED ORDER — METOCLOPRAMIDE HCL 5 MG/ML IJ SOLN: 5.0000 mg | Freq: Three times a day (TID) | INTRAMUSCULAR | Status: DC | PRN

## 2021-08-09 MED ORDER — MIDAZOLAM HCL 2 MG/2ML IJ SOLN
INTRAMUSCULAR | Status: DC | PRN
  Administered 2021-08-09 (×2): 1 mg via INTRAVENOUS

## 2021-08-09 MED ORDER — FENTANYL CITRATE (PF) 100 MCG/2ML IJ SOLN
INTRAMUSCULAR | Status: AC
  Filled 2021-08-09: qty 2

## 2021-08-09 MED ORDER — ONDANSETRON HCL 4 MG/2ML IJ SOLN: 4.0000 mg | Freq: Four times a day (QID) | INTRAMUSCULAR | Status: DC | PRN

## 2021-08-09 MED ORDER — FAMOTIDINE 40 MG PO TABS: 40.0000 mg | ORAL_TABLET | Freq: Every day | ORAL | Status: DC

## 2021-08-09 MED ORDER — KETOROLAC TROMETHAMINE 30 MG/ML IJ SOLN
INTRAMUSCULAR | Status: DC | PRN
  Administered 2021-08-09: 30 mg via INTRAVENOUS

## 2021-08-09 MED ORDER — MIDAZOLAM HCL 2 MG/2ML IJ SOLN
INTRAMUSCULAR | Status: AC
  Filled 2021-08-09: qty 2

## 2021-08-09 MED ORDER — ONDANSETRON HCL 4 MG/2ML IJ SOLN
INTRAMUSCULAR | Status: DC | PRN
  Administered 2021-08-09: 4 mg via INTRAVENOUS

## 2021-08-09 MED ORDER — CHLORHEXIDINE GLUCONATE 0.12 % MT SOLN
OROMUCOSAL | Status: AC
  Administered 2021-08-09: 15 mL via OROMUCOSAL
  Filled 2021-08-09: qty 15

## 2021-08-09 MED ORDER — ONDANSETRON 4 MG PO TBDP
4.0000 mg | ORAL_TABLET | Freq: Three times a day (TID) | ORAL | 1 refills | Status: DC | PRN
  Filled 2021-08-09: qty 20, 7d supply, fill #0

## 2021-08-09 MED ORDER — ONDANSETRON HCL 4 MG PO TABS: 4.0000 mg | ORAL_TABLET | Freq: Four times a day (QID) | ORAL | Status: DC | PRN

## 2021-08-09 MED ORDER — DEXAMETHASONE SODIUM PHOSPHATE 10 MG/ML IJ SOLN
INTRAMUSCULAR | Status: AC
  Filled 2021-08-09: qty 1

## 2021-08-09 MED ORDER — BUPIVACAINE HCL (PF) 0.5 % IJ SOLN
INTRAMUSCULAR | Status: DC | PRN
  Administered 2021-08-09: 10 mL

## 2021-08-09 MED ORDER — HYDROCODONE-ACETAMINOPHEN 5-325 MG PO TABS: 1.0000 | ORAL_TABLET | Freq: Four times a day (QID) | ORAL | 0 refills | Status: DC | PRN

## 2021-08-09 MED ORDER — ACETAMINOPHEN 10 MG/ML IV SOLN: 1000.0000 mg | Freq: Once | INTRAVENOUS | Status: DC | PRN

## 2021-08-09 MED ORDER — HYDROCODONE-ACETAMINOPHEN 5-325 MG PO TABS: 1.0000 | ORAL_TABLET | ORAL | Status: DC | PRN

## 2021-08-09 MED ORDER — LIDOCAINE HCL (CARDIAC) PF 100 MG/5ML IV SOSY
PREFILLED_SYRINGE | INTRAVENOUS | Status: DC | PRN
  Administered 2021-08-09: 50 mg via INTRAVENOUS

## 2021-08-09 MED ORDER — OXYCODONE HCL 5 MG PO TABS
ORAL_TABLET | ORAL | Status: AC
  Filled 2021-08-09: qty 1

## 2021-08-09 MED ORDER — EPHEDRINE SULFATE 50 MG/ML IJ SOLN
INTRAMUSCULAR | Status: DC | PRN
  Administered 2021-08-09 (×4): 5 mg via INTRAVENOUS
  Administered 2021-08-09: 10 mg via INTRAVENOUS

## 2021-08-09 MED ORDER — PHENYLEPHRINE HCL-NACL 20-0.9 MG/250ML-% IV SOLN
INTRAVENOUS | Status: AC
  Filled 2021-08-09: qty 250

## 2021-08-09 SURGICAL SUPPLY — 35 items
APL PRP STRL LF DISP 70% ISPRP (MISCELLANEOUS) ×2
BNDG CMPR STD VLCR NS LF 5.8X4 (GAUZE/BANDAGES/DRESSINGS) ×1
BNDG ELASTIC 4X5.8 VLCR NS LF (GAUZE/BANDAGES/DRESSINGS) ×2 IMPLANT
BNDG ESMARK 4X12 TAN STRL LF (GAUZE/BANDAGES/DRESSINGS) ×2 IMPLANT
CHLORAPREP W/TINT 26 (MISCELLANEOUS) ×3 IMPLANT
CORD BIP STRL DISP 12FT (MISCELLANEOUS) ×2 IMPLANT
DRAPE ORTHO SPLIT 77X108 STRL (DRAPES) ×2
DRAPE SURG 17X11 SM STRL (DRAPES) ×2 IMPLANT
DRAPE SURG ORHT 6 SPLT 77X108 (DRAPES) ×1 IMPLANT
DRSG TELFA 4X3 1S NADH ST (GAUZE/BANDAGES/DRESSINGS) ×1 IMPLANT
ELECT REM PT RETURN 9FT ADLT (ELECTROSURGICAL) ×2
ELECTRODE REM PT RTRN 9FT ADLT (ELECTROSURGICAL) ×1 IMPLANT
FORCEPS JEWEL BIP 4-3/4 STR (INSTRUMENTS) ×2 IMPLANT
GAUZE 4X4 16PLY ~~LOC~~+RFID DBL (SPONGE) ×2 IMPLANT
GAUZE SPONGE 4X4 12PLY STRL (GAUZE/BANDAGES/DRESSINGS) ×2 IMPLANT
GAUZE XEROFORM 1X8 LF (GAUZE/BANDAGES/DRESSINGS) ×1 IMPLANT
GLOVE SURG UNDER LTX SZ8 (GLOVE) ×2 IMPLANT
GOWN STRL REUS W/ TWL LRG LVL3 (GOWN DISPOSABLE) ×1 IMPLANT
GOWN STRL REUS W/ TWL XL LVL3 (GOWN DISPOSABLE) ×1 IMPLANT
GOWN STRL REUS W/TWL LRG LVL3 (GOWN DISPOSABLE) ×2
GOWN STRL REUS W/TWL XL LVL3 (GOWN DISPOSABLE) ×2
KIT TURNOVER KIT A (KITS) ×2 IMPLANT
MANIFOLD NEPTUNE II (INSTRUMENTS) ×2 IMPLANT
NS IRRIG 500ML POUR BTL (IV SOLUTION) ×2 IMPLANT
PACK EXTREMITY ARMC (MISCELLANEOUS) ×2 IMPLANT
PAD CAST CTTN 4X4 STRL (SOFTGOODS) ×1 IMPLANT
PADDING CAST COTTON 4X4 STRL (SOFTGOODS) ×2
STRIP CLOSURE SKIN 1/2X4 (GAUZE/BANDAGES/DRESSINGS) ×1 IMPLANT
SUT PROLENE 4 0 PS 2 18 (SUTURE) ×2 IMPLANT
SUT VIC AB 2-0 SH 27 (SUTURE) ×2
SUT VIC AB 2-0 SH 27XBRD (SUTURE) ×1 IMPLANT
SUT VIC AB 3-0 PS2 18 (SUTURE) ×2 IMPLANT
SUT VIC AB 4-0 RB1 27 (SUTURE) ×2
SUT VIC AB 4-0 RB1 27X BRD (SUTURE) IMPLANT
WATER STERILE IRR 500ML POUR (IV SOLUTION) ×2 IMPLANT

## 2021-08-09 NOTE — Anesthesia Postprocedure Evaluation (Signed)
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   Last Vitals:  Vitals:   08/09/21 1200 08/09/21 1203  BP: 136/84 136/84  Pulse: 84 (!) 117  Resp: 16 (!) 26  Temp:    SpO2: 100% 90%    Last Pain:  Vitals:   08/09/21 1203  TempSrc:   PainSc: 0-No pain                 Arita Miss

## 2021-08-09 NOTE — Anesthesia Preprocedure Evaluation (Signed)
Anesthesia Evaluation  Patient identified by MRN, date of birth, ID band Patient awake    Reviewed: Allergy & Precautions, H&P , NPO status , Patient's Chart, lab work & pertinent test results  History of Anesthesia Complications (+) PONV and history of anesthetic complications (PONV with morphine)  Airway Mallampati: III  TM Distance: <3 FB Neck ROM: limited    Dental  (+) Chipped   Pulmonary neg pulmonary ROS, neg sleep apnea, neg COPD, Patient abstained from smoking.Not current smoker,    Pulmonary exam normal breath sounds clear to auscultation       Cardiovascular Exercise Tolerance: Good METS(-) hypertension(-) angina(-) CAD and (-) Past MI negative cardio ROS Normal cardiovascular exam+ dysrhythmias (Pt denies)  Rhythm:Regular Rate:Normal - Systolic murmurs    Neuro/Psych negative neurological ROS  negative psych ROS   GI/Hepatic Neg liver ROS, GERD  Controlled and Medicated,  Endo/Other  negative endocrine ROSneg diabetes  Renal/GU negative Renal ROS  negative genitourinary   Musculoskeletal  (+) Arthritis ,   Abdominal   Peds  Hematology  (+) anemia ,   Anesthesia Other Findings Past Medical History: No date: Dysrhythmia No date: GERD (gastroesophageal reflux disease) No date: Hyperlipidemia  Past Surgical History: No date: ABDOMINAL HYSTERECTOMY No date: APPENDECTOMY 1996: BREAST BIOPSY; Left     Comment:  neg 12/10/2014: COLONOSCOPY; N/A     Comment:  Procedure: COLONOSCOPY;  Surgeon: Lollie Sails, MD;              Location: [facility] ENDOSCOPY;  Service: Endoscopy;                Laterality: N/A; No date: ORIF ANKLE FRACTURE  BMI    Body Mass Index: 23.86 kg/m      Reproductive/Obstetrics negative OB ROS                             Anesthesia Physical  Anesthesia Plan  ASA: 2  Anesthesia Plan: General   Post-op Pain Management: Ofirmev IV (intra-op)    Induction: Intravenous  PONV Risk Score and Plan: 4 or greater and Ondansetron, Dexamethasone and Treatment may vary due to age or medical condition  Airway Management Planned: LMA  Additional Equipment: None  Intra-op Plan:   Post-operative Plan: Extubation in OR  Informed Consent: I have reviewed the patients History and Physical, chart, labs and discussed the procedure including the risks, benefits and alternatives for the proposed anesthesia with the patient or authorized representative who has indicated his/her understanding and acceptance.     Dental Advisory Given  Plan Discussed with: Anesthesiologist, CRNA and Surgeon  Anesthesia Plan Comments: (Patient consented for risks of anesthesia including but not limited to:  - adverse reactions to medications - risk of airway placement if required - damage to eyes, teeth, lips or other oral mucosa - nerve damage due to positioning  - sore throat or hoarseness - Damage to heart, brain, nerves, lungs, other parts of body or loss of life  Patient voiced understanding.)        Anesthesia Quick Evaluation

## 2021-08-09 NOTE — Transfer of Care (Signed)
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  Last Pain:  Vitals:   08/09/21 0849  TempSrc: Temporal         Complications: No notable events documented.

## 2021-08-09 NOTE — H&P (Signed)
History of Present Illness:  Patricia Oliver is a 71 y.o. female who presents for evaluation and treatment of a soft tissue mass on her right index finger. The patient notes that mass has been present for about a year. She denies any specific injury to the finger, but notes that she is right-handed and was using her finger quite frequently to text fellow volunteers in the [facility] system, and wonders if this may have contributed to the onset of her symptoms. She mentioned this to Reche Dixon, PA-C, whom she sees for knee arthritis, and he suggested that she see me for further evaluation and treatment. The patient notes only mild discomfort in the index finger which she rates a 2/10 on today's visit. She feels some "stiffness" of the index PIP joint but denies any numbness or paresthesias to the fingertip. She does not feel that it has been any recent change in size of the mass but does note that it occasionally will turn "turn blue".  Current Outpatient Medications:  ascorbic acid, vitamin C, (VITAMIN C) 500 MG tablet Take 500 mg by mouth once daily.   azelastine (ASTELIN) 137 mcg nasal spray PLACE 1 SPRAY INTO BOTH NOSTRILS ONCE DAILY 90 mL 3   azelastine (OPTIVAR) 0.05 % ophthalmic solution Place 1 drop into both eyes once daily 6 mL 5   calcium citrate-vitamin D3 (CITRACAL+D) 315 mg-6.25 mcg (250 unit) tablet Take 1 tablet by mouth 2 (two) times daily with meals.   celecoxib (CELEBREX) 200 MG capsule Take 1 capsule (200 mg total) by mouth 2 (two) times daily 180 capsule 1   cyanocobalamin (VITAMIN B12) 1000 MCG tablet Take 1,000 mcg by mouth once daily   dicyclomine (BENTYL) 10 mg capsule Take 1 capsule (10 mg total) by mouth 4 (four) times daily before meals and nightly 360 capsule 0   famotidine (PEPCID) 40 MG tablet Take 1 tablet (40 mg total) by mouth 2 (two) times daily as needed for Heartburn 180 tablet 1   fluticasone propionate (FLONASE) 50 mcg/actuation nasal spray PLACE 2 SPRAYS INTO BOTH NOSTRILS  ONCE DAILY. 48 g 1   glucosamine sulfate 500 mg Tab Take 500 mg by mouth once daily.   guaiFENesin (MUCINEX) 600 mg SR tablet Take 600 mg by mouth every 12 (twelve) hours as needed for Cough.   montelukast (SINGULAIR) 10 mg tablet TAKE 1 TABLET BY MOUTH ONCE DAILY 90 tablet 3   multivitamin tablet Take 1 tablet by mouth once daily   simvastatin (ZOCOR) 40 MG tablet Take 1 tablet (40 mg total) by mouth once daily 90 tablet 3   tiZANidine (ZANAFLEX) 2 MG tablet Take 1 tablet (2 mg total) by mouth nightly as needed 30 tablet 1   vitamin E 400 UNIT capsule Take 400 Units by mouth once daily.   Allergies:   Ciprofloxacin Swelling   Morphine Vomiting   Sulfa (Sulfonamide Antibiotics) Rash   Past Medical History:   Acid reflux disease   Arthritis   Cataract cortical, senile (left eye)   Cataract, left eye   Chickenpox   Chickenpox   Diverticulosis 12/10/2014   Fibrocystic breast disease (With chronic right breast nodule)   History of palpitations   History of palpitations   Hyperlipidemia   Hyperplastic colon polyp 12/10/2014   Internal hemorrhoids 12/10/2014   Osteopenia   Perennial allergic rhinitis   Stress fracture of foot (Right)   Vaginal dryness   Past Surgical History:   APPENDECTOMY 1956   HYSTERECTOMY 2001 (TAH/BSO)  FRACTURE SURGERY Right 2006 (ORIF right foot fracture)   COLONOSCOPY 09/01/2004 (repeat 10 years)   COLONOSCOPY 12/10/2014 (Hyperplastic colon polyp/Repeat 88yr/MUS)   Family History:   Alzheimer's disease Mother   Multiple myeloma Father   Breast cancer Sister   Kidney disease Brother   Breast cancer Sister   Social History:   Socioeconomic History:   Marital status: Married  Tobacco Use   Smoking status: Never   Smokeless tobacco: Never  Substance and Sexual Activity   Alcohol use: No  Alcohol/week: 0.0 standard drinks   Drug use: No   Sexual activity: Yes  Partners: Male  Birth control/protection: Surgical   Review of Systems:  A  comprehensive 14 point ROS was performed, reviewed, and the pertinent orthopaedic findings are documented in the HPI.  Physical Exam: Vitals:  07/21/21 1212  BP: 126/84  Weight: 65.9 kg (145 lb 3.2 oz)  Height: 160 cm (5' 3" )  PainSc: 2  PainLoc: Finger   General/Constitutional: The patient appears to be well-nourished, well-developed, and in no acute distress. Neuro/Psych: Normal mood and affect, oriented to person, place and time. Eyes: Non-icteric. Pupils are equal, round, and reactive to light, and exhibit synchronous movement. ENT: Unremarkable. Lymphatic: No palpable adenopathy. Respiratory: Lungs clear to auscultation, Normal chest excursion, No wheezes and Non-labored breathing Cardiovascular: Regular rate and rhythm. No murmurs. and No edema, swelling or tenderness, except as noted in detailed exam. Integumentary: No impressive skin lesions present, except as noted in detailed exam. Musculoskeletal: Unremarkable, except as noted in detailed exam.  Right hand exam: Skin inspection of the right hand is notable for fullness around the PIP joint region of the right index finger. A minimally tender apparently fluid-filled soft tissue mass is palpable at the aspect of the index PIP joint extending from the midportion of the proximal phalanx just distal to the PIP joint. The mass appears to be somewhat multi lobulated. There also appears to be some fullness consistent with a soft tissue mass over the radial aspect of the PIP joint, as well as over the dorso-radial aspect of the PIP joint. She is able to flex the PIP joint from 0 to 100 degrees without discomfort. She is neurovascularly intact to all digits.  X-rays/MRI/Lab data:  AP, lateral, and oblique views of the right hand are obtained. These films demonstrate mild degenerative changes of the index PIP joint, as well as more moderate degenerative changes of the index, long, ring, and little DIP joints. There is no evidence for bony  erosions, fractures, or any other acute or chronic bony changes.  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   Plan: The treatment options were discussed with the patient. In addition, patient educational materials were provided regarding the diagnosis and treatment options. The patient is quite frustrated by her symptoms and functional limitations, I would like to consider more aggressive treatment options. Therefore, I have recommended a surgical procedure, specifically an excision of the soft tissue mass around the PIP joint of the right index finger. The procedure was discussed with the patient, as were the potential risks (including bleeding, infection, nerve and/or blood vessel injury, persistent or recurrent pain, recurrence of the mass, stiffness of the finger, need for further surgery, blood clots, strokes, heart attacks and/or arhythmias, pneumonia, etc.) and benefits. The patient states her understanding and wishes to proceed. All of the patient's questions and concerns were answered. She can call any time with further concerns. She will follow up post-surgery, routine.  H&P reviewed and patient re-examined. No changes.

## 2021-08-09 NOTE — Op Note (Signed)
08/09/2021  11:46 AM  Patient:   Patricia Oliver  Pre-Op Diagnosis:   Soft tissue mass right index PIP joint.  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  Surgeon:   Pascal Lux, MD  Assistant:   Cyd Silence, PA-S  Anesthesia:   General LMA  Findings:   As above.  Complications:   None  Fluids:   600 cc crystalloid  EBL:   0 cc  UOP:   None  TT:   54 minutes at 250 mmHg  Drains:   None  Closure:   4-0 Prolene interrupted sutures  Brief Clinical Note:   The patient is a 71 year old female with a history of a gradually enlarging soft tissue mass involving the dorsal and dorsal ulnar aspect of the right index PIP joint region.  Her symptoms have progressed despite medications, activity modification, etc.  Her history and examination consistent with a benign neoplasm of the right index PIP joint.  She presents at this time for excision of this soft tissue mass.  Procedure:   The patient was brought into the operating room and lain in the supine position.  After adequate general laryngeal mask anesthesia was obtained, the patient's right hand and upper extremity were prepped with ChloraPrep solution before being draped sterilely.  Preoperative antibiotics were administered.  A timeout was performed to verify the appropriate surgical site before the limb was exsanguinated with an Esmarch and the tourniquet inflated to 250 mmHg.  A longitudinal incision was made along the ulnar aspect of the right index finger centered over the PIP joint.  The incision was carried down through the subcutaneous tissues to expose the mass.  The mass was solid, multilobulated, and yellowish/brownish in color, consistent with a giant cell tumor of tendon sheath.  The mass was carefully dissected out from proximal to distal and from volar to dorsal with care  taken to identify and protect the important neurovascular and ligamentous/tendinous structures.  The mass was noted to extend deep beneath the central slip of the extensor mechanism and appeared to actually infiltrate portions of this tendon.  The mass extended to the radial aspect of the finger deep to the extensor mechanism.    In order to access the more radial portion of the mass, a longitudinal incision was made between the oblique ligament and the central slip.  The mass was removed in its entirety and several pieces.  Small areas of remaining tumor were cauterized using bipolar electrocautery.  In addition, bipolar electrocautery was used to help with hemostasis throughout the procedure.  The wound was copiously irrigated with sterile saline solution using bulb irrigation before the defect between the central slip and radial oblique ligament was repaired using 4-0 Vicryl interrupted sutures.  The skin was closed using 4-0 Prolene interrupted sutures before a total of 10 cc of 25% plain Sensorcaine was injected in and around the incision to help with postoperative analgesia.  A sterile bulky dressing was applied to the wound, incorporating a dorsal AlumaFoam splint to keep the PIP joint of the right index finger in extension.  The patient was then awakened, extubated, and returned to the recovery room in satisfactory condition after tolerating the procedure well.

## 2021-08-09 NOTE — Anesthesia Procedure Notes (Signed)
Procedure Name: LMA Insertion Date/Time: 08/09/2021 10:18 AM Performed by: Doreen Salvage, CRNA Pre-anesthesia Checklist: Patient identified, Patient being monitored, Timeout performed, Emergency Drugs available and Suction available Patient Re-evaluated:Patient Re-evaluated prior to induction Oxygen Delivery Method: Circle system utilized Preoxygenation: Pre-oxygenation with 100% oxygen Induction Type: IV induction Ventilation: Mask ventilation without difficulty LMA: LMA inserted LMA Size: 4.0 Tube type: Oral Number of attempts: 1 Placement Confirmation: positive ETCO2 and breath sounds checked- equal and bilateral Tube secured with: Tape Dental Injury: Teeth and Oropharynx as per pre-operative assessment

## 2021-08-09 NOTE — Discharge Instructions (Signed)

## 2021-08-10 ENCOUNTER — Encounter: Payer: Self-pay | Admitting: Surgery

## 2021-08-10 LAB — SURGICAL PATHOLOGY

## 2021-08-11 ENCOUNTER — Other Ambulatory Visit: Payer: Self-pay

## 2021-08-11 ENCOUNTER — Telehealth: Payer: Medicare PPO | Admitting: Physician Assistant

## 2021-08-11 DIAGNOSIS — J020 Streptococcal pharyngitis: Secondary | ICD-10-CM

## 2021-08-11 MED ORDER — AMOXICILLIN 500 MG PO CAPS
500.0000 mg | ORAL_CAPSULE | Freq: Two times a day (BID) | ORAL | 0 refills | Status: AC
  Filled 2021-08-11: qty 20, 10d supply, fill #0

## 2021-08-11 MED ORDER — LIDOCAINE VISCOUS HCL 2 % MT SOLN
OROMUCOSAL | 0 refills | Status: DC
  Filled 2021-08-11: qty 100, 3d supply, fill #0

## 2021-08-11 NOTE — Progress Notes (Signed)
Virtual Visit Consent   Patricia Oliver, you are scheduled for a virtual visit with a [facility] provider today.     Just as with appointments in the office, your consent must be obtained to participate.  Your consent will be active for this visit and any virtual visit you may have with one of our providers in the next 365 days.     If you have a MyChart account, a copy of this consent can be sent to you electronically.  All virtual visits are billed to your insurance company just like a traditional visit in the office.    As this is a virtual visit, video technology does not allow for your provider to perform a traditional examination.  This may limit your provider's ability to fully assess your condition.  If your provider identifies any concerns that need to be evaluated in person or the need to arrange testing (such as labs, EKG, etc.), we will make arrangements to do so.     Although advances in technology are sophisticated, we cannot ensure that it will always work on either your end or our end.  If the connection with a video visit is poor, the visit may have to be switched to a telephone visit.  With either a video or telephone visit, we are not always able to ensure that we have a secure connection.     I need to obtain your verbal consent now.   Are you willing to proceed with your visit today?    [name] has provided verbal consent on 08/11/2021 for a virtual visit (video or telephone).   Margaretann Loveless, PA-C   Date: 08/11/2021 8:22 AM   Virtual Visit via Video Note   I, Margaretann Loveless, connected with  Patricia Oliver  ([id number], 29-Dec-1950) on 08/11/21 at  8:15 AM EST by a video-enabled telemedicine application and verified that I am speaking with the correct person using two identifiers.  Location: Patient: Virtual Visit Location Patient: Home Provider: Virtual Visit Location Provider: Home Office   I discussed the limitations of evaluation and management by  telemedicine and the availability of in person appointments. The patient expressed understanding and agreed to proceed.    History of Present Illness: Patricia Oliver is a 71 y.o. who identifies as a female who was assigned female at birth, and is being seen today for sore throat.  HPI: Sore Throat  This is a new problem. The current episode started in the past 7 days (2 days ago). The problem has been gradually worsening. There has been no fever. Associated symptoms include congestion, coughing and swollen glands (left). Pertinent negatives include no ear discharge, ear pain, headaches, hoarse voice, plugged ear sensation, shortness of breath or trouble swallowing. She has had no exposure to strep or mono. She has tried acetaminophen (Mucinex) for the symptoms. The treatment provided no relief.     Problems:  Patient Active Problem List   Diagnosis Date Noted   Change in bowel habits 10/26/2020   Primary osteoarthritis of left knee 04/30/2018   Allergic rhinitis 11/19/2013   Esophageal reflux 11/19/2013   Other and unspecified hyperlipidemia 11/19/2013    Allergies:  Allergies  Allergen Reactions   Morphine And Related Nausea And Vomiting   Ciprofloxacin Swelling and Rash   Elemental Sulfur Itching, Swelling and Rash   Medications:  Current Outpatient Medications:    amoxicillin (AMOXIL) 500 MG capsule, Take 1 capsule (500 mg total) by mouth 2 (  two) times daily for 10 days., Disp: 20 capsule, Rfl: 0   lidocaine (XYLOCAINE) 2 % solution, 5 mL swish and swallow every 4 hours as needed for sore throat, Disp: 100 mL, Rfl: 0   Ascorbic Acid (VITAMIN C) 100 MG tablet, Take 400 mg by mouth daily., Disp: , Rfl:    azelastine (ASTELIN) 0.1 % nasal spray, PLACE 1 SPRAY INTO BOTH NOSTRILS ONCE DAILY, Disp: 90 mL, Rfl: 3   azelastine (OPTIVAR) 0.05 % ophthalmic solution, Place 1 drop into both eyes once daily, Disp: 6 mL, Rfl: 5   Calcium Citrate-Vitamin D (CITRACAL + D PO), Take by mouth., Disp:  , Rfl:    celecoxib (CELEBREX) 200 MG capsule, Take by mouth., Disp: , Rfl:    dicyclomine (BENTYL) 10 MG capsule, Take 1 capsule (10 mg total) by mouth in the morning and at bedtime., Disp: , Rfl:    famotidine (PEPCID) 40 MG tablet, Take 1 tablet (40 mg total) by mouth daily., Disp: , Rfl:    fluticasone (FLONASE) 50 MCG/ACT nasal spray, PLACE 2 SPRAYS INTO BOTH NOSTRILS ONCE DAILY., Disp: 48 g, Rfl: 1   glucosamine-chondroitin 500-400 MG tablet, Take 1 tablet by mouth daily., Disp: , Rfl:    guaifenesin (HUMIBID E) 400 MG TABS tablet, Take 400 mg by mouth daily., Disp: , Rfl:    HYDROcodone-acetaminophen (NORCO) 5-325 MG tablet, Take 1-2 tablets by mouth every 6 (six) hours as needed for moderate pain. MAXIMUM TOTAL ACETAMINOPHEN DOSE IS 4000 MG PER DAY, Disp: 15 tablet, Rfl: 0   HYDROcodone-acetaminophen (NORCO/VICODIN) 5-325 MG tablet, Take 1 to 2 tablets by mouth every 6 hours as needed for moderate pain., Disp: 15 tablet, Rfl: 0   montelukast (SINGULAIR) 10 MG tablet, TAKE 1 TABLET BY MOUTH ONCE DAILY, Disp: 90 tablet, Rfl: 3   ondansetron (ZOFRAN-ODT) 4 MG disintegrating tablet, Dissolve 1 tablet (4 mg total) by mouth every 8 (eight) hours as needed for nausea or vomiting., Disp: 20 tablet, Rfl: 1   simvastatin (ZOCOR) 40 MG tablet, Take 1 tablet (40 mg total) by mouth once daily (Patient taking differently: Take 20 mg by mouth daily.), Disp: 90 tablet, Rfl: 3   vitamin E 400 UNIT capsule, Take 400 Units by mouth daily., Disp: , Rfl:    zolpidem (AMBIEN) 5 MG tablet, TAKE 1 TABLET BY MOUTH NIGHTLY AS NEEDED FOR SLEEP, Disp: 30 tablet, Rfl: 0  Observations/Objective: Patient is well-developed, well-nourished in no acute distress.  Resting comfortably at home.  Head is normocephalic, atraumatic.  No labored breathing. Speech is clear and coherent with logical content.  Patient is alert and oriented at baseline.    Assessment and Plan: 1. Strep throat - amoxicillin (AMOXIL) 500 MG  capsule; Take 1 capsule (500 mg total) by mouth 2 (two) times daily for 10 days.  Dispense: 20 capsule; Refill: 0 - lidocaine (XYLOCAINE) 2 % solution; 5 mL swish and swallow every 4 hours as needed for sore throat  Dispense: 100 mL; Refill: 0  - Suspect strep - Amoxicillin and lidocaine prescribed - Tylenol and ibuprofen as needed  - Salt water gargles and warm liquids - Seek in person evaluation if not improving or worsening  Follow Up Instructions: I discussed the assessment and treatment plan with the patient. The patient was provided an opportunity to ask questions and all were answered. The patient agreed with the plan and demonstrated an understanding of the instructions.  A copy of instructions were sent to the patient via MyChart unless otherwise  noted below.    The patient was advised to call back or seek an in-person evaluation if the symptoms worsen or if the condition fails to improve as anticipated.  Time:  I spent 9 minutes with the patient via telehealth technology discussing the above problems/concerns.    Margaretann Loveless, PA-C

## 2021-08-11 NOTE — Patient Instructions (Signed)
Patricia Oliver, thank you for joining [name], PA-C for today's virtual visit.  While this provider is not your primary care provider (PCP), if your PCP is located in our provider database this encounter information will be shared with them immediately following your visit.  Consent: (Patient) Patricia Oliver provided verbal consent for this virtual visit at the beginning of the encounter.  Current Medications:  Current Outpatient Medications:    amoxicillin (AMOXIL) 500 MG capsule, Take 1 capsule (500 mg total) by mouth 2 (two) times daily for 10 days., Disp: 20 capsule, Rfl: 0   lidocaine (XYLOCAINE) 2 % solution, 5 mL swish and swallow every 4 hours as needed for sore throat, Disp: 100 mL, Rfl: 0   Ascorbic Acid (VITAMIN C) 100 MG tablet, Take 400 mg by mouth daily., Disp: , Rfl:    azelastine (ASTELIN) 0.1 % nasal spray, PLACE 1 SPRAY INTO BOTH NOSTRILS ONCE DAILY, Disp: 90 mL, Rfl: 3   azelastine (OPTIVAR) 0.05 % ophthalmic solution, Place 1 drop into both eyes once daily, Disp: 6 mL, Rfl: 5   Calcium Citrate-Vitamin D (CITRACAL + D PO), Take by mouth., Disp: , Rfl:    celecoxib (CELEBREX) 200 MG capsule, Take by mouth., Disp: , Rfl:    dicyclomine (BENTYL) 10 MG capsule, Take 1 capsule (10 mg total) by mouth in the morning and at bedtime., Disp: , Rfl:    famotidine (PEPCID) 40 MG tablet, Take 1 tablet (40 mg total) by mouth daily., Disp: , Rfl:    fluticasone (FLONASE) 50 MCG/ACT nasal spray, PLACE 2 SPRAYS INTO BOTH NOSTRILS ONCE DAILY., Disp: 48 g, Rfl: 1   glucosamine-chondroitin 500-400 MG tablet, Take 1 tablet by mouth daily., Disp: , Rfl:    guaifenesin (HUMIBID E) 400 MG TABS tablet, Take 400 mg by mouth daily., Disp: , Rfl:    HYDROcodone-acetaminophen (NORCO) 5-325 MG tablet, Take 1-2 tablets by mouth every 6 (six) hours as needed for moderate pain. MAXIMUM TOTAL ACETAMINOPHEN DOSE IS 4000 MG PER DAY, Disp: 15 tablet, Rfl: 0   HYDROcodone-acetaminophen (NORCO/VICODIN)  5-325 MG tablet, Take 1 to 2 tablets by mouth every 6 hours as needed for moderate pain., Disp: 15 tablet, Rfl: 0   montelukast (SINGULAIR) 10 MG tablet, TAKE 1 TABLET BY MOUTH ONCE DAILY, Disp: 90 tablet, Rfl: 3   ondansetron (ZOFRAN-ODT) 4 MG disintegrating tablet, Dissolve 1 tablet (4 mg total) by mouth every 8 (eight) hours as needed for nausea or vomiting., Disp: 20 tablet, Rfl: 1   simvastatin (ZOCOR) 40 MG tablet, Take 1 tablet (40 mg total) by mouth once daily (Patient taking differently: Take 20 mg by mouth daily.), Disp: 90 tablet, Rfl: 3   vitamin E 400 UNIT capsule, Take 400 Units by mouth daily., Disp: , Rfl:    zolpidem (AMBIEN) 5 MG tablet, TAKE 1 TABLET BY MOUTH NIGHTLY AS NEEDED FOR SLEEP, Disp: 30 tablet, Rfl: 0   Medications ordered in this encounter:  Meds ordered this encounter  Medications   amoxicillin (AMOXIL) 500 MG capsule    Sig: Take 1 capsule (500 mg total) by mouth 2 (two) times daily for 10 days.    Dispense:  20 capsule    Refill:  0    Order Specific Question:   Supervising Provider    Answer:   MILLER, BRIAN [[id number]]   lidocaine (XYLOCAINE) 2 % solution    Sig: 5 mL swish and swallow every 4 hours as needed for sore throat  Dispense:  100 mL    Refill:  0    Order Specific Question:   Supervising Provider    Answer:   Sabra Heck, BRIAN [[id number]]     *If you need refills on other medications prior to your next appointment, please contact your pharmacy*  Follow-Up: Call back or seek an in-person evaluation if the symptoms worsen or if the condition fails to improve as anticipated.  Other Instructions Strep Throat, Adult Strep throat is an infection in the throat that is caused by bacteria. It is common during the cold months of the year. It mostly affects children who are 73-81 years old. However, people of all ages can get it at any time of the year. This infection spreads from person to person (is contagious) through coughing, sneezing, or having close  contact. Your health care provider may use other names to describe the infection. When strep throat affects the tonsils, it is called tonsillitis. When it affects the back of the throat, it is called pharyngitis. What are the causes? This condition is caused by the Streptococcus pyogenes bacteria. What increases the risk? You are more likely to develop this condition if: You care for school-age children, or are around school-age children. Children are more likely to get strep throat and may spread it to others. You spend time in crowded places where the infection can spread easily. You have close contact with someone who has strep throat. What are the signs or symptoms? Symptoms of this condition include: Fever or chills. Redness, swelling, or pain in the tonsils or throat. Pain or difficulty when swallowing. White or yellow spots on the tonsils or throat. Tender glands in the neck and under the jaw. Bad smelling breath. Red rash all over the body. This is rare. How is this diagnosed? This condition is diagnosed by tests that check for the presence and the amount of bacteria that cause strep throat. They are: Rapid strep test. Your throat is swabbed and checked for the presence of bacteria. Results are usually ready in minutes. Throat culture test. Your throat is swabbed. The sample is placed in a cup that allows infections to grow. Results are usually ready in 1 or 2 days. How is this treated? This condition may be treated with: Medicines that kill germs (antibiotics). Medicines that relieve pain or fever. These include: Ibuprofen or acetaminophen. Aspirin, only for people who are over the age of 59. Throat lozenges. Throat sprays. Follow these instructions at home: Medicines  Take over-the-counter and prescription medicines only as told by your health care provider. Take your antibiotic medicine as told by your health care provider. Do not stop taking the antibiotic even if you  start to feel better. Eating and drinking  If you have trouble swallowing, try eating soft foods until your sore throat feels better. Drink enough fluid to keep your urine pale yellow. To help relieve pain, you may have: Warm fluids, such as soup and tea. Cold fluids, such as frozen desserts or popsicles. General instructions Gargle with a salt-water mixture 3-4 times a day or as needed. To make a salt-water mixture, completely dissolve -1 tsp (3-6 g) of salt in 1 cup (237 mL) of warm water. Get plenty of rest. Stay home from work or school until you have been taking antibiotics for 24 hours. Do not use any products that contain nicotine or tobacco. These products include cigarettes, chewing tobacco, and vaping devices, such as e-cigarettes. If you need help quitting, ask your health care provider.  It is up to you to get your test results. Ask your health care provider, or the department that is doing the test, when your results will be ready. Keep all follow-up visits. This is important. How is this prevented?  Do not share food, drinking cups, or personal items that could cause the infection to spread to other people. Wash your hands often with soap and water for at least 20 seconds. If soap and water are not available, use hand sanitizer. Make sure that all people in your house wash their hands well. Have family members tested if they have a sore throat or fever. They may need an antibiotic if they have strep throat. Contact a health care provider if: You have swelling in your neck that keeps getting bigger. You develop a rash, cough, or earache. You cough up a thick mucus that is green, yellow-brown, or bloody. You have pain or discomfort that does not get better with medicine. Your symptoms seem to be getting worse. You have a fever. Get help right away if: You have new symptoms, such as vomiting, severe headache, stiff or painful neck, chest pain, or shortness of breath. You have  severe throat pain, drooling, or changes in your voice. You have swelling of the neck, or the skin on the neck becomes red and tender. You have signs of dehydration, such as tiredness (fatigue), dry mouth, and decreased urination. You become increasingly sleepy, or you cannot wake up completely. Your joints become red or painful. These symptoms may represent a serious problem that is an emergency. Do not wait to see if the symptoms will go away. Get medical help right away. Call your local emergency services (911 in the [place] Do not drive yourself to the hospital. Summary Strep throat is an infection in the throat that is caused by the Streptococcus pyogenes bacteria. This infection is spread from person to person (is contagious) through coughing, sneezing, or having close contact. Take your medicines, including antibiotics, as told by your health care provider. Do not stop taking the antibiotic even if you start to feel better. To prevent the spread of germs, wash your hands well with soap and water. Have others do the same. Do not share food, drinking cups, or personal items. Get help right away if you have new symptoms, such as vomiting, severe headache, stiff or painful neck, chest pain, or shortness of breath. This information is not intended to replace advice given to you by your health care provider. Make sure you discuss any questions you have with your health care provider. Document Revised: 11/01/2020 Document Reviewed: 11/01/2020 Elsevier Patient Education  2022 [employer].    If you have been instructed to have an in-person evaluation today at a local Urgent Care facility, please use the link below. It will take you to a list of all of our available [facility], including address, phone number and hours of operation. Please do not delay care.  [facility]  If you or a family member do not have a primary care provider, use the link below to schedule a  visit and establish care. When you choose a [facility] primary care physician or advanced practice provider, you gain a long-term partner in health. Find a Primary Care Provider  Learn more about [facility]'s in-office and virtual care options: [facility]

## 2021-08-21 ENCOUNTER — Other Ambulatory Visit: Payer: Self-pay

## 2021-08-21 MED ORDER — SIMVASTATIN 40 MG PO TABS
ORAL_TABLET | ORAL | 3 refills | Status: DC
  Filled 2021-08-21: qty 90, 90d supply, fill #0
  Filled 2022-06-10: qty 90, 90d supply, fill #1

## 2021-08-29 ENCOUNTER — Other Ambulatory Visit: Payer: Self-pay

## 2021-08-29 ENCOUNTER — Encounter: Payer: Self-pay | Admitting: Occupational Therapy

## 2021-08-29 ENCOUNTER — Ambulatory Visit: Payer: Medicare PPO | Attending: Student | Admitting: Occupational Therapy

## 2021-08-29 DIAGNOSIS — M6281 Muscle weakness (generalized): Secondary | ICD-10-CM | POA: Diagnosis present

## 2021-08-29 DIAGNOSIS — M25641 Stiffness of right hand, not elsewhere classified: Secondary | ICD-10-CM | POA: Insufficient documentation

## 2021-08-29 DIAGNOSIS — L905 Scar conditions and fibrosis of skin: Secondary | ICD-10-CM | POA: Diagnosis present

## 2021-08-29 DIAGNOSIS — M79641 Pain in right hand: Secondary | ICD-10-CM | POA: Insufficient documentation

## 2021-08-29 NOTE — Therapy (Signed)
[facility] [address] [place], [address] Phone: [phone]   Fax:  [phone]  Occupational Therapy Evaluation  Patient Details  Name: Patricia Oliver MRN: [id number] Date of Birth: [birth year] Referring Provider (OT): DR Roland Rack   Encounter Date: 08/29/2021   OT End of Session - 08/29/21 1532     Visit Number 1    Number of Visits 8    Date for OT Re-Evaluation 10/24/21    OT Start Time 8366    OT Stop Time 1525    OT Time Calculation (min) 38 min    Activity Tolerance Patient tolerated treatment well    Behavior During Therapy [facility] for tasks assessed/performed             Past Medical History:  Diagnosis Date   Dysrhythmia    GERD (gastroesophageal reflux disease)    Hyperlipidemia     Past Surgical History:  Procedure Laterality Date   ABDOMINAL HYSTERECTOMY     APPENDECTOMY     BREAST BIOPSY Left 1996   neg   BREAST BIOPSY Left 04/06/2021   stereo bx, ribbon clip, path pending   COLONOSCOPY N/A 12/10/2014   Procedure: COLONOSCOPY;  Surgeon: Lollie Sails, MD;  Location: [facility] ENDOSCOPY;  Service: Endoscopy;  Laterality: N/A;   COLONOSCOPY WITH PROPOFOL N/A 11/21/2020   Procedure: COLONOSCOPY WITH PROPOFOL;  Surgeon: [name], MD;  Location: [facility] ENDOSCOPY;  Service: Gastroenterology;  Laterality: N/A;   EXCISION MASS UPPER EXTREMETIES Right 08/09/2021   Procedure: 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   Subjective Assessment - 08/29/21 1526     Subjective  My finger is really stiff and scar tissue tight and thick - still this little area of scab -  cannot really bend it to grip or pinch    Pertinent History Pt seen by ortho on 08/09/21 for her first postop appointment following  a excision of soft tissue mass with repair of extensor tendon involving the right index PIP joint. The patient underwent surgery with Dr. Roland Rack on 08/09/2021. 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 Overall the patient feels that she is doing well. She was diagnosed with strep throat and has been taking amoxicillin but she denies any fevers or chills at this time. The patient denies any trauma or injury affecting the right hand since surgery. The patient has been wearing a extension finger splint keeping the PIP joint in extension. The patient denies any numbness or ting to the right upper extremity at this time. The patient is right-hand dominant. She presents today for suture removal.   Refer to OT    Patient Stated Goals Want to be able to bend my finger and get my strength back to write, text, grip , squeeze and knit    Currently in Pain? Yes    Pain Score 2     Pain Location Finger (Comment which one)    Pain Orientation Right    Pain Descriptors / Indicators Tightness;Sore;Aching    Pain Type Surgical pain    Pain Onset 1 to 4 weeks ago    Pain Frequency Intermittent    Aggravating Factors  bending 2nd  scar thick and scab on distal part - pain 2/10 with flexion to 4 cm foam roll -tender over scar    Time 8    Period Weeks    Status New    Target Date 10/24/21                   Plan - 08/29/21 1534      Clinical Impression Statement Pt present at OT eval s/p  excision of soft tissue mass with repair of extensor tendon involving the right index PIP joint on 08/09/2021 by Dr Roland Rack. Pt is now tomorrow 3 wks s/p. Pt with thick scar tissue on ulnar side of proximal , PIP to middle phalanges - 1 cm scab on distal part of scar. Scar tiender and tight Marzella Schlein limiting pt's flexion of 2nd digit. Pt present with decrease motion , sensation and increase edema of 0.8 cm compare to L 2nd digit. Pt did arrive with -10 PIP extention but was able to regain during session with HEP review extention and maintain it- increase pain with composite fist , tender over scar and decrease strenght limiting her functional use of R dominant hand in ADL';s and IADL';s .    OT Occupational Profile and History Problem Focused Assessment - Including review of records relating to presenting problem    Occupational performance deficits (Please refer to evaluation for details): ADL's;IADL's;Work;Play;Leisure;Social Participation    Body Structure / Function / Physical Skills ADL;Coordination;FMC;Flexibility;Sensation;Scar mobility;IADL;ROM;Edema;UE functional use;Pain;Dexterity;Strength    Rehab Potential Good    Clinical Decision Making Limited treatment options, no task modification necessary    Comorbidities Affecting Occupational Performance: None    Modification or Assistance to Complete Evaluation  No modification of tasks or assist necessary to complete eval    OT Frequency 1x / week    OT Duration 8 weeks    OT Treatment/Interventions Self-care/ADL training;Paraffin;Ultrasound;Fluidtherapy;Contrast Bath;Therapeutic exercise;Manual Therapy;Patient/family education;Passive range of motion;Scar mobilization;Splinting    Consulted and Agree with Plan of Care Patient             Patient will benefit from skilled therapeutic intervention in order to improve the following deficits and impairments:   Body Structure / Function /  Physical Skills: ADL, Coordination, FMC, Flexibility, Sensation, Scar mobility, IADL, ROM, Edema, UE functional use, Pain, Dexterity, Strength       Visit Diagnosis: Scar condition and fibrosis of skin  Stiffness of right hand, not elsewhere classified  Pain in right hand  Muscle weakness (generalized)    Problem List Patient Active Problem List   Diagnosis Date Noted   Change in bowel habits 10/26/2020   Primary osteoarthritis of left knee 04/30/2018   Allergic rhinitis 11/19/2013   Esophageal reflux 11/19/2013   Other and unspecified hyperlipidemia 11/19/2013    Rosalyn Gess, OTR/L,CLT 08/29/2021, 7:29 PM  [facility] [address] [place], [address] Phone: [phone]   Fax:  [phone]  Name: JAMESINA GAUGH MRN: [id number] Date of Birth: August 07, 1950  [facility] [address] [place], [address] Phone: [phone]   Fax:  [phone]  Occupational Therapy Evaluation  Patient Details  Name: Patricia Oliver MRN: [id number] Date of Birth: [birth year] Referring Provider (OT): DR Roland Rack   Encounter Date: 08/29/2021   OT End of Session - 08/29/21 1532     Visit Number 1    Number of Visits 8    Date for OT Re-Evaluation 10/24/21    OT Start Time 8366    OT Stop Time 1525    OT Time Calculation (min) 38 min    Activity Tolerance Patient tolerated treatment well    Behavior During Therapy [facility] for tasks assessed/performed             Past Medical History:  Diagnosis Date   Dysrhythmia    GERD (gastroesophageal reflux disease)    Hyperlipidemia     Past Surgical History:  Procedure Laterality Date   ABDOMINAL HYSTERECTOMY     APPENDECTOMY     BREAST BIOPSY Left 1996   neg   BREAST BIOPSY Left 04/06/2021   stereo bx, ribbon clip, path pending   COLONOSCOPY N/A 12/10/2014   Procedure: COLONOSCOPY;  Surgeon: Lollie Sails, MD;  Location: [facility] ENDOSCOPY;  Service: Endoscopy;  Laterality: N/A;   COLONOSCOPY WITH PROPOFOL N/A 11/21/2020   Procedure: COLONOSCOPY WITH PROPOFOL;  Surgeon: [name], MD;  Location: [facility] ENDOSCOPY;  Service: Gastroenterology;  Laterality: N/A;   EXCISION MASS UPPER EXTREMETIES Right 08/09/2021   Procedure: 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   Subjective Assessment - 08/29/21 1526     Subjective  My finger is really stiff and scar tissue tight and thick - still this little area of scab -  cannot really bend it to grip or pinch    Pertinent History Pt seen by ortho on 08/09/21 for her first postop appointment following  a excision of soft tissue mass with repair of extensor tendon involving the right index PIP joint. The patient underwent surgery with Dr. Roland Rack on 08/09/2021. 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 Overall the patient feels that she is doing well. She was diagnosed with strep throat and has been taking amoxicillin but she denies any fevers or chills at this time. The patient denies any trauma or injury affecting the right hand since surgery. The patient has been wearing a extension finger splint keeping the PIP joint in extension. The patient denies any numbness or ting to the right upper extremity at this time. The patient is right-hand dominant. She presents today for suture removal.   Refer to OT    Patient Stated Goals Want to be able to bend my finger and get my strength back to write, text, grip , squeeze and knit    Currently in Pain? Yes    Pain Score 2     Pain Location Finger (Comment which one)    Pain Orientation Right    Pain Descriptors / Indicators Tightness;Sore;Aching    Pain Type Surgical pain    Pain Onset 1 to 4 weeks ago    Pain Frequency Intermittent    Aggravating Factors  bending 2nd

## 2021-08-30 ENCOUNTER — Other Ambulatory Visit: Payer: Self-pay

## 2021-08-31 ENCOUNTER — Other Ambulatory Visit: Payer: Self-pay

## 2021-08-31 MED ORDER — FLUTICASONE PROPIONATE 50 MCG/ACT NA SUSP
NASAL | 1 refills | Status: DC
  Filled 2021-08-31: qty 48, 90d supply, fill #0
  Filled 2021-12-06: qty 48, 90d supply, fill #1

## 2021-09-06 ENCOUNTER — Other Ambulatory Visit: Payer: Self-pay

## 2021-09-06 ENCOUNTER — Ambulatory Visit: Payer: Medicare PPO | Admitting: Occupational Therapy

## 2021-09-06 DIAGNOSIS — L905 Scar conditions and fibrosis of skin: Secondary | ICD-10-CM

## 2021-09-06 DIAGNOSIS — M6281 Muscle weakness (generalized): Secondary | ICD-10-CM

## 2021-09-06 DIAGNOSIS — M25641 Stiffness of right hand, not elsewhere classified: Secondary | ICD-10-CM

## 2021-09-06 DIAGNOSIS — M79641 Pain in right hand: Secondary | ICD-10-CM

## 2021-09-06 NOTE — Therapy (Signed)
[place] [facility] [address], [place], [address] Phone: [phone]   Fax:  [phone]  Occupational Therapy Treatment  Patient Details  Name: Patricia Oliver MRN: [id number] Date of Birth: 03-23-1951 Referring Provider (OT): DR Joice Lofts   Encounter Date: 09/06/2021   OT End of Session - 09/06/21 1412     Visit Number 2    Number of Visits 8    Date for OT Re-Evaluation 10/24/21    OT Start Time 1402    OT Stop Time 1435    OT Time Calculation (min) 33 min    Activity Tolerance Patient tolerated treatment well    Behavior During Therapy [facility] for tasks assessed/performed             Past Medical History:  Diagnosis Date   Dysrhythmia    GERD (gastroesophageal reflux disease)    Hyperlipidemia     Past Surgical History:  Procedure Laterality Date   ABDOMINAL HYSTERECTOMY     APPENDECTOMY     BREAST BIOPSY Left 1996   neg   BREAST BIOPSY Left 04/06/2021   stereo bx, ribbon clip, path pending   COLONOSCOPY N/A 12/10/2014   Procedure: COLONOSCOPY;  Surgeon: Christena Deem, MD;  Location: [facility] ENDOSCOPY;  Service: Endoscopy;  Laterality: N/A;   COLONOSCOPY WITH PROPOFOL N/A 11/21/2020   Procedure: COLONOSCOPY WITH PROPOFOL;  Surgeon: Wyline Mood, MD;  Location: [facility] ENDOSCOPY;  Service: Gastroenterology;  Laterality: N/A;   EXCISION MASS UPPER EXTREMETIES Right 08/09/2021   Procedure: 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   Subjective Assessment - 09/06/21 1410     Subjective  Doing okay - my exercises about 2 x day - and wore the silicon sleeve on finger but my finger got little red last night - so left it off since last night - swelling still there and stiff in the am -but able to write now and  was able to put cup in microwave    Pertinent History Pt seen by ortho on 08/09/21 for her first postop appointment following a excision of soft tissue mass with repair of extensor tendon involving the right index PIP joint. The patient underwent surgery with Dr. Joice Lofts on 08/09/2021. 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 Overall the patient feels that she is doing well. She was diagnosed with strep throat and has been taking amoxicillin but she denies any fevers or chills at this time. The patient denies any trauma or injury affecting the right hand since surgery. The patient has been wearing a extension finger splint keeping the PIP joint in extension. The patient denies any numbness or ting to the right upper extremity at this time. The patient is right-hand dominant. She presents today for suture removal.   Refer to OT    Patient Stated Goals Want to be able to bend my finger and get my strength back to write, text, grip , squeeze and knit    Currently in Pain? No/denies                [facility] OT Assessment - 09/06/21 0001       Right Hand AROM   R Index  MCP 0-90 75 Degrees   80 in session   R Index PIP 0-100 90 Degrees   0   R Index DIP 0-70 50 Degrees                      OT Treatments/Exercises (OP) - 09/06/21 0001       RUE Contrast Bath   Time 8 minutes    Comments R hand decease edema and icnrease ROM              increase extention to 0 degrees  and scar tissue not as tender and adhere Still tender distally and tight at lateral PIP joint    Tendon glides - AROM focus on MC flexion with PIP extention  Intrinsic and full fist stop when feel pull to 2/10  Fisting to foam roller and one finger out of palm now  10 reps -  2-3 x day after contrast  - rolling for extention of digits - 20 reps    Scar massage and keep open for 24-48 hrs and then try night time cica scar pad and coban wrap  Try again  just in daytime  silicon sleeve  decrease edema and scar tissue and increase AROM - goal         OT Education - 09/06/21 1412     Education Details progress and HEP changes    Person(s) Educated Patient    Methods Explanation;Demonstration;Tactile cues;Verbal cues;Handout    Comprehension Verbal cues required;Returned demonstration;Verbalized understanding                 OT Long Term Goals - 08/29/21 1911       OT LONG TERM GOAL #1   Title Pt to be independent in HEP to decrease scar tissue , increase flexion to touch palm with pain less than 2/10    Baseline scar thick on ulnar prox to middle phalange - scab on distal part- MC 70, 75 PIP , DIP 35    Time 4    Period Weeks    Status New    Target Date 09/26/21      OT LONG TERM GOAL #2   Title R 2nd digit flexion and extention increase to WNL  to  write , open packages, turn key with no issues    Baseline PIP extention -10 , Flexion 70 MC, PIP 75 and DIP 35 - pain 2/10 with fist to 4 cm foam block -scar thick ,adhere and    Time 6    Period Weeks    Status New    Target Date 10/10/21      OT LONG TERM GOAL #3   Title R grip and prehension strength increase to more than 75% compare to L to open packages, cut food, hold plate, turn key, do buttons    Baseline NT - pt tomorrow 3 wks s/p - scar thick and scab on distal part - pain 2/10 with flexion to 4 cm foam roll -tender over scar    Time 8    Period Weeks    Status New    Target Date 10/24/21                   Plan - 09/06/21 1450     Clinical Impression Statement Pt present at OT eval s/p  excision of soft tissue mass with repair of extensor tendon involving the right index PIP joint on 08/09/2021 by Dr Joice Lofts. Pt is now 4  wks s/p. Pt scar still thick but not as adhere. - tight with ROM. and tender distally.  Pt  cont to show  decrease motion , sensation and continues increase edema of 0.8 cm compare to L 2nd digit. Pt AROM mproved for extention of PIP 0  , and flexion increase at all joints and after contrast in session. Report increase use.  Pt little red from wearing silicon sleeve - pt to leave off for 24-48 hrs and try cica scarpad and coban wrap for night time. Review again HEP extention and flexion , scar massage and edema control with decrease strenght limiting her functional use of R dominant hand in ADL';s and IADL';s .    OT Occupational Profile and History Problem Focused Assessment - Including review of records relating to presenting problem    Occupational performance deficits (Please refer to evaluation for details): ADL's;IADL's;Work;Play;Leisure;Social Participation    Body Structure / Function / Physical Skills ADL;Coordination;FMC;Flexibility;Sensation;Scar mobility;IADL;ROM;Edema;UE functional use;Pain;Dexterity;Strength    Rehab Potential Good    Clinical Decision Making Limited treatment options, no task modification necessary    Comorbidities Affecting Occupational Performance: None    Modification or Assistance to Complete Evaluation  No modification of tasks or assist necessary to complete eval    OT Frequency 1x / week    OT Duration 8 weeks    OT Treatment/Interventions Self-care/ADL training;Paraffin;Ultrasound;Fluidtherapy;Contrast Bath;Therapeutic exercise;Manual Therapy;Patient/family education;Passive range of motion;Scar mobilization;Splinting    Consulted and Agree with Plan of Care Patient             Patient will benefit from skilled therapeutic intervention in order to improve the following deficits and impairments:   Body Structure / Function / Physical Skills: ADL, Coordination, FMC, Flexibility, Sensation, Scar mobility, IADL, ROM, Edema, UE functional use, Pain, Dexterity, Strength       Visit Diagnosis: Scar condition and fibrosis of skin  Stiffness of right hand, not elsewhere classified  Pain in right hand  Muscle weakness (generalized)    Problem List Patient Active Problem List    Diagnosis Date Noted   Change in bowel habits 10/26/2020   Primary osteoarthritis of left knee 04/30/2018   Allergic rhinitis 11/19/2013   Esophageal reflux 11/19/2013   Other and unspecified hyperlipidemia 11/19/2013    Oletta Cohn, OTR/L,CLT 09/06/2021, 2:56 PM  [place] [facility] [address], [place], [address] Phone: [phone]   Fax:  [phone]  Name: EMANEE TARRICONE MRN: [id number] Date of Birth: July 14, 1951

## 2021-09-08 ENCOUNTER — Other Ambulatory Visit: Payer: Self-pay

## 2021-09-12 ENCOUNTER — Other Ambulatory Visit: Payer: Self-pay

## 2021-09-12 ENCOUNTER — Ambulatory Visit: Payer: Medicare PPO | Admitting: Occupational Therapy

## 2021-09-12 DIAGNOSIS — M79641 Pain in right hand: Secondary | ICD-10-CM

## 2021-09-12 DIAGNOSIS — L905 Scar conditions and fibrosis of skin: Secondary | ICD-10-CM

## 2021-09-12 DIAGNOSIS — M25641 Stiffness of right hand, not elsewhere classified: Secondary | ICD-10-CM

## 2021-09-12 DIAGNOSIS — M6281 Muscle weakness (generalized): Secondary | ICD-10-CM

## 2021-09-12 NOTE — Therapy (Signed)
[place] [facility] [address] [place], [address] Phone: [phone]   Fax:  [phone]  Occupational Therapy Treatment  Patient Details  Name: Patricia Oliver MRN: [id number] Date of Birth: 01-20-1951 Referring Provider (OT): DR Joice Lofts   Encounter Date: 09/12/2021   OT End of Session - 09/12/21 1403     Visit Number 3    Number of Visits 8    Date for OT Re-Evaluation 10/24/21    OT Start Time 1402    OT Stop Time 1442    OT Time Calculation (min) 40 min    Activity Tolerance Patient tolerated treatment well    Behavior During Therapy [place] for tasks assessed/performed             Past Medical History:  Diagnosis Date   Dysrhythmia    GERD (gastroesophageal reflux disease)    Hyperlipidemia     Past Surgical History:  Procedure Laterality Date   ABDOMINAL HYSTERECTOMY     APPENDECTOMY     BREAST BIOPSY Left 1996   neg   BREAST BIOPSY Left 04/06/2021   stereo bx, ribbon clip, path pending   COLONOSCOPY N/A 12/10/2014   Procedure: COLONOSCOPY;  Surgeon: Christena Deem, MD;  Location: [facility] ENDOSCOPY;  Service: Endoscopy;  Laterality: N/A;   COLONOSCOPY WITH PROPOFOL N/A 11/21/2020   Procedure: COLONOSCOPY WITH PROPOFOL;  Surgeon: Wyline Mood, MD;  Location: [facility] ENDOSCOPY;  Service: Gastroenterology;  Laterality: N/A;   EXCISION MASS UPPER EXTREMETIES Right 08/09/2021   Procedure: 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   Subjective Assessment - 09/12/21 1403     Subjective  DOing better- my swelling is better with using that wrap and I love that scar pad - did wear it during day - writing better and using my finger - scar still little tender    Pertinent History Pt seen by ortho on 08/09/21 for  her first postop appointment following a excision of soft tissue mass with repair of extensor tendon involving the right index PIP joint. The patient underwent surgery with Dr. Joice Lofts on 08/09/2021. 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 Overall the patient feels that she is doing well. She was diagnosed with strep throat and has been taking amoxicillin but she denies any fevers or chills at this time. The patient denies any trauma or injury affecting the right hand since surgery. The patient has been wearing a extension finger splint keeping the PIP joint in extension. The patient denies any numbness or ting to the right upper extremity at this time. The patient is right-hand dominant. She presents today for suture removal.   Refer to OT    Patient Stated Goals Want to be able to bend my finger and get my strength back to write, text, grip , squeeze and knit    Currently in Pain? No/denies                [facility] OT Assessment - 09/12/21 0001       Right Hand AROM   R Index  MCP 0-90 80 Degrees   0 ext- 90 in session   R Index PIP 0-100 90 Degrees  0 ext -and 95 in session   R Index DIP 0-70 50 Degrees                      OT Treatments/Exercises (OP) - 09/12/21 0001       RUE Paraffin   Number Minutes Paraffin 8 Minutes    RUE Paraffin Location Hand    Comments decrease stiffness ,decrease scar tissue , increase AROM            increase extention to 0 degrees  and scar tissue not as tender and adhere Still tender distally and tight at lateral PIP joint - done scar massaging using mini massager and piece of coban for traction - pt to use at home too   Tendon glides - AROM focus on MC flexion with PIP extention  Intrinsic  - DIP /PIP flexion stretch 3-4 x 30 sec  Prior to intrinsic fist and full fist s Able to touch palm this date- pain less than 2/10  10 reps -  2-3 x day after contrast /or heat - rolling  for extention of digits - 20 reps    Focus on decrease edema and scar tissue and increase AROM - MC flexion, intrinsic stretch  And then composite touching palm           OT Education - 09/12/21 1403     Education Details progress and HEP changes    Person(s) Educated Patient    Methods Explanation;Demonstration;Tactile cues;Verbal cues;Handout    Comprehension Verbal cues required;Returned demonstration;Verbalized understanding                 OT Long Term Goals - 08/29/21 1911       OT LONG TERM GOAL #1   Title Pt to be independent in HEP to decrease scar tissue , increase flexion to touch palm with pain less than 2/10    Baseline scar thick on ulnar prox to middle phalange - scab on distal part- MC 70, 75 PIP , DIP 35    Time 4    Period Weeks    Status New    Target Date 09/26/21      OT LONG TERM GOAL #2   Title R 2nd digit flexion and extention increase to WNL  to  write , open packages, turn key with no issues    Baseline PIP extention -10 , Flexion 70 MC, PIP 75 and DIP 35 - pain 2/10 with fist to 4 cm foam block -scar thick ,adhere and    Time 6    Period Weeks    Status New    Target Date 10/10/21      OT LONG TERM GOAL #3   Title R grip and prehension strength increase to more than 75% compare to L to open packages, cut food, hold plate, turn key, do buttons    Baseline NT - pt tomorrow 3 wks s/p - scar thick and scab on distal part - pain 2/10 with flexion to 4 cm foam roll -tender over scar    Time 8    Period Weeks    Status New    Target Date 10/24/21                   Plan - 09/12/21 1404     Clinical Impression Statement Pt present at OT eval s/p  excision of soft tissue mass with repair of extensor tendon involving the right index PIP joint on 08/09/2021 by Dr  Poggi. Pt is now 5 wks s/p. Pt scar still adhere and tender distal at PIP joint. Tightness more than pain with ROM.   Edema decrease to 0.5 cm , increase sensation per pt -  show increase ARM  in L 2nd digit- pt to focus now on MC flexion and PIP/DIP flexion stretch. Report increase functional use. Pt to use cica scar pad with coban wrap night time - cont to show decrease ROM , and increase scar tissue and stiffness with decrease strenght limiting her functional use of R dominant hand in ADL';s and IADL';s .    OT Occupational Profile and History Problem Focused Assessment - Including review of records relating to presenting problem    Occupational performance deficits (Please refer to evaluation for details): ADL's;IADL's;Work;Play;Leisure;Social Participation    Body Structure / Function / Physical Skills ADL;Coordination;FMC;Flexibility;Sensation;Scar mobility;IADL;ROM;Edema;UE functional use;Pain;Dexterity;Strength    Rehab Potential Good    Clinical Decision Making Limited treatment options, no task modification necessary    Comorbidities Affecting Occupational Performance: None    Modification or Assistance to Complete Evaluation  No modification of tasks or assist necessary to complete eval    OT Frequency 1x / week    OT Duration 6 weeks    OT Treatment/Interventions Self-care/ADL training;Paraffin;Ultrasound;Fluidtherapy;Contrast Bath;Therapeutic exercise;Manual Therapy;Patient/family education;Passive range of motion;Scar mobilization;Splinting    Consulted and Agree with Plan of Care Patient             Patient will benefit from skilled therapeutic intervention in order to improve the following deficits and impairments:   Body Structure / Function / Physical Skills: ADL, Coordination, FMC, Flexibility, Sensation, Scar mobility, IADL, ROM, Edema, UE functional use, Pain, Dexterity, Strength       Visit Diagnosis: Scar condition and fibrosis of skin  Stiffness of right hand, not elsewhere classified  Pain in right hand  Muscle weakness (generalized)    Problem List Patient Active Problem List   Diagnosis Date Noted   Change in bowel habits  10/26/2020   Primary osteoarthritis of left knee 04/30/2018   Allergic rhinitis 11/19/2013   Esophageal reflux 11/19/2013   Other and unspecified hyperlipidemia 11/19/2013    Oletta Cohn, OTR/L,CLT 09/12/2021, 2:47 PM  [place] [facility] [address] [place], [address] Phone: [phone]   Fax:  [phone]  Name: FAYNELL CHURCHWELL MRN: [id number] Date of Birth: 1951/02/14

## 2021-09-13 ENCOUNTER — Encounter: Payer: Medicare PPO | Admitting: Occupational Therapy

## 2021-09-21 ENCOUNTER — Ambulatory Visit: Payer: Medicare PPO | Attending: Student | Admitting: Occupational Therapy

## 2021-09-21 ENCOUNTER — Other Ambulatory Visit: Payer: Self-pay

## 2021-09-21 DIAGNOSIS — M6281 Muscle weakness (generalized): Secondary | ICD-10-CM | POA: Diagnosis present

## 2021-09-21 DIAGNOSIS — M79641 Pain in right hand: Secondary | ICD-10-CM | POA: Insufficient documentation

## 2021-09-21 DIAGNOSIS — M25641 Stiffness of right hand, not elsewhere classified: Secondary | ICD-10-CM | POA: Diagnosis present

## 2021-09-21 DIAGNOSIS — L905 Scar conditions and fibrosis of skin: Secondary | ICD-10-CM | POA: Diagnosis present

## 2021-09-21 NOTE — Therapy (Signed)
Plan - 09/21/21 1737     Clinical Impression Statement Pt present at OT eval s/p  excision of  soft tissue mass with repair of extensor tendon involving the right index PIP joint on 08/09/2021 by Dr Roland Rack. Pt is now 6 wks s/p. Pt scar still adhere and tender distal at PIP joint that took longest to heal. Tightness more than pain with ROM.   Edema decrease to 0.6 cm , increase sensation per pt - show increase AROM  in L 2nd digit-  able to maintain extention as progressing in flexion- this date focussing on DIP/PIP flexion and flexion strap provided to use. Prehension strenght /pinch WNL.  Report increase functional use. Pt to use isotoner glove at night time and silicon sleeve some during day for scar tissue and edema. Cont to show decrease composite flexion  , and increase scar tissue and stiffness - but increase strength and functional use of R dominant hand in ADL';s and IADL';s .    OT Occupational Profile and History Problem Focused Assessment - Including review of records relating to presenting problem    Occupational performance deficits (Please refer to evaluation for details): ADL's;IADL's;Work;Play;Leisure;Social Participation    Body Structure / Function / Physical Skills ADL;Coordination;FMC;Flexibility;Sensation;Scar mobility;IADL;ROM;Edema;UE functional use;Pain;Dexterity;Strength    Rehab Potential Good    Clinical Decision Making Limited treatment options, no task modification necessary    Comorbidities Affecting Occupational Performance: None    Modification or Assistance to Complete Evaluation  No modification of tasks or assist necessary to complete eval    OT Frequency 1x / week    OT Duration 6 weeks    OT Treatment/Interventions Self-care/ADL training;Paraffin;Ultrasound;Fluidtherapy;Contrast Bath;Therapeutic exercise;Manual Therapy;Patient/family education;Passive range of motion;Scar mobilization;Splinting    Consulted and Agree with Plan of Care Patient             Patient will benefit from skilled therapeutic intervention in order to improve the following deficits  and impairments:   Body Structure / Function / Physical Skills: ADL, Coordination, FMC, Flexibility, Sensation, Scar mobility, IADL, ROM, Edema, UE functional use, Pain, Dexterity, Strength       Visit Diagnosis: Scar condition and fibrosis of skin  Stiffness of right hand, not elsewhere classified  Pain in right hand  Muscle weakness (generalized)    Problem List Patient Active Problem List   Diagnosis Date Noted   Change in bowel habits 10/26/2020   Primary osteoarthritis of left knee 04/30/2018   Allergic rhinitis 11/19/2013   Esophageal reflux 11/19/2013   Other and unspecified hyperlipidemia 11/19/2013    Rosalyn Gess, OTR/L,CLT 09/21/2021, 5:42 PM  [facility] [facility] [address] [place], [address] Phone: [phone]   Fax:  [phone]  Name: Patricia Oliver MRN: [id number] Date of Birth: 1950-08-10  [place] [facility] [address] [place], [address] Phone: [phone]   Fax:  [phone]  Occupational Therapy Treatment  Patient Details  Name: Patricia Oliver MRN: [id number] Date of Birth: 01-19-1951 Referring Provider (OT): DR Roland Rack   Encounter Date: 09/21/2021   OT End of Session - 09/21/21 1508     Visit Number 4    Number of Visits 8    Date for OT Re-Evaluation 10/24/21    OT Start Time 1856    OT Stop Time 1525    OT Time Calculation (min) 40 min    Activity Tolerance Patient tolerated treatment well    Behavior During Therapy [facility] for tasks assessed/performed             Past Medical History:  Diagnosis Date   Dysrhythmia    GERD (gastroesophageal reflux disease)    Hyperlipidemia     Past Surgical History:  Procedure Laterality Date   ABDOMINAL HYSTERECTOMY     APPENDECTOMY     BREAST BIOPSY Left 1996   neg   BREAST BIOPSY Left 04/06/2021   stereo bx, ribbon clip, path pending   COLONOSCOPY N/A 12/10/2014   Procedure: COLONOSCOPY;  Surgeon: Lollie Sails, MD;  Location: [facility] END[facility]COPY;  Service: Endoscopy;  Laterality: N/A;   COLONOSCOPY WITH PROPOFOL N/A 11/21/2020   Procedure: COLONOSCOPY WITH PROPOFOL;  Surgeon: [name], MD;  Location: [facility] ENDOSCOPY;  Service: Gastroenterology;  Laterality: N/A;   EXCISION MASS UPPER EXTREMETIES Right 08/09/2021   Procedure: 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   Subjective Assessment - 09/21/21 1507     Subjective  Able to use it more and motion better as well as swelling - buty not able to make tight fist - I know I cheat with that thumb    Pertinent History Pt seen by ortho on 08/09/21 for her first postop appointment following a  excision of soft tissue mass with repair of extensor tendon involving the right index PIP joint. The patient underwent surgery with Dr. Roland Rack on 08/09/2021. 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 Overall the patient feels that she is doing well. She was diagnosed with strep throat and has been taking amoxicillin but she denies any fevers or chills at this time. The patient denies any trauma or injury affecting the right hand since surgery. The patient has been wearing a extension finger splint keeping the PIP joint in extension. The patient denies any numbness or ting to the right upper extremity at this time. The patient is right-hand dominant. She presents today for suture removal.   Refer to OT    Patient Stated Goals Want to be able to bend my finger and get my strength back to write, text, grip , squeeze and knit    Currently in Pain? Yes    Pain Score 2     Pain Location Finger (Comment which one)    Pain Orientation Right    Pain Descriptors / Indicators Tightness;Tender;Aching    Pain Type Surgical pain    Pain Onset More than a month ago    Pain Frequency Intermittent  Plan - 09/21/21 1737     Clinical Impression Statement Pt present at OT eval s/p  excision of  soft tissue mass with repair of extensor tendon involving the right index PIP joint on 08/09/2021 by Dr Roland Rack. Pt is now 6 wks s/p. Pt scar still adhere and tender distal at PIP joint that took longest to heal. Tightness more than pain with ROM.   Edema decrease to 0.6 cm , increase sensation per pt - show increase AROM  in L 2nd digit-  able to maintain extention as progressing in flexion- this date focussing on DIP/PIP flexion and flexion strap provided to use. Prehension strenght /pinch WNL.  Report increase functional use. Pt to use isotoner glove at night time and silicon sleeve some during day for scar tissue and edema. Cont to show decrease composite flexion  , and increase scar tissue and stiffness - but increase strength and functional use of R dominant hand in ADL';s and IADL';s .    OT Occupational Profile and History Problem Focused Assessment - Including review of records relating to presenting problem    Occupational performance deficits (Please refer to evaluation for details): ADL's;IADL's;Work;Play;Leisure;Social Participation    Body Structure / Function / Physical Skills ADL;Coordination;FMC;Flexibility;Sensation;Scar mobility;IADL;ROM;Edema;UE functional use;Pain;Dexterity;Strength    Rehab Potential Good    Clinical Decision Making Limited treatment options, no task modification necessary    Comorbidities Affecting Occupational Performance: None    Modification or Assistance to Complete Evaluation  No modification of tasks or assist necessary to complete eval    OT Frequency 1x / week    OT Duration 6 weeks    OT Treatment/Interventions Self-care/ADL training;Paraffin;Ultrasound;Fluidtherapy;Contrast Bath;Therapeutic exercise;Manual Therapy;Patient/family education;Passive range of motion;Scar mobilization;Splinting    Consulted and Agree with Plan of Care Patient             Patient will benefit from skilled therapeutic intervention in order to improve the following deficits  and impairments:   Body Structure / Function / Physical Skills: ADL, Coordination, FMC, Flexibility, Sensation, Scar mobility, IADL, ROM, Edema, UE functional use, Pain, Dexterity, Strength       Visit Diagnosis: Scar condition and fibrosis of skin  Stiffness of right hand, not elsewhere classified  Pain in right hand  Muscle weakness (generalized)    Problem List Patient Active Problem List   Diagnosis Date Noted   Change in bowel habits 10/26/2020   Primary osteoarthritis of left knee 04/30/2018   Allergic rhinitis 11/19/2013   Esophageal reflux 11/19/2013   Other and unspecified hyperlipidemia 11/19/2013    Rosalyn Gess, OTR/L,CLT 09/21/2021, 5:42 PM  [place] [facility] [address] [place], [address] Phone: [phone]   Fax:  [phone]  Name: Patricia Oliver MRN: [id number] Date of Birth: 1950-08-10

## 2021-10-05 ENCOUNTER — Other Ambulatory Visit: Payer: Self-pay

## 2021-10-05 ENCOUNTER — Ambulatory Visit: Payer: Medicare PPO | Admitting: Occupational Therapy

## 2021-10-05 ENCOUNTER — Encounter: Payer: Medicare PPO | Admitting: Occupational Therapy

## 2021-10-05 DIAGNOSIS — L905 Scar conditions and fibrosis of skin: Secondary | ICD-10-CM | POA: Diagnosis not present

## 2021-10-05 NOTE — Therapy (Signed)
[place] ?[facility] ?[address] ?[place], [place], [address] ?Phone: [phone]   Fax:  [phone] ? ?Occupational Therapy Treatment ? ?Patient Details  ?Name: Patricia Oliver ?MRN: [id number] ?Date of Birth: 02-01-1951 ?Referring Provider (OT): DR Poggi ? ? ?Encounter Date: 10/05/2021 ? ? OT End of Session - 10/05/21 1233   ? ? Visit Number 5   ? Number of Visits 8   ? Date for OT Re-Evaluation 10/24/21   ? OT Start Time 1202   ? OT Stop Time 1240   ? OT Time Calculation (min) 38 min   ? Activity Tolerance Patient tolerated treatment well   ? Behavior During Therapy [facility] for tasks assessed/performed   ? ?  ?  ? ?  ? ? ?Past Medical History:  ?Diagnosis Date  ? Dysrhythmia   ? GERD (gastroesophageal reflux disease)   ? Hyperlipidemia   ? ? ?Past Surgical History:  ?Procedure Laterality Date  ? ABDOMINAL HYSTERECTOMY    ? APPENDECTOMY    ? BREAST BIOPSY Left 1996  ? neg  ? BREAST BIOPSY Left 04/06/2021  ? stereo bx, ribbon clip, path pending  ? COLONOSCOPY N/A 12/10/2014  ? Procedure: COLONOSCOPY;  Surgeon: Christena Deem, MD;  Location: [facility] ENDOSCOPY;  Service: Endoscopy;  Laterality: N/A;  ? COLONOSCOPY WITH PROPOFOL N/A 11/21/2020  ? Procedure: COLONOSCOPY WITH PROPOFOL;  Surgeon: Wyline Mood, MD;  Location: [facility] ENDOSCOPY;  Service: Gastroenterology;  Laterality: N/A;  ? EXCISION MASS UPPER EXTREMETIES Right 08/09/2021  ? 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 ORIF ANKLE FRACTURE    ? ? ?There were no vitals filed for this visit. ? ? Subjective Assessment - 10/05/21 1206   ? ? Subjective  I have seen DR Poggi but since then I have these 4 knots coming up on my joint - but no pain - using it more - just cannot make tight fist - better   ? Pertinent History Pt seen by ortho on 08/09/21 for her first postop  appointment following a excision of soft tissue mass with repair of extensor tendon involving the right index PIP joint. The patient underwent surgery with Dr. Joice Lofts on 08/09/2021. 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 Overall the patient feels that she is doing well. She was diagnosed with strep throat and has been taking amoxicillin but she denies any fevers or chills at this time. The patient denies any trauma or injury affecting the right hand since surgery. The patient has been wearing a extension finger splint keeping the PIP joint in extension. The patient denies any numbness or ting to the right upper extremity at this time. The patient is right-hand dominant. She presents today for suture removal.   Refer to OT   ? Patient Stated Goals Want to be able to bend my finger and get my strength back to write, text, grip , squeeze and knit   ? Currently in Pain? No/denies   ? ?  ?  ? ?  ? ? ? ? ? [facility] OT Assessment - 10/05/21 0001   ? ?  ? Strength  ? Right Hand Grip (lbs) 40   ? Right Hand Lateral Pinch 13 lbs   ? Right Hand 3 Point Pinch 12 lbs   ?  Left Hand Grip (lbs) 46   ? Left Hand Lateral Pinch 13 lbs   ? Left Hand 3 Point Pinch 11 lbs   ?  ? Right Hand AROM  ? R Index  MCP 0-90 85 Degrees   ? R Index PIP 0-100 95 Degrees  - 100 in session  ? R Index DIP 0-70 55 Degrees   ? ?  ?  ? ?  ? ?grip and prehension in range for her age  ? ? ? ? ? ? ? ? ? OT Treatments/Exercises (OP) - 10/05/21 0001   ? ?  ? RUE Paraffin  ? Number Minutes Paraffin 8 Minutes   ? RUE Paraffin Location Hand   ? Comments decrease stiffness and increase ROM   ? ?  ?  ? ?  ? ? ?2nd PIP extention 0 degrees  and scar tissue not as tender and adhere ?Still tender distally and tight at lateral PIP joint - done scar massaging using mini massager  and xtractor  ?Pt to cont at home ?  ?Intrinsic  - DIP /PIP flexion stretch 3-4 x 30 sec  ?And fabricate  last time for pt DIP/PIP strap to  use at home during day for 2 min at time to increase intrinsic fist and composite ?Prior to intrinsic fist and full fist  ?Able to touch palm this date- pain less than 2/10 - but cont to have edema over proximal phalanges ?10 reps -  ?2-3 x day after contrast /or heat ?- rolling for extention of digits - 20 reps  ?  ?Focus on decrease edema and scar tissue and increase AROM  PIP/DIP flexion - composite -the next month  ? ? ? ? ? ? OT Education - 10/05/21 1232   ? ? Education Details progress and HEP changes   ? Person(s) Educated Patient   ? Methods Explanation;Demonstration;Tactile cues;Verbal cues;Handout   ? Comprehension Verbal cues required;Returned demonstration;Verbalized understanding   ? ?  ?  ? ?  ? ? ? ? ? ? OT Long Term Goals - 08/29/21 1911   ? ?  ? OT LONG TERM GOAL #1  ? Title Pt to be independent in HEP to decrease scar tissue , increase flexion to touch palm with pain less than 2/10   ? Baseline scar thick on ulnar prox to middle phalange - scab on distal part- MC 70, 75 PIP , DIP 35   ? Time 4   ? Period Weeks   ? Status New   ? Target Date 09/26/21   ?  ? OT LONG TERM GOAL #2  ? Title R 2nd digit flexion and extention increase to WNL  to  write , open packages, turn key with no issues   ? Baseline PIP extention -10 , Flexion 70 MC, PIP 75 and DIP 35 - pain 2/10 with fist to 4 cm foam block -scar thick ,adhere and   ? Time 6   ? Period Weeks   ? Status New   ? Target Date 10/10/21   ?  ? OT LONG TERM GOAL #3  ? Title R grip and prehension strength increase to more than 75% compare to L to open packages, cut food, hold plate, turn key, do buttons   ? Baseline NT - pt tomorrow 3 wks s/p - scar thick and scab on distal part - pain 2/10 with flexion to 4 cm foam roll -tender over scar   ? Time 8   ? Period Weeks   ?  Status New   ? Target Date 10/24/21   ? ?  ?  ? ?  ? ? ? ? ? ? ? ? Plan - 10/05/21 1246   ? ? Clinical Impression Statement Pt present at OT eval s/p  excision of soft tissue mass with  repair of extensor tendon involving the right index PIP joint on 08/09/2021 by Dr Joice Lofts. Pt is now 8 wks s/p. Pt cont to make progress in AROM of 2nd digit- scar improving - grip and prehension in range for her age but decrease grip compare to L hand. Pt able to use hand in most every task. Cont to have edema more than 0.6 cm in proximal phalanges and PIP. Pt to cont to decrease edema and focus  on DIP/PIP flexion and flexion strap provided to use. Cont to show decrease composite flexion  , and increase scar tissue and stiffness - but increase strength and functional use of R dominant hand in ADL';s and IADL';s . Pt do show 4 tiny knots on dorsal PIP when making fist - pt to take picture to send for Dr Joice Lofts - pt to follow up in month   ? OT Occupational Profile and History Problem Focused Assessment - Including review of records relating to presenting problem   ? Occupational performance deficits (Please refer to evaluation for details): ADL's;IADL's;Work;Play;Leisure;Social Participation   ? Body Structure / Function / Physical Skills ADL;Coordination;FMC;Flexibility;Sensation;Scar mobility;IADL;ROM;Edema;UE functional use;Pain;Dexterity;Strength   ? Rehab Potential Good   ? Clinical Decision Making Limited treatment options, no task modification necessary   ? Comorbidities Affecting Occupational Performance: None   ? Modification or Assistance to Complete Evaluation  No modification of tasks or assist necessary to complete eval   ? OT Frequency Monthly   ? OT Duration 4 weeks   ? OT Treatment/Interventions Self-care/ADL training;Paraffin;Ultrasound;Fluidtherapy;Contrast Bath;Therapeutic exercise;Manual Therapy;Patient/family education;Passive range of motion;Scar mobilization;Splinting   ? Consulted and Agree with Plan of Care Patient   ? ?  ?  ? ?  ? ? ?Patient will benefit from skilled therapeutic intervention in order to improve the following deficits and impairments:   ?Body Structure / Function / Physical  Skills: ADL, Coordination, FMC, Flexibility, Sensation, Scar mobility, IADL, ROM, Edema, UE functional use, Pain, Dexterity, Strength ?  ?  ? ? ?Visit Diagnosis: ?Scar condition and fibrosis of skin ? ?Stiffness of

## 2021-10-06 ENCOUNTER — Other Ambulatory Visit: Payer: Self-pay

## 2021-10-06 MED ORDER — CELECOXIB 200 MG PO CAPS
ORAL_CAPSULE | ORAL | 1 refills | Status: DC
  Filled 2021-10-06: qty 180, 90d supply, fill #0
  Filled 2022-01-02: qty 180, 90d supply, fill #1

## 2021-11-01 ENCOUNTER — Other Ambulatory Visit: Payer: Self-pay

## 2021-11-01 MED ORDER — DICYCLOMINE HCL 10 MG PO CAPS
ORAL_CAPSULE | ORAL | 0 refills | Status: DC
  Filled 2021-11-01: qty 360, 90d supply, fill #0

## 2021-11-02 ENCOUNTER — Other Ambulatory Visit: Payer: Self-pay

## 2021-11-06 ENCOUNTER — Ambulatory Visit: Payer: Medicare PPO | Attending: Student | Admitting: Occupational Therapy

## 2021-11-06 DIAGNOSIS — L905 Scar conditions and fibrosis of skin: Secondary | ICD-10-CM

## 2021-11-06 DIAGNOSIS — M79641 Pain in right hand: Secondary | ICD-10-CM | POA: Diagnosis present

## 2021-11-06 DIAGNOSIS — M6281 Muscle weakness (generalized): Secondary | ICD-10-CM

## 2021-11-06 DIAGNOSIS — M25641 Stiffness of right hand, not elsewhere classified: Secondary | ICD-10-CM

## 2021-11-06 NOTE — Therapy (Signed)
modification necessary   ? Comorbidities Affecting Occupational Performance: None   ? Modification or Assistance to Complete Evaluation  No modification of tasks or assist necessary to complete eval   ? OT Frequency 1x / week   ? OT Duration --   1 wks  ? OT Treatment/Interventions Self-care/ADL training;Paraffin;Ultrasound;Fluidtherapy;Contrast Bath;Therapeutic exercise;Manual Therapy;Patient/family education;Passive range of motion;Scar mobilization;Splinting   ? Consulted and Agree with Plan of Care Patient   ? ?  ?  ? ?  ? ? ?Patient will benefit from skilled therapeutic intervention in order to improve the following deficits and impairments:   ?Body Structure / Function / Physical Skills: ADL, Coordination, FMC, Flexibility, Sensation, Scar mobility, IADL, ROM, Edema, UE functional use, Pain, Dexterity, Strength ?  ?  ? ? ?Visit Diagnosis: ?Scar condition and fibrosis of skin - Plan: Ot plan of care cert/re-cert ? ?Stiffness of right hand, not elsewhere classified - Plan: Ot plan of care cert/re-cert ? ?Pain in right hand - Plan: Ot plan of care cert/re-cert ? ?Muscle weakness (generalized) - Plan: Ot plan of care cert/re-cert ? ? ? ?Problem List ?Patient Active Problem List  ? Diagnosis Date Noted  ? Change in bowel habits 10/26/2020  ? Primary osteoarthritis of left knee 04/30/2018  ? Allergic rhinitis 11/19/2013  ? Esophageal reflux 11/19/2013  ? Other and unspecified hyperlipidemia 11/19/2013  ? ? ?Rosalyn Gess, OTR/L,CLT ?11/06/2021, 1:06 PM ? ?[place] ?[facility] ?[address] ?[place], [place], [address] ?Phone: [phone]   Fax:  [phone] ? ?Name: Patricia Oliver ?MRN: [id number] ?Date of Birth:  05-Oct-1950 ? ?  modification necessary   ? Comorbidities Affecting Occupational Performance: None   ? Modification or Assistance to Complete Evaluation  No modification of tasks or assist necessary to complete eval   ? OT Frequency 1x / week   ? OT Duration --   1 wks  ? OT Treatment/Interventions Self-care/ADL training;Paraffin;Ultrasound;Fluidtherapy;Contrast Bath;Therapeutic exercise;Manual Therapy;Patient/family education;Passive range of motion;Scar mobilization;Splinting   ? Consulted and Agree with Plan of Care Patient   ? ?  ?  ? ?  ? ? ?Patient will benefit from skilled therapeutic intervention in order to improve the following deficits and impairments:   ?Body Structure / Function / Physical Skills: ADL, Coordination, FMC, Flexibility, Sensation, Scar mobility, IADL, ROM, Edema, UE functional use, Pain, Dexterity, Strength ?  ?  ? ? ?Visit Diagnosis: ?Scar condition and fibrosis of skin - Plan: Ot plan of care cert/re-cert ? ?Stiffness of right hand, not elsewhere classified - Plan: Ot plan of care cert/re-cert ? ?Pain in right hand - Plan: Ot plan of care cert/re-cert ? ?Muscle weakness (generalized) - Plan: Ot plan of care cert/re-cert ? ? ? ?Problem List ?Patient Active Problem List  ? Diagnosis Date Noted  ? Change in bowel habits 10/26/2020  ? Primary osteoarthritis of left knee 04/30/2018  ? Allergic rhinitis 11/19/2013  ? Esophageal reflux 11/19/2013  ? Other and unspecified hyperlipidemia 11/19/2013  ? ? ?Rosalyn Gess, OTR/L,CLT ?11/06/2021, 1:06 PM ? ?[place] ?[facility] ?[address] ?[place], [place], [address] ?Phone: [phone]   Fax:  [phone] ? ?Name: Patricia Oliver ?MRN: [id number] ?Date of Birth:  05-Oct-1950 ? ?  [place] ?[place] [facility] ?[address] ?[place], [place], [address] ?Phone: [phone]   Fax:  [phone] ? ?Occupational Therapy Treatment/discharge ? ?Patient Details  ?Name: Patricia Oliver ?MRN: [id number] ?Date of Birth: 1951/03/21 ?Referring Provider (OT): DR Poggi ? ? ?Encounter Date: 11/06/2021 ? ? OT End of Session - 11/06/21 1301   ? ? Visit Number 6   ? Number of Visits 6   ? Date for OT Re-Evaluation 11/06/21   ? OT Start Time 1220   ? OT Stop Time 1240   ? OT Time Calculation (min) 20 min   ? Activity Tolerance Patient tolerated treatment well   ? Behavior During Therapy [facility] for tasks assessed/performed   ? ?  ?  ? ?  ? ? ?Past Medical History:  ?Diagnosis Date  ? Dysrhythmia   ? GERD (gastroesophageal reflux disease)   ? Hyperlipidemia   ? ? ?Past Surgical History:  ?Procedure Laterality Date  ? ABDOMINAL HYSTERECTOMY    ? APPENDECTOMY    ? BREAST BIOPSY Left 1996  ? neg  ? BREAST BIOPSY Left 04/06/2021  ? stereo bx, ribbon clip, path pending  ? COLONOSCOPY N/A 12/10/2014  ? Procedure: COLONOSCOPY;  Surgeon: Lollie Sails, MD;  Location: [facility] ENDOSCOPY;  Service: Endoscopy;  Laterality: N/A;  ? COLONOSCOPY WITH PROPOFOL N/A 11/21/2020  ? Procedure: COLONOSCOPY WITH PROPOFOL;  Surgeon: [name], MD;  Location: [facility] ENDOSCOPY;  Service: Gastroenterology;  Laterality: N/A;  ? EXCISION MASS UPPER EXTREMETIES Right 08/09/2021  ? 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 ORIF ANKLE FRACTURE    ? ? ?There were no vitals filed for this visit. ? ? Subjective Assessment - 11/06/21 1258   ? ? Subjective  I am doing okay seeing Dr. Roland Rack and said the swelling will be there for a little while.  Have some stiffness but no pain except if I hit my finger   ? Pertinent History Pt seen by ortho on 08/09/21 for her first  postop appointment following a excision of soft tissue mass with repair of extensor tendon involving the right index PIP joint. The patient underwent surgery with Dr. Roland Rack on 08/09/2021. 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 Overall the patient feels that she is doing well. She was diagnosed with strep throat and has been taking amoxicillin but she denies any fevers or chills at this time. The patient denies any trauma or injury affecting the right hand since surgery. The patient has been wearing a extension finger splint keeping the PIP joint in extension. The patient denies any numbness or ting to the right upper extremity at this time. The patient is right-hand dominant. She presents today for suture removal.   Refer to OT   ? Patient Stated Goals Want to be able to bend my finger and get my strength back to write, text, grip , squeeze and knit   ? Currently in Pain? No/denies   ? ?  ?  ? ?  ? ? ? ? ? [place] OT Assessment - 11/06/21 0001   ? ?  ? Strength  ? Right Hand Grip (lbs) 40   ? Right Hand Lateral Pinch 14 lbs   ? Right Hand 3 Point Pinch 12 lbs   ? Left

## 2021-11-09 ENCOUNTER — Encounter: Payer: Medicare PPO | Admitting: Occupational Therapy

## 2021-11-14 ENCOUNTER — Other Ambulatory Visit: Payer: Self-pay

## 2021-11-14 MED ORDER — FAMOTIDINE 40 MG PO TABS
ORAL_TABLET | ORAL | 1 refills | Status: DC
  Filled 2021-11-14: qty 180, 90d supply, fill #0
  Filled 2022-05-10: qty 50, 25d supply, fill #1
  Filled 2022-05-10: qty 130, 65d supply, fill #1

## 2021-12-06 ENCOUNTER — Other Ambulatory Visit: Payer: Self-pay

## 2021-12-22 ENCOUNTER — Other Ambulatory Visit: Payer: Self-pay

## 2022-01-03 ENCOUNTER — Other Ambulatory Visit: Payer: Self-pay

## 2022-01-04 ENCOUNTER — Other Ambulatory Visit: Payer: Self-pay

## 2022-02-05 ENCOUNTER — Other Ambulatory Visit: Payer: Self-pay

## 2022-02-05 MED ORDER — CELECOXIB 200 MG PO CAPS
ORAL_CAPSULE | ORAL | 1 refills | Status: DC
  Filled 2022-02-05 – 2022-04-03 (×2): qty 180, 90d supply, fill #0
  Filled 2022-07-08: qty 180, 90d supply, fill #1

## 2022-02-05 MED ORDER — FLUTICASONE PROPIONATE 50 MCG/ACT NA SUSP
NASAL | 1 refills | Status: DC
  Filled 2022-02-05 – 2022-03-07 (×2): qty 48, 90d supply, fill #0
  Filled 2022-06-12: qty 48, 90d supply, fill #1

## 2022-03-07 ENCOUNTER — Other Ambulatory Visit: Payer: Self-pay

## 2022-04-04 ENCOUNTER — Other Ambulatory Visit: Payer: Self-pay

## 2022-05-06 IMAGING — MG MM DIGITAL SCREENING BILAT W/ TOMO AND CAD
8 series · 8 of 24 positions shown · non-contrast
Comparison: Previous exam(s).

CLINICAL DATA: Screening.

EXAM:
DIGITAL SCREENING BILATERAL MAMMOGRAM WITH TOMOSYNTHESIS AND CAD
TECHNIQUE: Bilateral screening digital craniocaudal and mediolateral oblique
mammograms were obtained. Bilateral screening digital breast
tomosynthesis was performed. The images were evaluated with
computer-aided detection.

[L MLO synth-2D]
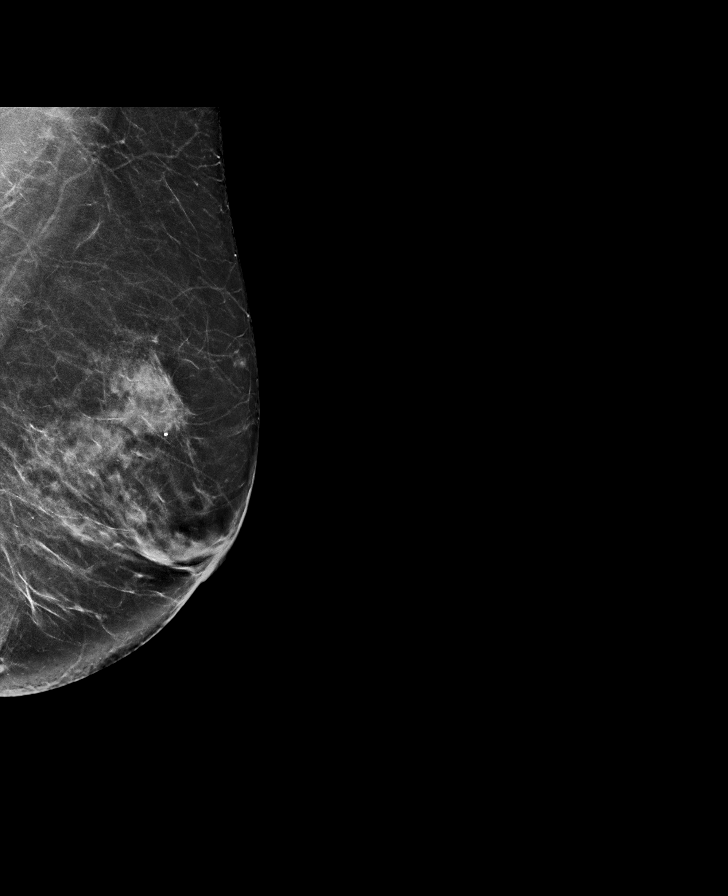

[L CC synth-2D]
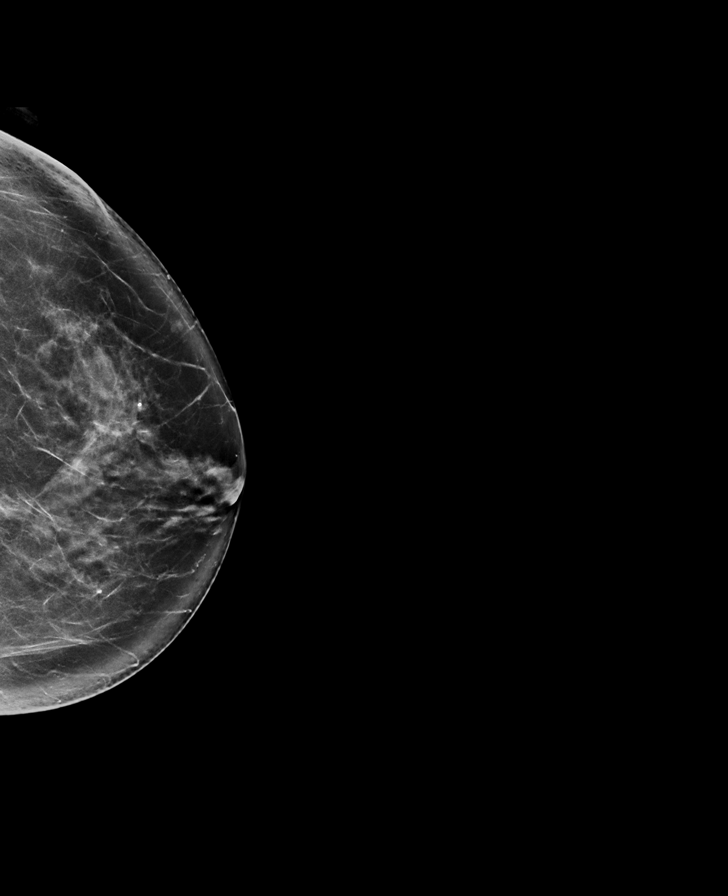

[R CC synth-2D]
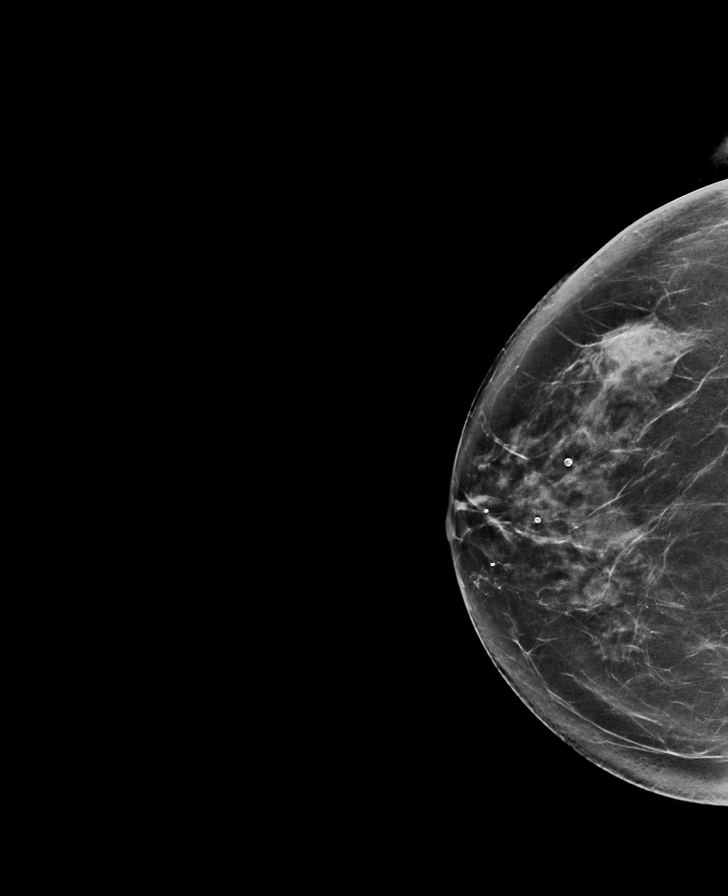

[R MLO synth-2D]
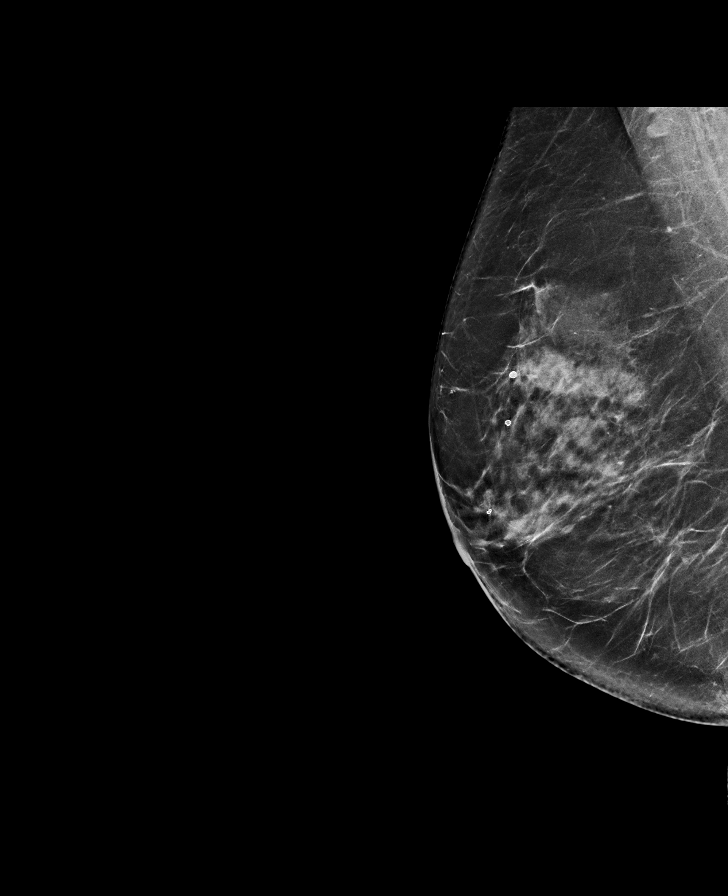

[L MLO tomo · tomo slice 37/72.0]
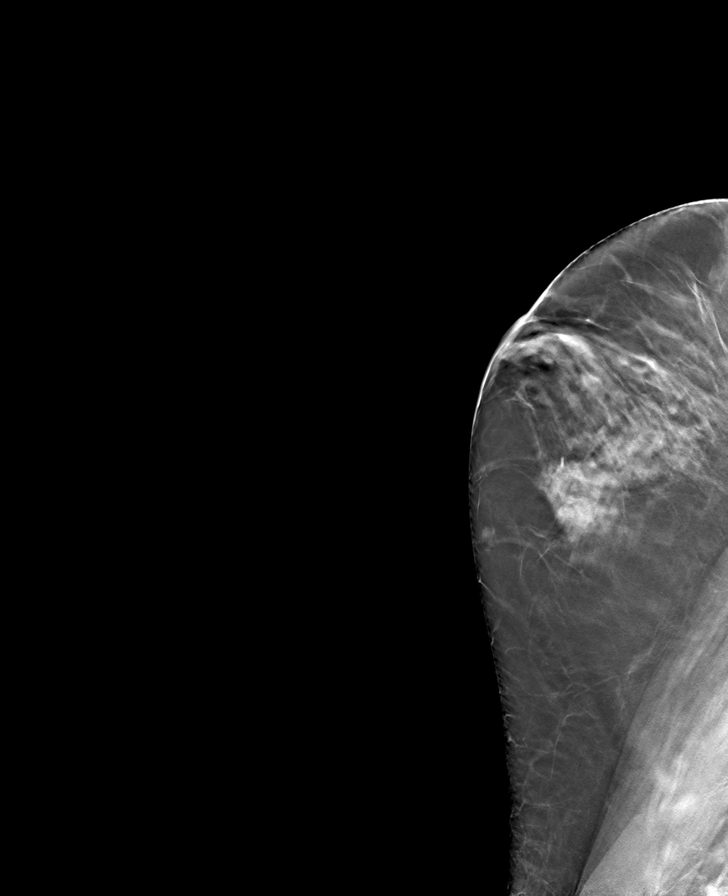

[R MLO tomo · tomo slice 39/76.0]
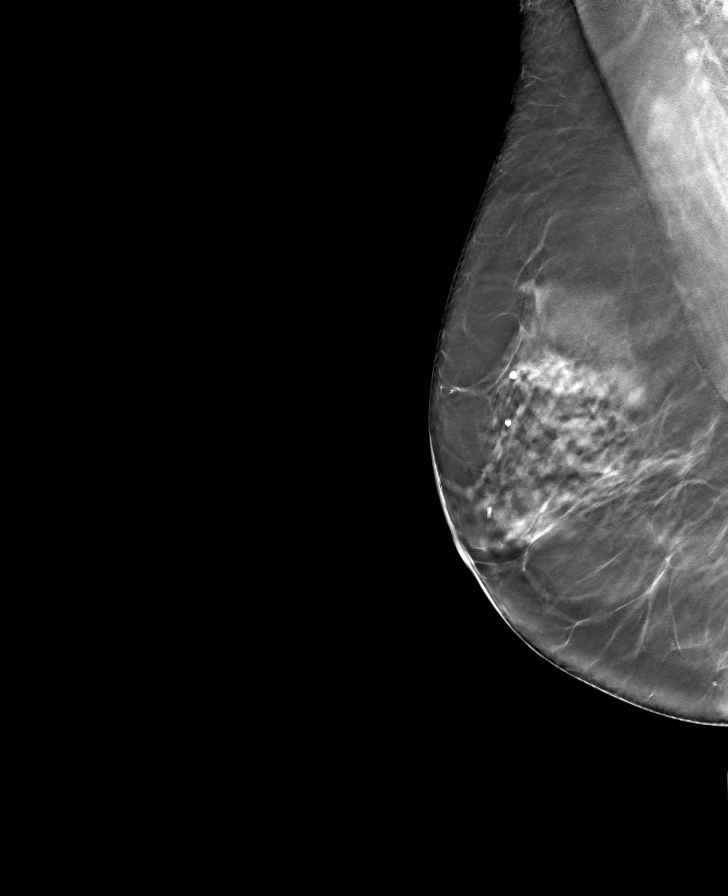

[R CC tomo · tomo slice 41/81.0]
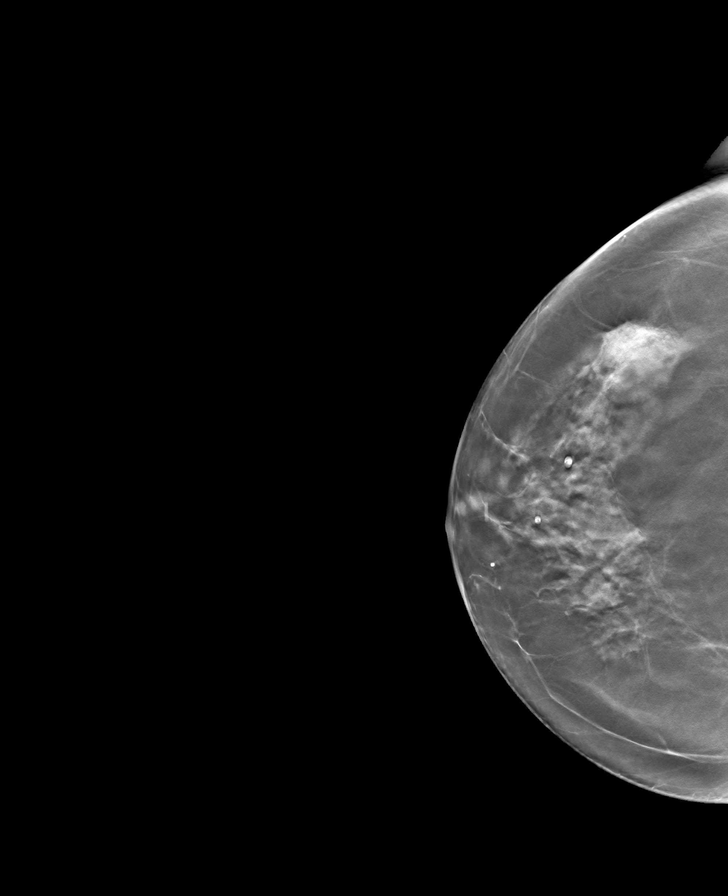

[L CC tomo · tomo slice 39/78.0]
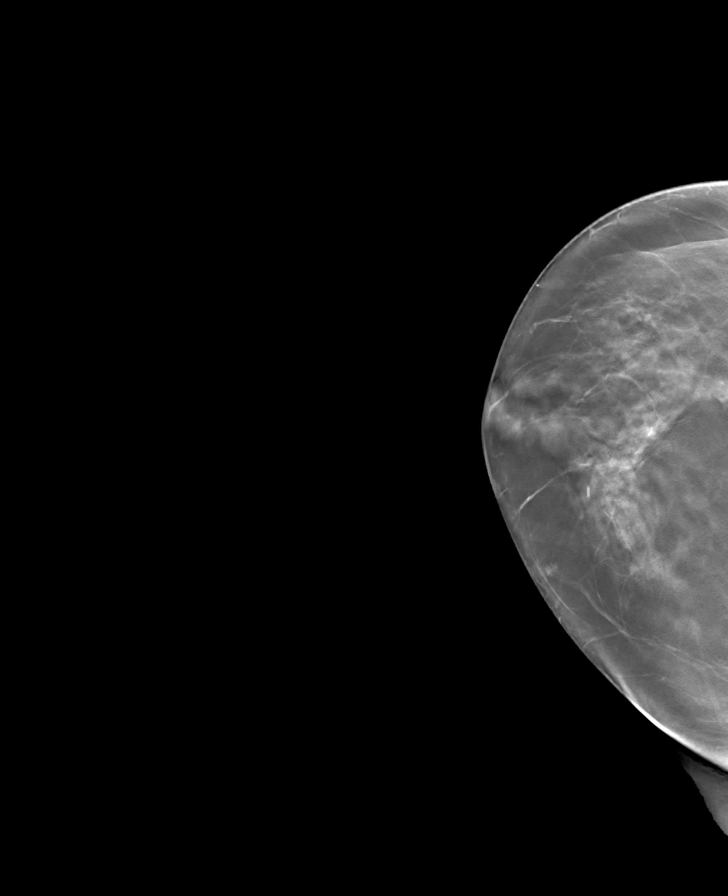

[8 of 24 positions shown; findings below may reference images not displayed]

ACR Breast Density Category c: The breast tissue is heterogeneously
dense, which may obscure small masses.
FINDINGS: There are no findings suspicious for malignancy.
IMPRESSION: No mammographic evidence of malignancy. A result letter of this
screening mammogram will be mailed directly to the patient.

RECOMMENDATION:
Screening mammogram in one year. (Code:Q3-W-BC3)

BI-RADS CATEGORY  1: Negative.

## 2022-05-10 ENCOUNTER — Other Ambulatory Visit: Payer: Self-pay

## 2022-05-10 MED ORDER — AZELASTINE HCL 0.05 % OP SOLN
OPHTHALMIC | 5 refills | Status: DC
  Filled 2022-05-10: qty 6, 30d supply, fill #0
  Filled 2022-06-10: qty 6, 30d supply, fill #1
  Filled 2022-08-01: qty 6, 30d supply, fill #2

## 2022-05-10 MED ORDER — DICYCLOMINE HCL 10 MG PO CAPS
ORAL_CAPSULE | ORAL | 3 refills | Status: DC
  Filled 2022-05-10: qty 360, 90d supply, fill #0

## 2022-05-16 ENCOUNTER — Other Ambulatory Visit: Payer: Self-pay | Admitting: Family Medicine

## 2022-05-16 DIAGNOSIS — Z1231 Encounter for screening mammogram for malignant neoplasm of breast: Secondary | ICD-10-CM

## 2022-05-23 ENCOUNTER — Other Ambulatory Visit: Payer: Self-pay

## 2022-05-23 MED ORDER — COVID-19 MRNA VAC-TRIS(PFIZER) 30 MCG/0.3ML IM SUSY
0.3000 mL | PREFILLED_SYRINGE | Freq: Once | INTRAMUSCULAR | 0 refills | Status: AC
  Filled 2022-05-24: qty 0.3, 1d supply, fill #0

## 2022-05-24 ENCOUNTER — Other Ambulatory Visit: Payer: Self-pay

## 2022-06-10 ENCOUNTER — Other Ambulatory Visit: Payer: Self-pay

## 2022-06-11 ENCOUNTER — Other Ambulatory Visit: Payer: Self-pay

## 2022-06-11 MED ORDER — MONTELUKAST SODIUM 10 MG PO TABS
10.0000 mg | ORAL_TABLET | Freq: Every day | ORAL | 3 refills | Status: DC
  Filled 2022-06-11: qty 90, 90d supply, fill #0
  Filled 2022-09-10: qty 90, 90d supply, fill #1

## 2022-06-12 ENCOUNTER — Other Ambulatory Visit: Payer: Self-pay

## 2022-07-02 ENCOUNTER — Ambulatory Visit
Admission: RE | Admit: 2022-07-02 | Discharge: 2022-07-02 | Disposition: A | Discharge status: Home / Self Care | Payer: Medicare PPO | Source: Ambulatory Visit | Attending: Family Medicine | Admitting: Family Medicine

## 2022-07-02 DIAGNOSIS — Z1231 Encounter for screening mammogram for malignant neoplasm of breast: Secondary | ICD-10-CM | POA: Insufficient documentation

## 2022-07-09 ENCOUNTER — Other Ambulatory Visit: Payer: Self-pay

## 2022-07-19 ENCOUNTER — Other Ambulatory Visit: Payer: Self-pay

## 2022-07-20 ENCOUNTER — Other Ambulatory Visit: Payer: Self-pay

## 2022-08-06 ENCOUNTER — Other Ambulatory Visit: Payer: Self-pay

## 2022-08-06 MED ORDER — SCOPOLAMINE 1 MG/3DAYS TD PT72
1.0000 | MEDICATED_PATCH | TRANSDERMAL | 0 refills | Status: DC
  Filled 2022-08-06: qty 9, 27d supply, fill #0
  Filled 2022-08-06: qty 1, 3d supply, fill #0

## 2022-08-28 ENCOUNTER — Other Ambulatory Visit: Payer: Self-pay

## 2022-08-28 MED ORDER — CEPHALEXIN 250 MG PO CAPS
250.0000 mg | ORAL_CAPSULE | Freq: Two times a day (BID) | ORAL | 0 refills | Status: DC
  Filled 2022-08-28: qty 10, 5d supply, fill #0

## 2022-09-04 ENCOUNTER — Other Ambulatory Visit: Payer: Self-pay

## 2022-09-04 ENCOUNTER — Encounter (INDEPENDENT_AMBULATORY_CARE_PROVIDER_SITE_OTHER): Payer: Medicare PPO | Admitting: Ophthalmology

## 2022-09-04 DIAGNOSIS — H33303 Unspecified retinal break, bilateral: Secondary | ICD-10-CM

## 2022-09-04 DIAGNOSIS — H43813 Vitreous degeneration, bilateral: Secondary | ICD-10-CM | POA: Diagnosis not present

## 2022-09-04 MED ORDER — DOXYCYCLINE HYCLATE 100 MG PO CAPS
100.0000 mg | ORAL_CAPSULE | Freq: Two times a day (BID) | ORAL | 0 refills | Status: DC
  Filled 2022-09-04: qty 10, 5d supply, fill #0

## 2022-09-04 MED ORDER — PREDNISOLONE ACETATE 1 % OP SUSP
OPHTHALMIC | 1 refills | Status: DC
  Filled 2022-09-04: qty 5, 25d supply, fill #0

## 2022-09-10 ENCOUNTER — Other Ambulatory Visit: Payer: Self-pay

## 2022-09-11 ENCOUNTER — Other Ambulatory Visit: Payer: Self-pay

## 2022-09-11 MED ORDER — FLUTICASONE PROPIONATE 50 MCG/ACT NA SUSP
2.0000 | Freq: Every day | NASAL | 1 refills | Status: DC
  Filled 2022-09-11: qty 48, 90d supply, fill #0

## 2022-09-17 ENCOUNTER — Encounter (INDEPENDENT_AMBULATORY_CARE_PROVIDER_SITE_OTHER): Payer: Medicare PPO | Admitting: Ophthalmology

## 2022-09-19 ENCOUNTER — Encounter (INDEPENDENT_AMBULATORY_CARE_PROVIDER_SITE_OTHER): Payer: Medicare PPO | Admitting: Ophthalmology

## 2022-09-19 DIAGNOSIS — H33303 Unspecified retinal break, bilateral: Secondary | ICD-10-CM | POA: Diagnosis not present

## 2022-09-19 DIAGNOSIS — H43813 Vitreous degeneration, bilateral: Secondary | ICD-10-CM

## 2022-09-26 ENCOUNTER — Other Ambulatory Visit: Payer: Self-pay

## 2022-09-26 MED ORDER — NITROFURANTOIN MONOHYD MACRO 100 MG PO CAPS
100.0000 mg | ORAL_CAPSULE | Freq: Two times a day (BID) | ORAL | 0 refills | Status: DC
  Filled 2022-09-26: qty 10, 5d supply, fill #0

## 2022-10-08 ENCOUNTER — Other Ambulatory Visit: Payer: Self-pay

## 2022-10-08 MED ORDER — CELECOXIB 200 MG PO CAPS
200.0000 mg | ORAL_CAPSULE | Freq: Two times a day (BID) | ORAL | 1 refills | Status: DC
  Filled 2022-10-08: qty 180, 90d supply, fill #0
  Filled 2023-02-11: qty 180, 90d supply, fill #1

## 2022-10-20 ENCOUNTER — Ambulatory Visit
Admission: EM | Admit: 2022-10-20 | Discharge: 2022-10-20 | Disposition: A | Discharge status: Home / Self Care | Payer: Medicare PPO | Attending: Urgent Care | Admitting: Urgent Care

## 2022-10-20 DIAGNOSIS — R1032 Left lower quadrant pain: Secondary | ICD-10-CM

## 2022-10-20 LAB — POCT URINALYSIS DIP (MANUAL ENTRY)
Bilirubin, UA: NEGATIVE
Glucose, UA: NEGATIVE mg/dL
Ketones, POC UA: NEGATIVE mg/dL
Leukocytes, UA: NEGATIVE
Nitrite, UA: NEGATIVE
Protein Ur, POC: NEGATIVE mg/dL
Spec Grav, UA: 1.02 (ref 1.010–1.025)
Urobilinogen, UA: 0.2 E.U./dL
pH, UA: 6 (ref 5.0–8.0)

## 2022-10-20 NOTE — Discharge Instructions (Signed)
Recommend follow-up with your PCP or a gastroenterologist for both the symptoms that you are experiencing today as well as you are recently incidental results after your PCP visit.  I would suggest a viral hepatitis panel as well as imaging, likely starting with ultrasound to evaluate your liver.  You may need imaging of your lower abdomen as well.  If your symptoms of pain become intolerable, please seek evaluation at the ED.

## 2022-10-20 NOTE — ED Triage Notes (Signed)
Patient presents to UC for intermittent LLQ pain since 10 days. States she was seen at her PCP, c diff was ruled out, labs were obtained and liver enzymes were elevated. Hx of diverticulitis. Took a dose of tylenol.   Denies fever, n/v.

## 2022-10-20 NOTE — ED Provider Notes (Addendum)
Roderic Palau    CSN: [id number] Arrival date & time: 10/20/22  [id number]      History   Chief Complaint Chief Complaint  Patient presents with   Abdominal Pain    HPI ERLEAN Oliver is a 72 y.o. female.    Abdominal Pain   Patient presents to urgent care with intermittent left lower quadrant pain x 10 days.   She endorses a history of "diverticulitis" after a colonoscopy 2 years ago.  Review of her colonoscopy notes shows "multiple small mouth diverticula" in the sigmoid colon.  Patient was seen at her PCP on 10/11/2022 for similar symptoms and evaluated with blood work and stool.  She states that the blood work showed elevated liver enzymes and there were concern for hepatitis.  She states C. difficile was ruled out by a "blood test".  She states hepatitis was also ruled out "by a stool test".  Visit note show orders for CBC with differential, CMP, UA, C. difficile toxin gene NAA.  Liver enzymes are significantly elevated. Add-on for viral hep panel with negative results.  She endorses "normal" bowel movements.  She states she generally has a "small" bowel movement that is hard first thing in the morning because she is on iron pills then has a more substantial bowel movement later.  She takes Bentyl for abdominal cramping.  Recently treated with multiple antibiotics for a UTI.  Past Medical History:  Diagnosis Date   Dysrhythmia    GERD (gastroesophageal reflux disease)    Hyperlipidemia     Patient Active Problem List   Diagnosis Date Noted   Change in bowel habits 10/26/2020   Primary osteoarthritis of left knee 04/30/2018   Allergic rhinitis 11/19/2013   Esophageal reflux 11/19/2013   Other and unspecified hyperlipidemia 11/19/2013    Past Surgical History:  Procedure Laterality Date   ABDOMINAL HYSTERECTOMY     APPENDECTOMY     BREAST BIOPSY Left 1996   neg   BREAST BIOPSY Left 04/06/2021   stereo bx, ribbon clip, neg   COLONOSCOPY N/A 12/10/2014    Procedure: COLONOSCOPY;  Surgeon: Lollie Sails, MD;  Location: [facility] ENDOSCOPY;  Service: Endoscopy;  Laterality: N/A;   COLONOSCOPY WITH PROPOFOL N/A 11/21/2020   Procedure: COLONOSCOPY WITH PROPOFOL;  Surgeon: [name], MD;  Location: [facility] ENDOSCOPY;  Service: Gastroenterology;  Laterality: N/A;   EXCISION MASS UPPER EXTREMETIES Right 08/09/2021   Procedure: 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      Home Medications    Prior to Admission medications   Medication Sig Start Date End Date Taking? Authorizing Provider  Ascorbic Acid (VITAMIN C) 100 MG tablet Take 400 mg by mouth daily.    [provider]  azelastine (ASTELIN) 0.1 % nasal spray PLACE 1 SPRAY INTO BOTH NOSTRILS ONCE DAILY 05/25/21     azelastine (OPTIVAR) 0.05 % ophthalmic solution Place 1 drop into both eyes once daily 05/10/22     Calcium Citrate-Vitamin D (CITRACAL + D PO) Take by mouth.    [provider]  celecoxib (CELEBREX) 200 MG capsule Take by mouth. 10/14/15   [provider]  celecoxib (CELEBREX) 200 MG capsule Take 1 capsule (200 mg total) by mouth 2 (two) times daily. 10/08/22  cephALEXin (KEFLEX) 250 MG capsule Take 1 capsule (250 mg total) by mouth 2 (two) times daily. 08/28/22     dicyclomine (BENTYL) 10 MG capsule Take 1 capsule (10 mg total) by mouth in the morning and at bedtime. 08/09/21   Poggi, Marshall Cork, MD  dicyclomine (BENTYL) 10 MG capsule Take 1 capsule (10 mg total) by mouth 4 (four) times daily before meals and nightly 05/10/22     doxycycline (VIBRAMYCIN) 100 MG capsule Take 1 capsule (100 mg total) by mouth 2 (two) times daily for 5 days. 09/04/22     famotidine (PEPCID) 40 MG tablet Take 1 tablet (40 mg total) by mouth daily. 08/09/21   Poggi, Marshall Cork, MD   famotidine (PEPCID) 40 MG tablet Take 1 tablet (40 mg total) by mouth 2 (two) times daily as needed for Heartburn 11/14/21     fluticasone (FLONASE) 50 MCG/ACT nasal spray Place 2 sprays into both nostrils daily. 09/11/22     glucosamine-chondroitin 500-400 MG tablet Take 1 tablet by mouth daily.    [provider]  guaifenesin (HUMIBID E) 400 MG TABS tablet Take 400 mg by mouth daily.    [provider]  HYDROcodone-acetaminophen (NORCO) 5-325 MG tablet Take 1-2 tablets by mouth every 6 (six) hours as needed for moderate pain. MAXIMUM TOTAL ACETAMINOPHEN DOSE IS 4000 MG PER DAY 08/09/21   Poggi, Marshall Cork, MD  HYDROcodone-acetaminophen (NORCO/VICODIN) 5-325 MG tablet Take 1 to 2 tablets by mouth every 6 hours as needed for moderate pain. 08/09/21     lidocaine (XYLOCAINE) 2 % solution 5 mL swish and swallow every 4 hours as needed for sore throat 08/11/21   [name], PA-C  montelukast (SINGULAIR) 10 MG tablet TAKE 1 TABLET BY MOUTH ONCE DAILY 06/12/21     montelukast (SINGULAIR) 10 MG tablet TAKE 1 TABLET BY MOUTH ONCE DAILY 06/11/22     nitrofurantoin, macrocrystal-monohydrate, (MACROBID) 100 MG capsule Take 1 capsule (100 mg total) by mouth 2 (two) times daily for 5 days. 09/26/22     ondansetron (ZOFRAN-ODT) 4 MG disintegrating tablet Dissolve 1 tablet (4 mg total) by mouth every 8 (eight) hours as needed for nausea or vomiting. 08/09/21   Poggi, Marshall Cork, MD  prednisoLONE acetate (PRED FORTE) 1 % ophthalmic suspension INSTILL ONE DROP INTO RIGHT EYE 4 TIMES A DAY FOR 1 WEEK AND DISCONTINUE 09/04/22     scopolamine (TRANSDERM-SCOP) 1 MG/3DAYS Place 1 patch (1.5 mg total) onto the skin every 3 (three) days. 08/06/22     simvastatin (ZOCOR) 40 MG tablet Take 1 tablet (40 mg total) by mouth once daily Patient taking differently: Take 20 mg by mouth daily. 02/27/21     simvastatin (ZOCOR) 40 MG tablet Take 1 tablet (40 mg total) by mouth once daily 08/21/21     vitamin E 400 UNIT  capsule Take 400 Units by mouth daily.    [provider]  zolpidem (AMBIEN) 5 MG tablet TAKE 1 TABLET BY MOUTH NIGHTLY AS NEEDED FOR SLEEP 07/01/20 08/01/21  Derinda Late, MD    Family History Family History  Problem Relation Age of Onset   Breast cancer Sister 55   Breast cancer Paternal 70    Breast cancer Paternal Aunt    Breast cancer Sister 27       dx twice with breast ca    Social History Social History   Tobacco Use   Smoking status: Never   Smokeless tobacco: Never  Vaping Use   Vaping Use:  Never used  Substance Use Topics   Alcohol use: Not Currently     Allergies   Morphine and related, Ciprofloxacin, and Elemental sulfur   Review of Systems Review of Systems  Gastrointestinal:  Positive for abdominal pain.    Physical Exam Triage Vital Signs ED Triage Vitals  Enc Vitals Group     BP 10/20/22 0845 (!) 145/86     Pulse Rate 10/20/22 0845 (!) 102     Resp 10/20/22 0845 16     Temp 10/20/22 0845 99.4 F (37.4 C)     Temp Source 10/20/22 0845 Oral     SpO2 10/20/22 0845 95 %     Weight --      Height --      Head Circumference --      Peak Flow --      Pain Score 10/20/22 0843 7     Pain Loc --      Pain Edu? --      Excl. in [place]? --    No data found.  Updated Vital Signs BP (!) 145/86 (BP Location: Left Arm)   Pulse (!) 102   Temp 99.4 F (37.4 C) (Oral)   Resp 16   SpO2 95%   Visual Acuity Right Eye Distance:   Left Eye Distance:   Bilateral Distance:    Right Eye Near:   Left Eye Near:    Bilateral Near:     Physical Exam Vitals reviewed.  Constitutional:      Appearance: She is well-developed.  Abdominal:     General: Abdomen is flat. Bowel sounds are decreased.     Palpations: Abdomen is soft.     Tenderness: There is abdominal tenderness. There is no guarding. Negative signs include Murphy's sign.     Comments: Moderate lower left quadrant discomfort with palpation without guarding.  Mild right lower quadrant  discomfort.  Suprapubic discomfort with palpation.  Skin:    General: Skin is warm and dry.  Neurological:     General: No focal deficit present.     Mental Status: She is alert and oriented to person, place, and time.  Psychiatric:        Mood and Affect: Mood normal.        Behavior: Behavior normal.      UC Treatments / Results  Labs (all labs ordered are listed, but only abnormal results are displayed) Labs Reviewed - No data to display  EKG   Radiology No results found.  Procedures Procedures (including critical care time)  Medications Ordered in UC Medications - No data to display  Initial Impression / Assessment and Plan / UC Course  I have reviewed the triage vital signs and the nursing notes.  Pertinent labs & imaging results that were available during my care of the patient were reviewed by me and considered in my medical decision making (see chart for details).   Differential diagnoses considered include diverticulitis (patient's chart is more consistent with a history of diverticulosis), IBS, UTI.  UA results are not consistent with a UTI, though there is blood in her urine.  She does have lower left quadrant tenderness with a history of sigmoid diverticula.  Recommended evaluation with imaging.  Patient expresses an understandable reluctance to go to the ED unless necessary on this holiday weekend.  Recommended that she follow-up with her PCP or gastroenterologist for further evaluation unless her discomfort becomes severe or develop additional symptoms including fever.  Also suggested that she follow-up  regarding her recent incidental liver findings.  Patient acknowledges understanding and agreement with this treatment plan.   Final Clinical Impressions(s) / UC Diagnoses   Final diagnoses:  None   Discharge Instructions   None    ED Prescriptions   None    PDMP not reviewed this encounter.   Rose Phi, FNP 10/20/22 1000    ImmordinoAnnie Main, FNP 10/20/22 1019

## 2022-10-22 ENCOUNTER — Telehealth: Payer: Self-pay

## 2022-10-22 DIAGNOSIS — K5792 Diverticulitis of intestine, part unspecified, without perforation or abscess without bleeding: Secondary | ICD-10-CM

## 2022-10-22 HISTORY — DX: Diverticulitis of intestine, part unspecified, without perforation or abscess without bleeding: K57.92

## 2022-10-22 NOTE — Telephone Encounter (Signed)
Patient called back and I was able to add her to the schedule to be seen until 10/25/2022 since Dr. Vicente Males doesn't really have appointments available until mid June. Patient was not too happy to hear but accepted the date that was given. She was also told that we would put her in on our wait list just in case of any cancellations.

## 2022-10-22 NOTE — Telephone Encounter (Signed)
Patient left a voicemail on Friday stating she is having sever abdominal cramping. She went to PCP last week and her lFT were elevated.

## 2022-10-25 ENCOUNTER — Ambulatory Visit: Payer: Medicare PPO | Admitting: Gastroenterology

## 2022-10-25 ENCOUNTER — Other Ambulatory Visit: Payer: Self-pay

## 2022-10-25 ENCOUNTER — Encounter: Payer: Self-pay | Admitting: Gastroenterology

## 2022-10-25 VITALS — BP 134/90 | HR 88 | Temp 98.1°F | Ht 64.0 in | Wt 145.0 lb

## 2022-10-25 DIAGNOSIS — R1032 Left lower quadrant pain: Secondary | ICD-10-CM

## 2022-10-25 DIAGNOSIS — R7989 Other specified abnormal findings of blood chemistry: Secondary | ICD-10-CM

## 2022-10-25 NOTE — Patient Instructions (Signed)
Please arrive to the Outpatient imaging at 11:00 AM for your CT Scan.

## 2022-10-25 NOTE — Addendum Note (Signed)
Addended by: Wayna Chalet on: 10/25/2022 03:29 PM   Modules accepted: Orders

## 2022-10-25 NOTE — Progress Notes (Signed)
Patricia Bellows MD, MRCP(U.K) [address]  [place]  [place], [place] [address]  Main: [phone]  Fax: [phone]   Primary Care Physician: Derinda Late, MD  Primary Gastroenterologist:  Dr. Jonathon Oliver   Chief Complaint  Patient presents with   LL Abdominal pain    HPI: Patricia Oliver is a 72 y.o. female Summary of history :   Initially referred and seen on 10/26/2020 for diarrhea  3-4 times a day.  Usually in the morning after she wakes up.  Was consuming sweet tea half a cup of artificial sugar Stevia in 1 gallon daily.  Associated with a lot of gas.  Taking Celebrex 1 tablet daily for many years.  11/21/2020: Colonoscopy: 3 polyps resected in the ascending and sigmoid colon.  Random colon biopsies showed no evidence of microscopic colitis.  The polyps were sessile serrated polyps x2.  in 2022 she had stopped taking her Celebrex, artificial sugars and has noted a significant improvement in her symptoms    Interval history   01/03/2021-10/25/2022  Since her last visit she has stopped taking Celebrex with no joint pains.  She has stopped all the artificial sugars in the diet.  Still has some change in the consistency of her stools each morning she states that it is not diarrhea but just a change in the shape.  She was having gas and bloating issues previously but not having them right now.  Takes Bentyl twice a day.  Which she said he thinks helps her   On 10/20/2022 seen at the emergency room/urgent care for left lower quadrant pain.  No imaging was done.  Hemoglobin was 12.7 g with an MCV of 79 urine analysis showed rare bacteria but trace of blood.  There is elevation of AST ALT and alkaline phosphatase total bilirubin was normal. She was given a wide differentials and asked to follow-up with GI.  I do not see any particular therapy administered  He says that a few weeks back she had urinary tract infections and was given 3 types of antibiotics.  Denies any excess alcohol  use no other new medications for the past 2 weeks has had left lower quadrant pain denies any diarrhea no blood in the stool the pain has not improved and hence is here today to see me.  Denies any fevers.  Current Outpatient Medications  Medication Sig Dispense Refill   Ascorbic Acid (VITAMIN C) 100 MG tablet Take 400 mg by mouth daily.     azelastine (ASTELIN) 0.1 % nasal spray PLACE 1 SPRAY INTO BOTH NOSTRILS ONCE DAILY 90 mL 3   azelastine (OPTIVAR) 0.05 % ophthalmic solution Place 1 drop into both eyes once daily 6 mL 5   Calcium Citrate-Vitamin D (CITRACAL + D PO) Take by mouth.     celecoxib (CELEBREX) 200 MG capsule Take by mouth.     celecoxib (CELEBREX) 200 MG capsule Take 1 capsule (200 mg total) by mouth 2 (two) times daily. 180 capsule 1   dicyclomine (BENTYL) 10 MG capsule Take 1 capsule (10 mg total) by mouth 4 (four) times daily before meals and nightly 360 capsule 3   famotidine (PEPCID) 40 MG tablet Take 1 tablet (40 mg total) by mouth 2 (two) times daily as needed for Heartburn 180 tablet 1   fluticasone (FLONASE) 50 MCG/ACT nasal spray Place 2 sprays into both nostrils daily. 48 g 1   glucosamine-chondroitin 500-400 MG tablet Take 1 tablet by mouth daily.  guaifenesin (HUMIBID E) 400 MG TABS tablet Take 400 mg by mouth daily.     Inulin 2 g CHEW Chew by mouth.     montelukast (SINGULAIR) 10 MG tablet TAKE 1 TABLET BY MOUTH ONCE DAILY 90 tablet 3   montelukast (SINGULAIR) 10 MG tablet TAKE 1 TABLET BY MOUTH ONCE DAILY 90 tablet 3   nitrofurantoin, macrocrystal-monohydrate, (MACROBID) 100 MG capsule Take 1 capsule (100 mg total) by mouth 2 (two) times daily for 5 days. 10 capsule 0   simvastatin (ZOCOR) 40 MG tablet Take 1 tablet (40 mg total) by mouth once daily (Patient taking differently: Take 20 mg by mouth daily.) 90 tablet 3   vitamin E 400 UNIT capsule Take 400 Units by mouth daily.     zolpidem (AMBIEN) 5 MG tablet TAKE 1 TABLET BY MOUTH NIGHTLY AS NEEDED FOR SLEEP  30 tablet 0   No current facility-administered medications for this visit.    Allergies as of 10/25/2022 - Review Complete 10/25/2022  Allergen Reaction Noted   Morphine and related Nausea And Vomiting 12/09/2014   Ciprofloxacin Swelling and Rash 12/01/2014   Elemental sulfur Itching, Swelling, and Rash 12/01/2014    ROS:  General: Negative for anorexia, weight loss, fever, chills, fatigue, weakness. ENT: Negative for hoarseness, difficulty swallowing , nasal congestion. CV: Negative for chest pain, angina, palpitations, dyspnea on exertion, peripheral edema.  Respiratory: Negative for dyspnea at rest, dyspnea on exertion, cough, sputum, wheezing.  GI: See history of present illness. GU:  Negative for dysuria, hematuria, urinary incontinence, urinary frequency, nocturnal urination.  Endo: Negative for unusual weight change.    Physical Examination:   BP (!) 134/90   Pulse 88   Temp 98.1 F (36.7 C) (Oral)   Ht 5\' 4"  (1.626 m)   Wt 145 lb (65.8 kg)   BMI 24.89 kg/m   General: Well-nourished, well-developed in no acute distress.  Eyes: No icterus. Conjunctivae pink. Mouth: Oropharyngeal mucosa moist and pink , no lesions erythema or exudate. Abdomen: Mild tenderness left lower quadrant Extremities: No lower extremity edema. No clubbing or deformities. Neuro: Alert and oriented x 3.  Grossly intact. Skin: Warm and dry, no jaundice.   Psych: Alert and cooperative, normal mood and affect.   Imaging Studies: No results found.  Assessment and Plan:   Patricia Oliver is a 72 y.o. y/o female with a history of lower GI symptoms, diarrhea likely due to a combination of osmotic diarrhea from consuming large quantity of sugars, long-term use of Celebrex and possibly irritable bowel syndrome with diarrhea.  Today she is here to see me for abnormal liver function tests mild elevation in transaminases my suspicion is related to antibiotic use from recent UTI.  She also has left lower  quadrant pain which is suspicious for diverticulitis unfortunately it has not resolved in 2 weeks hence I would like to perform imaging of the abdomen to rule out an abscess and determine course of treatment.  If her LFTs are abnormal and not improving then will require full autoimmune and viral hepatitis workup/ultrasound of the liver      Dr Patricia Bellows  MD,MRCP [facility]) Follow up in 1 week video visit

## 2022-10-26 ENCOUNTER — Inpatient Hospital Stay
Admission: EM | Admit: 2022-10-26 | Discharge: 2022-10-29 | DRG: 392 | Disposition: A | Discharge status: Home / Self Care | Payer: Medicare PPO | Attending: Internal Medicine | Admitting: Internal Medicine

## 2022-10-26 ENCOUNTER — Encounter: Payer: Self-pay | Admitting: Internal Medicine

## 2022-10-26 ENCOUNTER — Inpatient Hospital Stay: Admission: RE | Admit: 2022-10-26 | Payer: Medicare PPO | Source: Ambulatory Visit

## 2022-10-26 ENCOUNTER — Telehealth: Payer: Self-pay

## 2022-10-26 ENCOUNTER — Other Ambulatory Visit: Payer: Self-pay

## 2022-10-26 ENCOUNTER — Ambulatory Visit
Admission: RE | Admit: 2022-10-26 | Discharge: 2022-10-26 | Disposition: A | Discharge status: Home / Self Care | Payer: Medicare PPO | Source: Ambulatory Visit | Attending: Gastroenterology | Admitting: Gastroenterology

## 2022-10-26 DIAGNOSIS — R748 Abnormal levels of other serum enzymes: Secondary | ICD-10-CM | POA: Diagnosis present

## 2022-10-26 DIAGNOSIS — Z803 Family history of malignant neoplasm of breast: Secondary | ICD-10-CM | POA: Diagnosis not present

## 2022-10-26 DIAGNOSIS — M1712 Unilateral primary osteoarthritis, left knee: Secondary | ICD-10-CM | POA: Diagnosis present

## 2022-10-26 DIAGNOSIS — Z79899 Other long term (current) drug therapy: Secondary | ICD-10-CM | POA: Diagnosis not present

## 2022-10-26 DIAGNOSIS — G4700 Insomnia, unspecified: Secondary | ICD-10-CM | POA: Diagnosis present

## 2022-10-26 DIAGNOSIS — R7989 Other specified abnormal findings of blood chemistry: Secondary | ICD-10-CM

## 2022-10-26 DIAGNOSIS — E785 Hyperlipidemia, unspecified: Secondary | ICD-10-CM | POA: Diagnosis present

## 2022-10-26 DIAGNOSIS — K219 Gastro-esophageal reflux disease without esophagitis: Secondary | ICD-10-CM | POA: Diagnosis present

## 2022-10-26 DIAGNOSIS — Z888 Allergy status to other drugs, medicaments and biological substances status: Secondary | ICD-10-CM | POA: Diagnosis not present

## 2022-10-26 DIAGNOSIS — Z791 Long term (current) use of non-steroidal anti-inflammatories (NSAID): Secondary | ICD-10-CM

## 2022-10-26 DIAGNOSIS — Z90710 Acquired absence of both cervix and uterus: Secondary | ICD-10-CM

## 2022-10-26 DIAGNOSIS — K5792 Diverticulitis of intestine, part unspecified, without perforation or abscess without bleeding: Secondary | ICD-10-CM | POA: Diagnosis present

## 2022-10-26 DIAGNOSIS — R1032 Left lower quadrant pain: Secondary | ICD-10-CM

## 2022-10-26 DIAGNOSIS — J309 Allergic rhinitis, unspecified: Secondary | ICD-10-CM | POA: Diagnosis present

## 2022-10-26 DIAGNOSIS — R109 Unspecified abdominal pain: Secondary | ICD-10-CM | POA: Diagnosis present

## 2022-10-26 DIAGNOSIS — Z881 Allergy status to other antibiotic agents status: Secondary | ICD-10-CM

## 2022-10-26 DIAGNOSIS — K5720 Diverticulitis of large intestine with perforation and abscess without bleeding: Principal | ICD-10-CM | POA: Diagnosis present

## 2022-10-26 LAB — COMPREHENSIVE METABOLIC PANEL
ALT: 65 IU/L — ABNORMAL HIGH (ref 0–32)
ALT: 54 U/L — ABNORMAL HIGH (ref 0–44)
AST: 43 IU/L — ABNORMAL HIGH (ref 0–40)
AST: 28 U/L (ref 15–41)
Albumin/Globulin Ratio: 1.3 (ref 1.2–2.2)
Albumin: 4 g/dL (ref 3.8–4.8)
Albumin: 4 g/dL (ref 3.5–5.0)
Alkaline Phosphatase: 159 IU/L — ABNORMAL HIGH (ref 44–121)
Alkaline Phosphatase: 132 U/L — ABNORMAL HIGH (ref 38–126)
Anion gap: 12 (ref 5–15)
BUN/Creatinine Ratio: 21 (ref 12–28)
BUN: 18 mg/dL (ref 8–27)
BUN: 13 mg/dL (ref 8–23)
Bilirubin Total: <0.2 mg/dL (ref 0.0–1.2)
CO2: 21 mmol/L (ref 20–29)
CO2: 27 mmol/L (ref 22–32)
Calcium: 9.9 mg/dL (ref 8.7–10.3)
Calcium: 10.6 mg/dL — ABNORMAL HIGH (ref 8.9–10.3)
Chloride: 109 mmol/L — ABNORMAL HIGH (ref 96–106)
Chloride: 98 mmol/L (ref 98–111)
Creatinine, Ser: 0.85 mg/dL (ref 0.57–1.00)
Creatinine, Ser: 0.7 mg/dL (ref 0.44–1.00)
GFR, Estimated: >60 mL/min (ref >60)
Globulin, Total: 3 g/dL (ref 1.5–4.5)
Glucose, Bld: 92 mg/dL (ref 70–99)
Glucose: 106 mg/dL — ABNORMAL HIGH (ref 70–99)
Potassium: 4.2 mmol/L (ref 3.5–5.2)
Potassium: 3.5 mmol/L (ref 3.5–5.1)
Sodium: 145 mmol/L — ABNORMAL HIGH (ref 134–144)
Sodium: 137 mmol/L (ref 135–145)
Total Bilirubin: 0.6 mg/dL (ref 0.3–1.2)
Total Protein: 7 g/dL (ref 6.0–8.5)
Total Protein: 8.1 g/dL (ref 6.5–8.1)
eGFR: 73 mL/min/{1.73_m2} (ref >59)

## 2022-10-26 LAB — CBC WITH DIFFERENTIAL/PLATELET
Abs Immature Granulocytes: 0.04 10*3/uL (ref 0.00–0.07)
Basophils Absolute: 0 10*3/uL (ref 0.0–0.2)
Basophils Absolute: 0 10*3/uL (ref 0.0–0.1)
Basophils Relative: 0 %
Basos: 0 %
EOS (ABSOLUTE): 0.1 10*3/uL (ref 0.0–0.4)
Eos: 2 %
Eosinophils Absolute: 0.1 10*3/uL (ref 0.0–0.5)
Eosinophils Relative: 1 %
HCT: 44.8 % (ref 36.0–46.0)
Hematocrit: 44.2 % (ref 34.0–46.6)
Hemoglobin: 13.5 g/dL (ref 11.1–15.9)
Hemoglobin: 13.8 g/dL (ref 12.0–15.0)
Immature Grans (Abs): 0 10*3/uL (ref 0.0–0.1)
Immature Granulocytes: 0 %
Immature Granulocytes: 1 %
Lymphocytes Absolute: 1.3 10*3/uL (ref 0.7–3.1)
Lymphocytes Relative: 19 %
Lymphs Abs: 1.5 10*3/uL (ref 0.7–4.0)
Lymphs: 18 %
MCH: 24.2 pg — ABNORMAL LOW (ref 26.6–33.0)
MCH: 23.8 pg — ABNORMAL LOW (ref 26.0–34.0)
MCHC: 30.5 g/dL — ABNORMAL LOW (ref 31.5–35.7)
MCHC: 30.8 g/dL (ref 30.0–36.0)
MCV: 79 fL (ref 79–97)
MCV: 77.4 fL — ABNORMAL LOW (ref 80.0–100.0)
Monocytes Absolute: 0.5 10*3/uL (ref 0.1–0.9)
Monocytes Absolute: 0.5 10*3/uL (ref 0.1–1.0)
Monocytes Relative: 7 %
Monocytes: 7 %
Neutro Abs: 5.4 10*3/uL (ref 1.7–7.7)
Neutrophils Absolute: 5.3 10*3/uL (ref 1.4–7.0)
Neutrophils Relative %: 72 %
Neutrophils: 73 %
Platelets: 340 10*3/uL (ref 150–450)
Platelets: 351 10*3/uL (ref 150–400)
RBC: 5.58 x10E6/uL — ABNORMAL HIGH (ref 3.77–5.28)
RBC: 5.79 MIL/uL — ABNORMAL HIGH (ref 3.87–5.11)
RDW: 17.1 % — ABNORMAL HIGH (ref 11.7–15.4)
RDW: 18.4 % — ABNORMAL HIGH (ref 11.5–15.5)
WBC: 7.2 10*3/uL (ref 3.4–10.8)
WBC: 7.5 10*3/uL (ref 4.0–10.5)
nRBC: 0 % (ref 0.0–0.2)

## 2022-10-26 LAB — URINALYSIS, ROUTINE W REFLEX MICROSCOPIC
Bilirubin Urine: NEGATIVE
Glucose, UA: NEGATIVE mg/dL
Hgb urine dipstick: NEGATIVE
Ketones, ur: NEGATIVE mg/dL
Leukocytes,Ua: NEGATIVE
Nitrite: NEGATIVE
Protein, ur: NEGATIVE mg/dL
Specific Gravity, Urine: >1.046 — ABNORMAL HIGH (ref 1.005–1.030)
pH: 6 (ref 5.0–8.0)

## 2022-10-26 MED ORDER — SODIUM CHLORIDE 0.9 % IV SOLN: INTRAVENOUS | Status: AC

## 2022-10-26 MED ORDER — IOHEXOL 300 MG/ML  SOLN
100.0000 mL | Freq: Once | INTRAMUSCULAR | Status: AC | PRN
  Administered 2022-10-26: 100 mL via INTRAVENOUS

## 2022-10-26 MED ORDER — PIPERACILLIN-TAZOBACTAM 3.375 G IVPB
3.3750 g | Freq: Three times a day (TID) | INTRAVENOUS | Status: DC
  Administered 2022-10-27 – 2022-10-29 (×8): 3.375 g via INTRAVENOUS
  Filled 2022-10-26 (×8): qty 50

## 2022-10-26 MED ORDER — LACTATED RINGERS IV BOLUS
1000.0000 mL | Freq: Once | INTRAVENOUS | Status: AC
  Administered 2022-10-26: 1000 mL via INTRAVENOUS

## 2022-10-26 MED ORDER — ACETAMINOPHEN 650 MG RE SUPP: 650.0000 mg | Freq: Four times a day (QID) | RECTAL | Status: DC | PRN

## 2022-10-26 MED ORDER — DICYCLOMINE HCL 10 MG PO CAPS
10.0000 mg | ORAL_CAPSULE | Freq: Three times a day (TID) | ORAL | Status: DC
  Administered 2022-10-26 – 2022-10-29 (×11): 10 mg via ORAL
  Filled 2022-10-26 (×12): qty 1

## 2022-10-26 MED ORDER — PIPERACILLIN-TAZOBACTAM 3.375 G IVPB 30 MIN
3.3750 g | Freq: Once | INTRAVENOUS | Status: AC
  Administered 2022-10-26: 3.375 g via INTRAVENOUS
  Filled 2022-10-26: qty 50

## 2022-10-26 MED ORDER — ZOLPIDEM TARTRATE 5 MG PO TABS: 5.0000 mg | ORAL_TABLET | Freq: Every evening | ORAL | Status: DC | PRN

## 2022-10-26 MED ORDER — ONDANSETRON HCL 4 MG/2ML IJ SOLN: 4.0000 mg | Freq: Four times a day (QID) | INTRAMUSCULAR | Status: DC | PRN

## 2022-10-26 MED ORDER — CELECOXIB 200 MG PO CAPS: 200.0000 mg | ORAL_CAPSULE | Freq: Two times a day (BID) | ORAL | Status: DC

## 2022-10-26 MED ORDER — OXYCODONE HCL 5 MG PO TABS: 5.0000 mg | ORAL_TABLET | Freq: Four times a day (QID) | ORAL | Status: DC | PRN

## 2022-10-26 MED ORDER — ACETAMINOPHEN 325 MG PO TABS
650.0000 mg | ORAL_TABLET | Freq: Four times a day (QID) | ORAL | Status: DC | PRN
  Administered 2022-10-27: 650 mg via ORAL
  Filled 2022-10-26: qty 2

## 2022-10-26 MED ORDER — FAMOTIDINE 20 MG PO TABS: 40.0000 mg | ORAL_TABLET | Freq: Two times a day (BID) | ORAL | Status: DC | PRN

## 2022-10-26 MED ORDER — INULIN 2 G PO CHEW: 2.0000 g | CHEWABLE_TABLET | Freq: Every day | ORAL | Status: DC

## 2022-10-26 MED ORDER — CELECOXIB 200 MG PO CAPS
200.0000 mg | ORAL_CAPSULE | Freq: Every day | ORAL | Status: DC
  Administered 2022-10-27 – 2022-10-29 (×3): 200 mg via ORAL
  Filled 2022-10-26 (×3): qty 1

## 2022-10-26 MED ORDER — MONTELUKAST SODIUM 10 MG PO TABS
10.0000 mg | ORAL_TABLET | Freq: Every day | ORAL | Status: DC
  Administered 2022-10-26 – 2022-10-28 (×3): 10 mg via ORAL
  Filled 2022-10-26 (×3): qty 1

## 2022-10-26 MED ORDER — MORPHINE SULFATE (PF) 2 MG/ML IV SOLN: 2.0000 mg | INTRAVENOUS | Status: DC | PRN

## 2022-10-26 MED ORDER — ONDANSETRON HCL 4 MG PO TABS: 4.0000 mg | ORAL_TABLET | Freq: Four times a day (QID) | ORAL | Status: DC | PRN

## 2022-10-26 MED ORDER — FLUTICASONE PROPIONATE 50 MCG/ACT NA SUSP: 2.0000 | Freq: Every day | NASAL | Status: DC | PRN

## 2022-10-26 MED ORDER — AZELASTINE HCL 0.1 % NA SOLN: 1.0000 | Freq: Every day | NASAL | Status: DC | PRN

## 2022-10-26 NOTE — Assessment & Plan Note (Addendum)
Most likely secondary to diverticulitis-improving Left lower quadrant abdominal pain for 6 weeks Patient was prescribed nitrofurantoin on 09/26/2022 Patient was seen on 08/29/2019 for for dysuria and was prescribed cephalexin 250 mg p.o. twice daily for 5 days Minimal improvement after taking the above antibiotics outpatient

## 2022-10-26 NOTE — Addendum Note (Signed)
Addended by: Adela Ports on: 10/26/2022 12:12 PM   Modules accepted: Orders

## 2022-10-26 NOTE — Assessment & Plan Note (Signed)
Holding home simvastatin at this time due to elevated LFTs

## 2022-10-26 NOTE — Assessment & Plan Note (Signed)
Home Ambien 5 mg nightly as needed for sleep resumed

## 2022-10-26 NOTE — ED Provider Notes (Signed)
[facility] Provider Note    Event Date/Time   First MD Initiated Contact with Patient 10/26/22 1649     (approximate)   History   Chief Complaint Abnormal Scan   HPI  Patricia Oliver is a 72 y.o. female with past medical history of hyperlipidemia and GERD who presents to the ED complaining of abdominal pain.  Patient reports that she has been dealing with 3 weeks of constant pain in the left lower quadrant of her abdomen.  She describes it as a dull ache that is sore to the touch, has not been associated with any fevers, nausea, vomiting, diarrhea, or dysuria.  She reports being seen by her PCP for this problem and has been through multiple rounds of antibiotics for possible UTI.  She has not had any improvement in her symptoms despite this, was seen by her GI doctor yesterday who ordered a CT scan.  This was concerning for diverticulitis with abscess and she was referred to the ED for further evaluation and admission.     Physical Exam   Triage Vital Signs: ED Triage Vitals  Enc Vitals Group     BP 10/26/22 1625 (!) 145/94     Pulse Rate 10/26/22 1625 95     Resp 10/26/22 1625 16     Temp 10/26/22 1625 98.1 F (36.7 C)     Temp Source 10/26/22 1625 Oral     SpO2 10/26/22 1625 97 %     Weight 10/26/22 1644 144 lb 13.5 oz (65.7 kg)     Height 10/26/22 1644 5\' 4"  (1.626 m)     Head Circumference --      Peak Flow --      Pain Score 10/26/22 1643 3     Pain Loc --      Pain Edu? --      Excl. in [place]? --     Most recent vital signs: Vitals:   10/26/22 1625  BP: (!) 145/94  Pulse: 95  Resp: 16  Temp: 98.1 F (36.7 C)  SpO2: 97%    Constitutional: Alert and oriented. Eyes: Conjunctivae are normal. Head: Atraumatic. Nose: No congestion/rhinnorhea. Mouth/Throat: Mucous membranes are moist.  Cardiovascular: Normal rate, regular rhythm. Grossly normal heart sounds.  2+ radial pulses bilaterally. Respiratory: Normal respiratory effort.  No  retractions. Lungs CTAB. Gastrointestinal: Soft and tender to palpation in the left lower quadrant with no rebound or guarding. No distention. Musculoskeletal: No lower extremity tenderness nor edema.  Neurologic:  Normal speech and language. No gross focal neurologic deficits are appreciated.    ED Results / Procedures / Treatments   Labs (all labs ordered are listed, but only abnormal results are displayed) Labs Reviewed  URINALYSIS, ROUTINE W REFLEX MICROSCOPIC  BASIC METABOLIC PANEL  CBC     PROCEDURES:  Critical Care performed: No  Procedures   MEDICATIONS ORDERED IN ED: Medications  piperacillin-tazobactam (ZOSYN) IVPB 3.375 g (has no administration in time range)  lactated ringers bolus 1,000 mL (has no administration in time range)  acetaminophen (TYLENOL) tablet 650 mg (has no administration in time range)    Or  acetaminophen (TYLENOL) suppository 650 mg (has no administration in time range)  ondansetron (ZOFRAN) tablet 4 mg (has no administration in time range)    Or  ondansetron (ZOFRAN) injection 4 mg (has no administration in time range)     IMPRESSION / MDM / ASSESSMENT AND PLAN / ED COURSE  I reviewed the triage vital signs and  the nursing notes.                              72 y.o. female with past medical history of hyperlipidemia and GERD who presents to the ED complaining of constant left lower quadrant abdominal pain worsening over the past 3 weeks.  Patient's presentation is most consistent with acute presentation with potential threat to life or bodily function.  Differential diagnosis includes, but is not limited to, diverticulitis, abscess, bowel perforation.  Patient nontoxic-appearing and in no acute distress, vital signs are unremarkable.  She has focal tenderness to palpation of the left lower quadrant of her abdomen and I reviewed CT imaging that was performed prior to arrival, consistent with diverticulitis and associated abscess, no  evidence of perforation.  Labs obtained yesterday at her GI doctor's office and show no significant anemia or leukocytosis, mild hyponatremia noted with no other electrolyte abnormality or AKI.  Mild transaminitis noted as well.  We will start patient on IV Zosyn, Dr. Tonna Boehringer of general surgery was consulted and case discussed with hospitalist for admission.      FINAL CLINICAL IMPRESSION(S) / ED DIAGNOSES   Final diagnoses:  Diverticulitis of large intestine with abscess without bleeding     Rx / DC Orders   ED Discharge Orders     None        Note:  This document was prepared using Dragon voice recognition software and may include unintentional dictation errors.   Chesley Noon, MD 10/26/22 [phone]

## 2022-10-26 NOTE — ED Triage Notes (Signed)
Pt presents to the ED via POV due to abnormal scan. Pt was seen by pcp for CT scan. CT showed some abnormalities. Pt was sent here  for IV antibiotics. Pt A&Ox4, Pt NAD

## 2022-10-26 NOTE — Telephone Encounter (Signed)
Lft's are improving so no further labs, ok with stat ct

## 2022-10-26 NOTE — Consult Note (Signed)
Subjective:   CC: diverticulitis  HPI:  Patricia Oliver is a 72 y.o. female [facility]  Symptoms were first noted 3 weeks ago. Pain is dull, confined to the suprapubic area, without radiation.  Associated with nothing, exacerbated by nothing.     Past Medical History:  has a past medical history of Dysrhythmia, GERD (gastroesophageal reflux disease), and Hyperlipidemia.  Past Surgical History:  Past Surgical History:  Procedure Laterality Date   ABDOMINAL HYSTERECTOMY     APPENDECTOMY     BREAST BIOPSY Left 1996   neg   BREAST BIOPSY Left 04/06/2021   stereo bx, ribbon clip, neg   COLONOSCOPY N/A 12/10/2014   Procedure: COLONOSCOPY;  Surgeon: Christena DeemMartin U Skulskie, MD;  [facility];  Service: Endoscopy;  Laterality: N/A;   COLONOSCOPY WITH PROPOFOL N/A 11/21/2020   Procedure: COLONOSCOPY WITH PROPOFOL;  Surgeon: Wyline MoodAnna, Kiran, MD;  [facility];  Service: Gastroenterology;  Laterality: N/A;   EXCISION MASS UPPER EXTREMETIES Right 08/09/2021   Procedure: 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  Social History:  reports that she has never smoked. She has never used smokeless tobacco. She reports that she does not currently use alcohol. No history on file for drug use.  Current Medications:  Prior to Admission medications   Medication Sig Start Date End Date Taking? Authorizing Provider  celecoxib (CELEBREX) 200 MG capsule Take 1 capsule (200 mg total) by mouth 2 (two) times daily. Patient taking differently: Take 200 mg by mouth daily. 10/08/22  Yes   Inulin 2 g CHEW Chew by mouth.   Yes  [provider]  montelukast (SINGULAIR) 10 MG tablet TAKE 1 TABLET BY MOUTH ONCE DAILY 06/11/22  Yes   simvastatin (ZOCOR) 40 MG tablet Take 1 tablet (40 mg total) by mouth once daily Patient taking differently: Take 20 mg by mouth daily. 02/27/21  Yes   Ascorbic Acid (VITAMIN C) 100 MG tablet Take 400 mg by mouth daily.    [provider]  azelastine (ASTELIN) 0.1 % nasal spray PLACE 1 SPRAY INTO BOTH NOSTRILS ONCE DAILY 05/25/21     azelastine (OPTIVAR) 0.05 % ophthalmic solution Place 1 drop into both eyes once daily 05/10/22     Calcium Citrate-Vitamin D (CITRACAL + D PO) Take by mouth.    [provider]  dicyclomine (BENTYL) 10 MG capsule Take 1 capsule (10 mg total) by mouth 4 (four) times daily before meals and nightly 05/10/22     famotidine (PEPCID) 40 MG tablet Take 1 tablet (40 mg total) by mouth 2 (two) times daily as needed for Heartburn 11/14/21     fluticasone (FLONASE) 50 MCG/ACT nasal spray Place 2 sprays into both nostrils daily. 09/11/22     glucosamine-chondroitin 500-400 MG tablet Take 1 tablet by mouth daily.    [provider]  guaifenesin (HUMIBID E) 400 MG TABS tablet Take 400 mg by mouth daily.    [provider]  montelukast (SINGULAIR) 10 MG tablet TAKE 1 TABLET BY MOUTH ONCE DAILY 06/12/21     nitrofurantoin, macrocrystal-monohydrate, (MACROBID) 100 MG capsule Take  1 capsule (100 mg total) by mouth 2 (two) times daily for 5 days. 09/26/22     vitamin E 400 UNIT capsule Take 400 Units by mouth daily.    [provider]  zolpidem (AMBIEN) 5 MG tablet TAKE 1 TABLET BY MOUTH NIGHTLY AS NEEDED FOR SLEEP 07/01/20 08/01/21  Kandyce RudBabaoff, Marcus, MD    Allergies:  Allergies as of 10/26/2022 - Review Complete 10/26/2022  Allergen Reaction Noted   Ciprofloxacin Swelling and Rash 12/01/2014   Elemental sulfur Itching, Swelling, and Rash 12/01/2014    ROS:  General: Denies weight loss, weight gain, fatigue, fevers, chills, and  night sweats. Eyes: Denies blurry vision, double vision, eye pain, itchy eyes, and tearing. Ears: Denies hearing loss, earache, and ringing in ears. Nose: Denies sinus pain, congestion, infections, runny nose, and nosebleeds. Mouth/throat: Denies hoarseness, sore throat, bleeding gums, and difficulty swallowing. Heart: Denies chest pain, palpitations, racing heart, irregular heartbeat, leg pain or swelling, and decreased activity tolerance. Respiratory: Denies breathing difficulty, shortness of breath, wheezing, cough, and sputum. GI: Denies change in appetite, heartburn, nausea, vomiting, constipation, diarrhea, and blood in stool. GU: Denies difficulty urinating, pain with urinating, urgency, frequency, blood in urine. Musculoskeletal: Denies joint stiffness, pain, swelling, muscle weakness. Skin: Denies rash, itching, mass, tumors, sores, and boils Neurologic: Denies headache, fainting, dizziness, seizures, numbness, and tingling. Psychiatric: Denies depression, anxiety, difficulty sleeping, and memory loss. Endocrine: Denies heat or cold intolerance, and increased thirst or urination. Blood/lymph: Denies easy bruising, easy bruising, and swollen glands     Objective:     BP (!) 148/99 (BP Location: Left Arm)   Pulse 91   Temp 98.2 F (36.8 C) (Oral)   Resp 18   Ht 5\' 4"  (1.626 m)   Wt 65.7 kg   SpO2 100%   BMI 24.86 kg/m   Constitutional :  alert, cooperative, appears stated age, and no distress  Lymphatics/Throat:  no asymmetry, masses, or scars  Respiratory:  clear to auscultation bilaterally  Cardiovascular:  regular rate and rhythm  Gastrointestinal: Soft, no guarding, suprapubic and LLQ TTP, minor .   Musculoskeletal: Steady movement  Skin: Cool and moist   Psychiatric: Normal affect, non-agitated, not confused       LABS:     Latest Ref Rng & Units 10/26/2022    5:45 PM 10/25/2022    3:39 PM 08/03/2016    4:18 PM  CMP  Glucose 70 - 99 mg/dL 92  [id number]    BUN 8 - 23  mg/dL 13  18    Creatinine 0.960.44 - 1.00 mg/dL 0.450.70  4.090.85  8.110.70   Sodium 135 - 145 mmol/L 137  145    Potassium 3.5 - 5.1 mmol/L 3.5  4.2    Chloride 98 - 111 mmol/L 98  109    CO2 22 - 32 mmol/L 27  21    Calcium 8.9 - 10.3 mg/dL 91.410.6  9.9    Total Protein 6.5 - 8.1 g/dL 8.1  7.0    Total Bilirubin 0.3 - 1.2 mg/dL 0.6  <7.8<0.2    Alkaline Phos 38 - 126 U/L 132  159    AST 15 - 41 U/L 28  43    ALT 0 - 44 U/L 54  65        Latest Ref Rng & Units 10/26/2022    5:45 PM 10/25/2022    3:39 PM 11/01/2016    9:53 AM  CBC  WBC 4.0 - 10.5 K/uL 7.5  7.2  4.3   Hemoglobin 12.0 - 15.0 g/dL 02.1  11.5  52.0   Hematocrit 36.0 - 46.0 % 44.8  44.2  37.5   Platelets 150 - 400 K/uL 351  340  222     RADS: CLINICAL DATA:  Abnormal liver function tests. Abdominal pain in the left lower quadrant   EXAM: CT ABDOMEN AND PELVIS WITH CONTRAST   TECHNIQUE: Multidetector CT imaging of the abdomen and pelvis was performed using the standard protocol following bolus administration of intravenous contrast.   RADIATION DOSE REDUCTION: This exam was performed according to the departmental dose-optimization program which includes automated exposure control, adjustment of the mA and/or kV according to patient size and/or use of iterative reconstruction technique.   CONTRAST:  OMNIPAQUE IOHEXOL 300 MG/ML  SOLN   COMPARISON:  08/03/2016   FINDINGS: Lower chest: Mild cardiomegaly.  Small type 1 hiatal hernia.   Hepatobiliary: Stable 0.9 cm hemangioma posteriorly in the right hepatic lobe on image 18 series 2. Possible adjacent flash filling 4 mm hemangioma posteriorly in the right hepatic lobe on image 15 series 2. No worrisome hepatic lesions identified. Gallbladder unremarkable. No biliary dilatation. A cause for the patient's abnormal liver function tests is not identified. Small amount of hepatic steatosis adjacent to the falciform ligament. Tiny vascular shunt versus hemangioma in the dome of  the left hepatic lobe on image 23 series 5.   Pancreas: Unremarkable   Spleen: Unremarkable   Adrenals/Urinary Tract: Unremarkable   Stomach/Bowel: Notable degree of acute sigmoid colon diverticulitis involving a 5.5 cm segment of bowel demonstrating substantial abnormal wall thickening and adjacent mesenteric stranding (image 66, series 2). No extraluminal gas is observed but there is an adjacent 4.2 by 1.7 by 3.6 cm fluid collection just below the inflamed portion of the sigmoid colon which currently does not have a thick enhancing wall. This could reflect sterile local fluid collection related to inflammation, or incipient abscess.   Vascular/Lymphatic: Unremarkable   Reproductive: Uterus absent.  Adnexa unremarkable.   Other: No supplemental non-categorized findings.   Musculoskeletal: Leftward tilt of the lower anterior sternum.   Lower lumbar spondylosis and degenerative disc disease.   IMPRESSION: 1. Acute sigmoid colon diverticulitis involving a 5.5 cm segment of bowel demonstrating substantial abnormal wall thickening and adjacent mesenteric stranding. No extraluminal gas is observed but there is an adjacent 4.2 by 1.7 by 3.6 cm fluid collection just below the inflamed portion of the sigmoid colon which currently does not have a thick enhancing wall. This could reflect sterile local fluid collection related to inflammation, or incipient abscess. 2. Mild cardiomegaly. 3. Small type 1 hiatal hernia. 4. Lower lumbar spondylosis and degenerative disc disease.     Electronically Signed   By: Gaylyn Rong M.D.   On: 10/26/2022 15:52 Assessment:   diverticulitis  Plan:  Chronic with abscess now, start IV abx.  IR to assess for possible drainage.  No surgical intervention at this time.  The patient verbalized understanding and all questions were answered to the patient's satisfaction.  labs/images/medications/previous chart entries reviewed personally and  relevant changes/updates noted above.

## 2022-10-26 NOTE — Hospital Course (Signed)
Patricia Oliver is a 72 year old female with history of seasonal allergies, hyperlipidemia, insomnia, GERD, who presents to emergency department from GI clinic for chief concerns of diverticulitis with possible abscess.  Initial vitals show temperature of 98.1, respiration rate of 16, heart rate 95, blood pressure 145/94, SpO2 of 97% on room air.  Outpatient labs from 10/25/2022: Serum sodium is 145, potassium 4.2, chloride 109, bicarb 21, BUN of 18, serum creatinine 0.85, EGFR 73, nonfasting blood glucose 106, WBC 7.2, hemoglobin 13.7, platelets 340.  AST elevated at 43, ALT 63, alk phos elevated at 159.  ED treatment Zosyn, LR 1 L bolus.

## 2022-10-26 NOTE — Progress Notes (Signed)
PHARMACIST - PHYSICIAN ORDER COMMUNICATION  CONCERNING: P&T Medication Policy on Herbal Medications  DESCRIPTION:  This patient's order for:  inulin 2g  has been noted.  This product(s) is classified as an "herbal" or natural product. Due to a lack of definitive safety studies or FDA approval, nonstandard manufacturing practices, plus the potential risk of unknown drug-drug interactions while on inpatient medications, the Pharmacy and Therapeutics Committee does not permit the use of "herbal" or natural products of this type within [facility].   ACTION TAKEN: The pharmacy department is unable to verify this order at this time and your patient has been informed of this safety policy. Please reevaluate patient's clinical condition at discharge and address if the herbal or natural product(s) should be resumed at that time.

## 2022-10-26 NOTE — Telephone Encounter (Signed)
Patricia Oliver called asking for her lab results. Patricia Oliver stated that you had told her that if her labs were elevated that you wanted her to come back today to the office for more labs. Patricia Oliver also wanted me to change her CT Scan as a STAT as she believes that in one weeks time, a lot could happen. Her Ct Scan is scheduled for next Thursday. Please advise with the lab results and what you want for her CT Scan if wanting it done before Thursday.

## 2022-10-26 NOTE — Assessment & Plan Note (Signed)
Famotidine twice daily as needed for heartburn/reflux

## 2022-10-26 NOTE — ED Notes (Signed)
First Nurse Note:  Per Dr. Tobi Bastos, GI specialist, Patient had a CT scan yesterday that showed diverticulitis with possible abscess (4cm fluid collection) and Dr. Tobi Bastos wants her evaluated in the ED. Patient was ambulatory to triage and came in alone.

## 2022-10-26 NOTE — Assessment & Plan Note (Addendum)
With abscess, unfortunately not amenable for drain placement but clinically seems improving. Continue Zosyn per pharmacy Start her on diet and monitor

## 2022-10-26 NOTE — H&P (Signed)
History and Physical   Patricia Oliver UEA:[id number] DOB: 1950-11-20 DOA: 10/26/2022  PCP: Kandyce Rud, MD  Outpatient Specialists: Dr. Tobi Bastos, GI Patient coming from: home via POV  I have personally briefly reviewed patient's old medical records in [facility] EMR.  Chief Concern: Abdominal pain and elevated LFTs  HPI: [name] is a 72 year old female with history of seasonal allergies, hyperlipidemia, insomnia, GERD, who presents to emergency department from GI clinic for chief concerns of diverticulitis with possible abscess.  Initial vitals show temperature of 98.1, respiration rate of 16, heart rate 95, blood pressure 145/94, SpO2 of 97% on room air.  Outpatient labs from 10/25/2022: Serum sodium is 145, potassium 4.2, chloride 109, bicarb 21, BUN of 18, serum creatinine 0.85, EGFR 73, nonfasting blood glucose 106, WBC 7.2, hemoglobin 13.7, platelets 340.  AST elevated at 43, ALT 63, alk phos elevated at 159.  ED treatment Zosyn, LR 1 L bolus. ----------------------------- At bedside patient is awake alert and oriented to self, age, location.  She reports that she has been having left lower quadrant abdominal pain for the past 3 weeks.  She has not been initiated on antibiotics due to outpatient provider still working up as to what is going on.  She reports initially the pain was bilaterally in the lower abdomen and felt like cramping.  She denies fever, nausea, vomiting, chest pain, shortness of breath, swelling of her lower extremities.  Social history: She lives at home with her husband.  She denies tobacco and recreational drug use.  She infrequently drinks EtOH and her last drink was in January 2024.  She is semiretired and currently working minor jobs.  ROS: Constitutional: no weight change, no fever ENT/Mouth: no sore throat, no rhinorrhea Eyes: no eye pain, no vision changes Cardiovascular: no chest pain, no dyspnea,  no edema, no palpitations Respiratory: no  cough, no sputum, no wheezing Gastrointestinal: no nausea, no vomiting, no diarrhea, no constipation Genitourinary: no urinary incontinence, no dysuria, no hematuria Musculoskeletal: no arthralgias, no myalgias Skin: no skin lesions, no pruritus, Neuro: no weakness, no loss of consciousness, no syncope Psych: no anxiety, no depression, no decrease appetite Heme/Lymph: no bruising, no bleeding  ED Course: Discussed with emergency medicine provider, patient requiring hospitalization for chief concerns of diverticulitis with abscess.  Assessment/Plan  Principal Problem:   Diverticulitis Active Problems:   Allergic rhinitis   Esophageal reflux   HLD (hyperlipidemia)   Primary osteoarthritis of left knee   Insomnia   Abdominal pain   Assessment and Plan:  * Diverticulitis With abscess EDP has consulted general surgery however patient may need IR intervention for drain placement IR consulted for percutaneous drain placement N.p.o. after midnight in anticipation of IR procedure Continue Zosyn per pharmacy Sodium chloride infusion at 125 mL/h, 1 day ordered Admit to telemetry surgical unit, inpatient  Abdominal pain Left lower quadrant abdominal pain for 6 weeks Patient was prescribed nitrofurantoin on 09/26/2022 Patient was seen on 08/29/2019 for for dysuria and was prescribed cephalexin 250 mg p.o. twice daily for 5 days Minimal improvement after taking the above antibiotics outpatient  Insomnia Home Ambien 5 mg nightly as needed for sleep resumed  Primary osteoarthritis of left knee Home Celebrex 200 mg daily resumed for 10/27/2022  HLD (hyperlipidemia) Holding home simvastatin at this time due to elevated LFTs  Esophageal reflux Famotidine twice daily as needed for heartburn/reflux  Allergic rhinitis As needed Astelin 1 spray versus fluticasone 2 spray each nare as needed were resumed Montelukast 10  mg nightly resumed  Chart reviewed.   DVT prophylaxis: ted hose; AM  team to initiate pharmacologic DVT prophylaxis when the benefits outweigh the risk. Code Status: full code Diet: heart healthy now; NPO after midnight Family Communication: no, patient updated her husband over the phone Disposition Plan: Pending clinical course Consults called: IR; EDP consulted general surgery Admission status: Telemetry, surgical, inpatient  Past Medical History:  Diagnosis Date   Dysrhythmia    GERD (gastroesophageal reflux disease)    Hyperlipidemia    Past Surgical History:  Procedure Laterality Date   ABDOMINAL HYSTERECTOMY     APPENDECTOMY     BREAST BIOPSY Left 1996   neg   BREAST BIOPSY Left 04/06/2021   stereo bx, ribbon clip, neg   COLONOSCOPY N/A 12/10/2014   Procedure: COLONOSCOPY;  Surgeon: Christena Deem, MD;  Location: [facility] ENDOSCOPY;  Service: Endoscopy;  Laterality: N/A;   COLONOSCOPY WITH PROPOFOL N/A 11/21/2020   Procedure: COLONOSCOPY WITH PROPOFOL;  Surgeon: Wyline Mood, MD;  Location: [facility] ENDOSCOPY;  Service: Gastroenterology;  Laterality: N/A;   EXCISION MASS UPPER EXTREMETIES Right 08/09/2021   Procedure: 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 She has never used smokeless tobacco. She reports that she does not currently use alcohol. No history on file for drug use.  Allergies  Allergen Reactions   Ciprofloxacin Swelling and Rash   Elemental Sulfur Itching, Swelling and Rash   Family History  Problem Relation Age of Onset   Breast cancer Sister 39   Breast cancer Paternal Aunt    Breast cancer Paternal Aunt    Breast cancer Sister 21       dx twice with breast ca   Family history: Family history reviewed and not pertinent.  Prior to Admission medications   Medication Sig Start Date End Date Taking? Authorizing  Provider  Ascorbic Acid (VITAMIN C) 100 MG tablet Take 400 mg by mouth daily.    [provider]  azelastine (ASTELIN) 0.1 % nasal spray PLACE 1 SPRAY INTO BOTH NOSTRILS ONCE DAILY 05/25/21     azelastine (OPTIVAR) 0.05 % ophthalmic solution Place 1 drop into both eyes once daily 05/10/22     Calcium Citrate-Vitamin D (CITRACAL + D PO) Take by mouth.    [provider]  celecoxib (CELEBREX) 200 MG capsule Take by mouth. 10/14/15   [provider]  celecoxib (CELEBREX) 200 MG capsule Take 1 capsule (200 mg total) by mouth 2 (two) times daily. 10/08/22     dicyclomine (BENTYL) 10 MG capsule Take 1 capsule (10 mg total) by mouth 4 (four) times daily before meals and nightly 05/10/22     famotidine (PEPCID) 40 MG tablet Take 1 tablet (40 mg total) by mouth 2 (two) times daily as needed for Heartburn 11/14/21     fluticasone (FLONASE) 50 MCG/ACT nasal spray Place 2 sprays into both nostrils daily. 09/11/22     glucosamine-chondroitin 500-400 MG tablet Take 1 tablet by mouth daily.    [provider]  guaifenesin (HUMIBID E) 400 MG TABS tablet Take 400 mg by mouth daily.    [provider]  Inulin 2 g CHEW Chew by mouth.    [provider]  montelukast (SINGULAIR) 10 MG tablet TAKE 1 TABLET BY MOUTH ONCE DAILY  06/12/21     montelukast (SINGULAIR) 10 MG tablet TAKE 1 TABLET BY MOUTH ONCE DAILY 06/11/22     nitrofurantoin, macrocrystal-monohydrate, (MACROBID) 100 MG capsule Take 1 capsule (100 mg total) by mouth 2 (two) times daily for 5 days. 09/26/22     simvastatin (ZOCOR) 40 MG tablet Take 1 tablet (40 mg total) by mouth once daily Patient taking differently: Take 20 mg by mouth daily. 02/27/21     vitamin E 400 UNIT capsule Take 400 Units by mouth daily.    [provider]  zolpidem (AMBIEN) 5 MG tablet TAKE 1 TABLET BY MOUTH NIGHTLY AS NEEDED FOR SLEEP 07/01/20 08/01/21  Kandyce Rud, MD   Physical Exam: Vitals:   10/26/22 1625 10/26/22  1644  BP: (!) 145/94   Pulse: 95   Resp: 16   Temp: 98.1 F (36.7 C)   TempSrc: Oral   SpO2: 97%   Weight:  65.7 kg  Height:  5\' 4"  (1.626 m)   Constitutional: appears age appropriate, NAD, calm, comfortable Eyes: PERRL, lids and conjunctivae normal ENMT: Mucous membranes are moist. Posterior pharynx clear of any exudate or lesions. Age-appropriate dentition. Hearing appropriate Neck: normal, supple, no masses, no thyromegaly Respiratory: clear to auscultation bilaterally, no wheezing, no crackles. Normal respiratory effort. No accessory muscle use.  Cardiovascular: Regular rate and rhythm, no murmurs / rubs / gallops. No extremity edema. 2+ pedal pulses. No carotid bruits.  Abdomen: LLQ tenderness, no masses palpated, no hepatosplenomegaly. Bowel sounds positive.  Musculoskeletal: no clubbing / cyanosis. No joint deformity upper and lower extremities. Good ROM, no contractures, no atrophy. Normal muscle tone.  Skin: no rashes, lesions, ulcers. No induration Neurologic: Sensation intact. Strength 5/5 in all 4.  Psychiatric: Normal judgment and insight. Alert and oriented x 3. Normal mood.   EKG: not indicated at this time  Chest x-ray on Admission: I personally reviewed and I agree with radiologist reading as below.  CT Abdomen Pelvis W Contrast  Result Date: 10/26/2022 CLINICAL DATA:  Abnormal liver function tests. Abdominal pain in the left lower quadrant EXAM: CT ABDOMEN AND PELVIS WITH CONTRAST TECHNIQUE: Multidetector CT imaging of the abdomen and pelvis was performed using the standard protocol following bolus administration of intravenous contrast. RADIATION DOSE REDUCTION: This exam was performed according to the departmental dose-optimization program which includes automated exposure control, adjustment of the mA and/or kV according to patient size and/or use of iterative reconstruction technique. CONTRAST:  OMNIPAQUE IOHEXOL 300 MG/ML  SOLN COMPARISON:  08/03/2016  FINDINGS: Lower chest: Mild cardiomegaly.  Small type 1 hiatal hernia. Hepatobiliary: Stable 0.9 cm hemangioma posteriorly in the right hepatic lobe on image 18 series 2. Possible adjacent flash filling 4 mm hemangioma posteriorly in the right hepatic lobe on image 15 series 2. No worrisome hepatic lesions identified. Gallbladder unremarkable. No biliary dilatation. A cause for the patient's abnormal liver function tests is not identified. Small amount of hepatic steatosis adjacent to the falciform ligament. Tiny vascular shunt versus hemangioma in the dome of the left hepatic lobe on image 23 series 5. Pancreas: Unremarkable Spleen: Unremarkable Adrenals/Urinary Tract: Unremarkable Stomach/Bowel: Notable degree of acute sigmoid colon diverticulitis involving a 5.5 cm segment of bowel demonstrating substantial abnormal wall thickening and adjacent mesenteric stranding (image 66, series 2). No extraluminal gas is observed but there is an adjacent 4.2 by 1.7 by 3.6 cm fluid collection just below the inflamed portion of the sigmoid colon which currently does not have a thick enhancing wall. This could reflect sterile  local fluid collection related to inflammation, or incipient abscess. Vascular/Lymphatic: Unremarkable Reproductive: Uterus absent.  Adnexa unremarkable. Other: No supplemental non-categorized findings. Musculoskeletal: Leftward tilt of the lower anterior sternum. Lower lumbar spondylosis and degenerative disc disease. IMPRESSION: 1. Acute sigmoid colon diverticulitis involving a 5.5 cm segment of bowel demonstrating substantial abnormal wall thickening and adjacent mesenteric stranding. No extraluminal gas is observed but there is an adjacent 4.2 by 1.7 by 3.6 cm fluid collection just below the inflamed portion of the sigmoid colon which currently does not have a thick enhancing wall. This could reflect sterile local fluid collection related to inflammation, or incipient abscess. 2. Mild cardiomegaly. 3.  Small type 1 hiatal hernia. 4. Lower lumbar spondylosis and degenerative disc disease. Electronically Signed   By: Gaylyn Rong M.D.   On: 10/26/2022 15:52    Labs on Admission: I have personally reviewed following labs CBC: Recent Labs  Lab 10/25/22 1539 10/26/22 1745  WBC 7.2 7.5  NEUTROABS 5.3 5.4  HGB 13.5 13.8  HCT 44.2 44.8  MCV 79 77.4*  PLT 340 351   Basic Metabolic Panel: Recent Labs  Lab 10/25/22 1539  NA 145*  K 4.2  CL 109*  CO2 21  GLUCOSE 106*  BUN 18  CREATININE 0.85  CALCIUM 9.9   GFR: Estimated Creatinine Clearance: 56.6 mL/min (by C-G formula based on SCr of 0.85 mg/dL).  Liver Function Tests: Recent Labs  Lab 10/25/22 1539  AST 43*  ALT 65*  ALKPHOS 159*  BILITOT <0.2  PROT 7.0  ALBUMIN 4.0   Urine analysis:    Component Value Date/Time   COLORURINE STRAW (A) 10/26/2022 1745   APPEARANCEUR CLEAR (A) 10/26/2022 1745   APPEARANCEUR Hazy (A) 08/30/2016 1303   LABSPEC >1.046 (H) 10/26/2022 1745   PHURINE 6.0 10/26/2022 1745   GLUCOSEU NEGATIVE 10/26/2022 1745   HGBUR NEGATIVE 10/26/2022 1745   BILIRUBINUR NEGATIVE 10/26/2022 1745   BILIRUBINUR negative 10/20/2022 0948   BILIRUBINUR Negative 08/30/2016 1303   KETONESUR NEGATIVE 10/26/2022 1745   PROTEINUR NEGATIVE 10/26/2022 1745   UROBILINOGEN 0.2 10/20/2022 0948   NITRITE NEGATIVE 10/26/2022 1745   LEUKOCYTESUR NEGATIVE 10/26/2022 1745   This document was prepared using Dragon Voice Recognition software and may include unintentional dictation errors.  Dr. Sedalia Muta [facility]  If 7PM-7AM, please contact overnight-coverage provider If 7AM-7PM, please contact day coverage provider [email or web address]  10/26/2022, 6:16 PM

## 2022-10-26 NOTE — ED Notes (Signed)
Request made for transport to the floor ?

## 2022-10-26 NOTE — Progress Notes (Signed)
Repeat LFT's in 6 weeks to ensure normalized

## 2022-10-26 NOTE — Assessment & Plan Note (Addendum)
As needed Astelin 1 spray versus fluticasone 2 spray each nare as needed were resumed Montelukast 10 mg nightly resumed

## 2022-10-26 NOTE — Telephone Encounter (Signed)
Called patient to let her know that Dr. Tobi Bastos is okay for me to change the order to STAT for today. I was able to schedule it for today 10/26/2022 at 1:00 PM to the [facility]. Patient stated that she will be going. Patient had no further questions.

## 2022-10-26 NOTE — Assessment & Plan Note (Signed)
Home Celebrex 200 mg daily resumed for 10/27/2022

## 2022-10-26 NOTE — Consult Note (Signed)
Pharmacy Antibiotic Note  Patricia Oliver is a 72 y.o. female admitted on 10/26/2022 with concern for intra-abdominal infection. PMH significant for HLD, GERD. Patient has documented intolerance to ciprofloxacin, but no other antibiotic allergies. She has recently taken nitrofurantoin, doxycycline, and cephalexin in the past few months for possible UTI. Patient reports 3 weeks of dull ache that is sore to the touch at LLQ. She was seen by GI 10/25/22 and had CT that was concerning for diverticulitis with abscess. Pharmacy has been consulted for Zosyn dosing.  Plan: Day 1 of antibiotics Continue Zosyn 3.375 g IV Q8H Continue to monitor renal function and follow culture results   Height: 5\' 4"  (162.6 cm) Weight: 65.7 kg (144 lb 13.5 oz) IBW/kg (Calculated) : 54.7  Temp (24hrs), Avg:98.1 F (36.7 C), Min:98.1 F (36.7 C), Max:98.1 F (36.7 C)  Recent Labs  Lab 10/25/22 1539  WBC 7.2  CREATININE 0.85    Estimated Creatinine Clearance: 56.6 mL/min (by C-G formula based on SCr of 0.85 mg/dL).    Allergies  Allergen Reactions   Morphine And Related Nausea And Vomiting   Ciprofloxacin Swelling and Rash   Elemental Sulfur Itching, Swelling and Rash    Antimicrobials this admission: 4/5 Zofran >>   Dose adjustments this admission: N/A  Microbiology results: N/A  Thank you for allowing pharmacy to be a part of this patient's care.  Celene Squibb, PharmD PGY1 Pharmacy Resident 10/26/2022 5:52 PM

## 2022-10-27 DIAGNOSIS — M1712 Unilateral primary osteoarthritis, left knee: Secondary | ICD-10-CM | POA: Diagnosis not present

## 2022-10-27 DIAGNOSIS — R1032 Left lower quadrant pain: Secondary | ICD-10-CM

## 2022-10-27 DIAGNOSIS — K5792 Diverticulitis of intestine, part unspecified, without perforation or abscess without bleeding: Secondary | ICD-10-CM | POA: Diagnosis not present

## 2022-10-27 LAB — CBC
HCT: 38.4 % (ref 36.0–46.0)
Hemoglobin: 11.9 g/dL — ABNORMAL LOW (ref 12.0–15.0)
MCH: 24.1 pg — ABNORMAL LOW (ref 26.0–34.0)
MCHC: 31 g/dL (ref 30.0–36.0)
MCV: 77.9 fL — ABNORMAL LOW (ref 80.0–100.0)
Platelets: 286 10*3/uL (ref 150–400)
RBC: 4.93 MIL/uL (ref 3.87–5.11)
RDW: 18.3 % — ABNORMAL HIGH (ref 11.5–15.5)
WBC: 4.8 10*3/uL (ref 4.0–10.5)
nRBC: 0 % (ref 0.0–0.2)

## 2022-10-27 LAB — BASIC METABOLIC PANEL
Anion gap: 9 (ref 5–15)
BUN: 11 mg/dL (ref 8–23)
CO2: 26 mmol/L (ref 22–32)
Calcium: 8.9 mg/dL (ref 8.9–10.3)
Chloride: 107 mmol/L (ref 98–111)
Creatinine, Ser: 0.6 mg/dL (ref 0.44–1.00)
GFR, Estimated: >60 mL/min (ref >60)
Glucose, Bld: 96 mg/dL (ref 70–99)
Potassium: 3.9 mmol/L (ref 3.5–5.1)
Sodium: 142 mmol/L (ref 135–145)

## 2022-10-27 MED ORDER — GUAIFENESIN ER 600 MG PO TB12
600.0000 mg | ORAL_TABLET | Freq: Every day | ORAL | Status: DC
  Administered 2022-10-27 – 2022-10-29 (×3): 600 mg via ORAL
  Filled 2022-10-27 (×3): qty 1

## 2022-10-27 NOTE — Progress Notes (Signed)
Subjective:  CC: Patricia Oliver is a 72 y.o. female  Hospital stay day 1,   diverticulitis  HPI: No acute issues overnight.  No complaints of pain.  ROS:  General: Denies weight loss, weight gain, fatigue, fevers, chills, and night sweats. Heart: Denies chest pain, palpitations, racing heart, irregular heartbeat, leg pain or swelling, and decreased activity tolerance. Respiratory: Denies breathing difficulty, shortness of breath, wheezing, cough, and sputum. GI: Denies change in appetite, heartburn, nausea, vomiting, constipation, diarrhea, and blood in stool. GU: Denies difficulty urinating, pain with urinating, urgency, frequency, blood in urine.   Objective:   Temp:  [98.2 F (36.8 C)-98.4 F (36.9 C)] 98.4 F (36.9 C) (04/06 1556) Pulse Rate:  [73-91] 75 (04/06 1556) Resp:  [18-20] 20 (04/06 1556) BP: (129-148)/(84-99) 132/84 (04/06 1556) SpO2:  [97 %-100 %] 97 % (04/06 1556)     Height: 5\' 4"  (162.6 cm) Weight: 65.7 kg BMI (Calculated): 24.85   Intake/Output this shift:   Intake/Output Summary (Last 24 hours) at 10/27/2022 1746 Last data filed at 10/27/2022 1508 Gross per 24 hour  Intake 2192.99 ml  Output --  Net 2192.99 ml    Constitutional :  alert, cooperative, appears stated age, and no distress  Respiratory:  clear to auscultation bilaterally  Cardiovascular:  regular rate and rhythm  Gastrointestinal: soft, non-tender; bowel sounds normal; no masses,  no organomegaly.   Skin: Cool and moist.   Psychiatric: Normal affect, non-agitated, not confused       LABS:     Latest Ref Rng & Units 10/27/2022    6:04 AM 10/26/2022    5:45 PM 10/25/2022    3:39 PM  CMP  Glucose 70 - 99 mg/dL 96  92  229   BUN 8 - 23 mg/dL 11  13  18    Creatinine 0.44 - 1.00 mg/dL 7.98  9.21  1.94   Sodium 135 - 145 mmol/L 142  137  145   Potassium 3.5 - 5.1 mmol/L 3.9  3.5  4.2   Chloride 98 - 111 mmol/L 107  98  109   CO2 22 - 32 mmol/L 26  27  21    Calcium 8.9 - 10.3 mg/dL 8.9  17.4   9.9   Total Protein 6.5 - 8.1 g/dL  8.1  7.0   Total Bilirubin 0.3 - 1.2 mg/dL  0.6  <0.8   Alkaline Phos 38 - 126 U/L  132  159   AST 15 - 41 U/L  28  43   ALT 0 - 44 U/L  54  65       Latest Ref Rng & Units 10/27/2022    6:04 AM 10/26/2022    5:45 PM 10/25/2022    3:39 PM  CBC  WBC 4.0 - 10.5 K/uL 4.8  7.5  7.2   Hemoglobin 12.0 - 15.0 g/dL 14.4  81.8  56.3   Hematocrit 36.0 - 46.0 % 38.4  44.8  44.2   Platelets 150 - 400 K/uL 286  351  340     RADS: N/a Assessment:   Diverticulitis with fluid collection noted.  Clinically continues to do well.  IR unable to drain the fluid collection.  Continue IV antibiotics to ensure no recurrence of symptoms.  Will advance diet and as long as no recurrence of symptoms hopefully home in the next day or 2 on oral antibiotics and continued monitoring as an outpatient.  labs/images/medications/previous chart entries reviewed personally and relevant changes/updates noted above.

## 2022-10-27 NOTE — Progress Notes (Signed)
Progress Note   Patient: Patricia Oliver ZOX:[id number] DOB: [birth year] DOA: 10/26/2022     1 DOS: the patient was seen and examined on 10/27/2022   Brief hospital course: [name] is a 72 year old female with history of seasonal allergies, hyperlipidemia, insomnia, GERD, who presents to emergency department from GI clinic for chief concerns of diverticulitis with possible abscess.  Initial vitals show temperature of 98.1, respiration rate of 16, heart rate 95, blood pressure 145/94, SpO2 of 97% on room air.  Outpatient labs from 10/25/2022: Serum sodium is 145, potassium 4.2, chloride 109, bicarb 21, BUN of 18, serum creatinine 0.85, EGFR 73, nonfasting blood glucose 106, WBC 7.2, hemoglobin 13.7, platelets 340.  UA negative  AST elevated at 43, ALT 63, alk phos elevated at 159. CT abdomen with concern of acute sigmoid colon diverticulitis and and concern of adjacent fluid collection that could be an abscess.  ED treatment Zosyn, LR 1 L bolus.  General surgery was consulted and they were recommending IR to put drain-IR was also consulted by admitting provider.  4/6: Vital and labs stable.  Pain improving.  Her possible abscess is not very amenable for drain placement per IR.  Clinically seems improving-started on diet  Assessment and Plan: * Diverticulitis With abscess, unfortunately not amenable for drain placement but clinically seems improving. Continue Zosyn per pharmacy Start her on diet and monitor  Abdominal pain Most likely secondary to diverticulitis-improving Left lower quadrant abdominal pain for 6 weeks Patient was prescribed nitrofurantoin on 09/26/2022 Patient was seen on 08/29/2019 for for dysuria and was prescribed cephalexin 250 mg p.o. twice daily for 5 days Minimal improvement after taking the above antibiotics outpatient  Allergic rhinitis As needed Astelin 1 spray versus fluticasone 2 spray each nare as needed were resumed Montelukast 10 mg nightly  resumed  Esophageal reflux Famotidine twice daily as needed for heartburn/reflux  HLD (hyperlipidemia) Holding home simvastatin at this time due to elevated LFTs  Primary osteoarthritis of left knee Home Celebrex 200 mg daily resumed for 10/27/2022  Insomnia Home Ambien 5 mg nightly as needed for sleep resumed   Subjective: Patient was seen and examined today.  Continue to have some left lower quadrant pain but stating that it is much improved than before.  No nausea or vomiting  Physical Exam: Vitals:   10/26/22 1644 10/26/22 2103 10/27/22 0427 10/27/22 0840  BP:  (!) 148/99 (!) 129/93 (!) 144/89  Pulse:  91 73 74  Resp:  18 18 20   Temp:  98.2 F (36.8 C) 98.3 F (36.8 C) 98.3 F (36.8 C)  TempSrc:  Oral Oral   SpO2:  100% 99% 98%  Weight: 65.7 kg     Height: 5\' 4"  (1.626 m)      General.  Well-developed lady, in no acute distress. Pulmonary.  Lungs clear bilaterally, normal respiratory effort. CV.  Regular rate and rhythm, no JVD, rub or murmur. Abdomen.  Soft, nontender, nondistended, BS positive. CNS.  Alert and oriented .  No focal neurologic deficit. Extremities.  No edema, no cyanosis, pulses intact and symmetrical. Psychiatry.  Judgment and insight appears normal.   Data Reviewed: Prior data reviewed  Family Communication: Husband at bedside  Disposition: Status is: Inpatient Remains inpatient appropriate because: Severity of illness  Planned Discharge Destination: Home  Time spent: 45 minutes  This record has been created using Conservation officer, historic buildings. Errors have been sought and corrected,but may not always be located. Such creation errors do not reflect on the  standard of care.   Author: Arnetha Courser, MD 10/27/2022 2:15 PM  For on call review [email or web address].

## 2022-10-28 DIAGNOSIS — R1032 Left lower quadrant pain: Secondary | ICD-10-CM | POA: Diagnosis not present

## 2022-10-28 DIAGNOSIS — K5792 Diverticulitis of intestine, part unspecified, without perforation or abscess without bleeding: Secondary | ICD-10-CM | POA: Diagnosis not present

## 2022-10-28 DIAGNOSIS — M1712 Unilateral primary osteoarthritis, left knee: Secondary | ICD-10-CM | POA: Diagnosis not present

## 2022-10-28 MED ORDER — SODIUM CHLORIDE 0.9 % IV SOLN: INTRAVENOUS | Status: DC | PRN

## 2022-10-28 NOTE — Progress Notes (Signed)
Subjective:  CC: NEKESHIA BRAMMER is a 72 y.o. female  Hospital stay day 2,   diverticulitis  HPI: No acute issues overnight.  Full liquid for dinner.  Noticing some increasing discomfort this am.  ROS:  General: Denies weight loss, weight gain, fatigue, fevers, chills, and night sweats. Heart: Denies chest pain, palpitations, racing heart, irregular heartbeat, leg pain or swelling, and decreased activity tolerance. Respiratory: Denies breathing difficulty, shortness of breath, wheezing, cough, and sputum. GI: Denies change in appetite, heartburn, nausea, vomiting, constipation, diarrhea, and blood in stool. GU: Denies difficulty urinating, pain with urinating, urgency, frequency, blood in urine.   Objective:   Temp:  [97.5 F (36.4 C)-98.6 F (37 C)] 97.5 F (36.4 C) (04/07 0802) Pulse Rate:  [75-81] 76 (04/07 0802) Resp:  [14-20] 14 (04/07 0802) BP: (117-139)/(65-97) 139/94 (04/07 0802) SpO2:  [95 %-100 %] 100 % (04/07 0802)     Height: 5\' 4"  (162.6 cm) Weight: 65.7 kg BMI (Calculated): 24.85   Intake/Output this shift:   Intake/Output Summary (Last 24 hours) at 10/28/2022 0906 Last data filed at 10/27/2022 2300 Gross per 24 hour  Intake 1379.66 ml  Output --  Net 1379.66 ml    Constitutional :  alert, cooperative, appears stated age, and no distress  Respiratory:  clear to auscultation bilaterally  Cardiovascular:  regular rate and rhythm  Gastrointestinal: Soft, no guarding, minor TTP suprapubic region .   Skin: Cool and moist.   Psychiatric: Normal affect, non-agitated, not confused       LABS:     Latest Ref Rng & Units 10/27/2022    6:04 AM 10/26/2022    5:45 PM 10/25/2022    3:39 PM  CMP  Glucose 70 - 99 mg/dL 96  92  388   BUN 8 - 23 mg/dL 11  13  18    Creatinine 0.44 - 1.00 mg/dL 8.28  0.03  4.91   Sodium 135 - 145 mmol/L 142  137  145   Potassium 3.5 - 5.1 mmol/L 3.9  3.5  4.2   Chloride 98 - 111 mmol/L 107  98  109   CO2 22 - 32 mmol/L 26  27  21    Calcium 8.9  - 10.3 mg/dL 8.9  79.1  9.9   Total Protein 6.5 - 8.1 g/dL  8.1  7.0   Total Bilirubin 0.3 - 1.2 mg/dL  0.6  <5.0   Alkaline Phos 38 - 126 U/L  132  159   AST 15 - 41 U/L  28  43   ALT 0 - 44 U/L  54  65       Latest Ref Rng & Units 10/27/2022    6:04 AM 10/26/2022    5:45 PM 10/25/2022    3:39 PM  CBC  WBC 4.0 - 10.5 K/uL 4.8  7.5  7.2   Hemoglobin 12.0 - 15.0 g/dL 56.9  79.4  80.1   Hematocrit 36.0 - 46.0 % 38.4  44.8  44.2   Platelets 150 - 400 K/uL 286  351  340     RADS: N/a Assessment:   Diverticulitis with fluid collection noted.  Clinically continues to do well.  IR unable to drain the fluid collection.  Continue IV antibiotics to ensure no recurrence of symptoms.  Slight increase in pain today Will advance to regular to see if any significant worsening.  Increase stooling noted, so TTP could be from that as well.    If no improvement in pain, may need  to repeat CT tomorrow.  Continue IV abx while inhouse  labs/images/medications/previous chart entries reviewed personally and relevant changes/updates noted above.

## 2022-10-28 NOTE — Assessment & Plan Note (Signed)
With abscess, unfortunately not amenable for drain placement but clinically seems improving. Continue Zosyn per pharmacy Continue diet and monitor Might need to repeat CT abdomen tomorrow morning if pain continued to get worse

## 2022-10-28 NOTE — Assessment & Plan Note (Signed)
Most likely secondary to diverticulitis-mild worsening today with regular diet Left lower quadrant abdominal pain for 6 weeks Patient was prescribed nitrofurantoin on 09/26/2022 Patient was seen on 08/29/2019 for for dysuria and was prescribed cephalexin 250 mg p.o. twice daily for 5 days Minimal improvement after taking the above antibiotics outpatient

## 2022-10-28 NOTE — Progress Notes (Signed)
Interventional Radiology Brief Note:  72 year old female presented to [facility] ED with abdominal pain.  She is found to have diverticulitis with abscess.  IR consulted for possible aspiration and drainage. Imaging reviewed by Dr. Bryn Gulling who notes the collection is not amenable to percutaneous drainage.   No procedure planned in IR.  Order for IR Eval canceled.  Please reconsult if needed.   Loyce Dys, MS RD PA-C

## 2022-10-28 NOTE — Progress Notes (Signed)
Progress Note   Patient: Patricia Oliver:[id number] DOB: January 02, 1951 DOA: 10/26/2022     2 DOS: the patient was seen and examined on 10/28/2022   Brief hospital course: Ms. Kimberlynn Blandon is a 72 year old female with history of seasonal allergies, hyperlipidemia, insomnia, GERD, who presents to emergency department from GI clinic for chief concerns of diverticulitis with possible abscess.  Initial vitals show temperature of 98.1, respiration rate of 16, heart rate 95, blood pressure 145/94, SpO2 of 97% on room air.  Outpatient labs from 10/25/2022: Serum sodium is 145, potassium 4.2, chloride 109, bicarb 21, BUN of 18, serum creatinine 0.85, EGFR 73, nonfasting blood glucose 106, WBC 7.2, hemoglobin 13.7, platelets 340.  UA negative  AST elevated at 43, ALT 63, alk phos elevated at 159. CT abdomen with concern of acute sigmoid colon diverticulitis and and concern of adjacent fluid collection that could be an abscess.  ED treatment Zosyn, LR 1 L bolus.  General surgery was consulted and they were recommending IR to put drain-IR was also consulted by admitting provider.  4/6: Vital and labs stable.  Pain improving.  Her possible abscess is not very amenable for drain placement per IR.  Clinically seems improving-started on diet.  4/7: Mild worsening of pain with advancement in diet this morning.  Having small bowel movements.  General surgery might need to repeat CT abdomen tomorrow morning if pain continued to get worse.  Assessment and Plan: * Diverticulitis With abscess, unfortunately not amenable for drain placement but clinically seems improving. Continue Zosyn per pharmacy Continue diet and monitor Might need to repeat CT abdomen tomorrow morning if pain continued to get worse  Abdominal pain Most likely secondary to diverticulitis-mild worsening today with regular diet Left lower quadrant abdominal pain for 6 weeks Patient was prescribed nitrofurantoin on 09/26/2022 Patient was seen on  08/29/2019 for for dysuria and was prescribed cephalexin 250 mg p.o. twice daily for 5 days Minimal improvement after taking the above antibiotics outpatient  Allergic rhinitis As needed Astelin 1 spray versus fluticasone 2 spray each nare as needed were resumed Montelukast 10 mg nightly resumed  Esophageal reflux Famotidine twice daily as needed for heartburn/reflux  HLD (hyperlipidemia) Holding home simvastatin at this time due to elevated LFTs  Primary osteoarthritis of left knee Home Celebrex 200 mg daily resumed for 10/27/2022  Insomnia Home Ambien 5 mg nightly as needed for sleep resumed   Subjective: Patient was experiencing mild crampy lower abdominal pain after eating regular diet for breakfast.  No nausea or vomiting.  Had few small bowel movements  Physical Exam: Vitals:   10/27/22 1556 10/27/22 1951 10/28/22 0344 10/28/22 0802  BP: 132/84 (!) 133/97 117/65 (!) 139/94  Pulse: 75 81 78 76  Resp: 20 18 18 14   Temp: 98.4 F (36.9 C) 98.6 F (37 C) 97.8 F (36.6 C) (!) 97.5 F (36.4 C)  TempSrc:    Oral  SpO2: 97% 95% 96% 100%  Weight:      Height:       General.  Well-developed elderly lady, in no acute distress. Pulmonary.  Lungs clear bilaterally, normal respiratory effort. CV.  Regular rate and rhythm, no JVD, rub or murmur. Abdomen.  Soft, nontender, nondistended, BS positive. CNS.  Alert and oriented .  No focal neurologic deficit. Extremities.  No edema, no cyanosis, pulses intact and symmetrical. Psychiatry.  Judgment and insight appears normal. .   Data Reviewed: Prior data reviewed  Family Communication: Discussed with patient  Disposition: Status is:  Inpatient Remains inpatient appropriate because: Severity of illness  Planned Discharge Destination: Home  Time spent: 44 minutes  This record has been created using Conservation officer, historic buildings. Errors have been sought and corrected,but may not always be located. Such creation errors do not  reflect on the standard of care.   Author: Arnetha Courser, MD 10/28/2022 1:45 PM  For on call review [email or web address].

## 2022-10-29 ENCOUNTER — Other Ambulatory Visit: Payer: Self-pay

## 2022-10-29 ENCOUNTER — Inpatient Hospital Stay: Payer: Medicare PPO

## 2022-10-29 ENCOUNTER — Telehealth: Payer: Medicare PPO | Admitting: Gastroenterology

## 2022-10-29 DIAGNOSIS — K5792 Diverticulitis of intestine, part unspecified, without perforation or abscess without bleeding: Secondary | ICD-10-CM | POA: Diagnosis not present

## 2022-10-29 DIAGNOSIS — M1712 Unilateral primary osteoarthritis, left knee: Secondary | ICD-10-CM | POA: Diagnosis not present

## 2022-10-29 DIAGNOSIS — R1032 Left lower quadrant pain: Secondary | ICD-10-CM | POA: Diagnosis not present

## 2022-10-29 DIAGNOSIS — K5720 Diverticulitis of large intestine with perforation and abscess without bleeding: Secondary | ICD-10-CM | POA: Diagnosis not present

## 2022-10-29 DIAGNOSIS — Z8719 Personal history of other diseases of the digestive system: Secondary | ICD-10-CM

## 2022-10-29 LAB — CBC
HCT: 39 % (ref 36.0–46.0)
Hemoglobin: 12 g/dL (ref 12.0–15.0)
MCH: 23.9 pg — ABNORMAL LOW (ref 26.0–34.0)
MCHC: 30.8 g/dL (ref 30.0–36.0)
MCV: 77.7 fL — ABNORMAL LOW (ref 80.0–100.0)
Platelets: 306 10*3/uL (ref 150–400)
RBC: 5.02 MIL/uL (ref 3.87–5.11)
RDW: 17.9 % — ABNORMAL HIGH (ref 11.5–15.5)
WBC: 4.6 10*3/uL (ref 4.0–10.5)
nRBC: 0 % (ref 0.0–0.2)

## 2022-10-29 LAB — COMPREHENSIVE METABOLIC PANEL
ALT: 26 U/L (ref 0–44)
AST: 21 U/L (ref 15–41)
Albumin: 3.3 g/dL — ABNORMAL LOW (ref 3.5–5.0)
Alkaline Phosphatase: 91 U/L (ref 38–126)
Anion gap: 5 (ref 5–15)
BUN: 16 mg/dL (ref 8–23)
CO2: 25 mmol/L (ref 22–32)
Calcium: 9.1 mg/dL (ref 8.9–10.3)
Chloride: 109 mmol/L (ref 98–111)
Creatinine, Ser: 0.8 mg/dL (ref 0.44–1.00)
GFR, Estimated: >60 mL/min (ref >60)
Glucose, Bld: 96 mg/dL (ref 70–99)
Potassium: 4 mmol/L (ref 3.5–5.1)
Sodium: 139 mmol/L (ref 135–145)
Total Bilirubin: 0.5 mg/dL (ref 0.3–1.2)
Total Protein: 6.7 g/dL (ref 6.5–8.1)

## 2022-10-29 MED ORDER — IOHEXOL 9 MG/ML PO SOLN
500.0000 mL | Freq: Once | ORAL | Status: AC | PRN
  Administered 2022-10-29: 500 mL via ORAL

## 2022-10-29 MED ORDER — ONDANSETRON HCL 4 MG PO TABS
4.0000 mg | ORAL_TABLET | Freq: Four times a day (QID) | ORAL | 0 refills | Status: DC | PRN
  Filled 2022-10-29: qty 20, 5d supply, fill #0

## 2022-10-29 MED ORDER — ENOXAPARIN SODIUM 40 MG/0.4ML IJ SOSY: 40.0000 mg | PREFILLED_SYRINGE | INTRAMUSCULAR | Status: DC

## 2022-10-29 MED ORDER — OXYCODONE HCL 5 MG PO TABS
5.0000 mg | ORAL_TABLET | Freq: Four times a day (QID) | ORAL | 0 refills | Status: DC | PRN
  Filled 2022-10-29: qty 20, 5d supply, fill #0

## 2022-10-29 MED ORDER — IOHEXOL 300 MG/ML  SOLN
100.0000 mL | Freq: Once | INTRAMUSCULAR | Status: AC | PRN
  Administered 2022-10-29: 100 mL via INTRAVENOUS

## 2022-10-29 MED ORDER — AMOXICILLIN-POT CLAVULANATE 875-125 MG PO TABS
1.0000 | ORAL_TABLET | Freq: Two times a day (BID) | ORAL | 0 refills | Status: AC
  Filled 2022-10-29: qty 10, 5d supply, fill #0

## 2022-10-29 NOTE — TOC CM/SW Note (Signed)
Transition of Care [facility]) Screening Note   Patient Details  Name: Patricia Oliver Date of Birth: 1951/03/04   Transition of Care [facility]) CM/SW Contact:    Margarito Liner, LCSW Phone Number: 10/29/2022, 10:02 AM    Transition of Care Department [facility]) has reviewed patient and no TOC needs have been identified at this time. We will continue to monitor patient advancement through interdisciplinary progression rounds. If new patient transition needs arise, please place a TOC consult.

## 2022-10-29 NOTE — Care Management Important Message (Signed)
Important Message  Patient Details  Name: Patricia Oliver MRN: [id number] Date of Birth: Nov 23, 1950   Medicare Important Message Given:  Yes  Patient in restroom upon time of visit.  Copy of Medicare IM left on bedside tray for reference.   Johnell Comings 10/29/2022, 11:07 AM

## 2022-10-29 NOTE — Progress Notes (Signed)
Pt discharged per MD order. IV removed. Discharge instructions reviewed with pt. Pt verbalized understanding. All questions answered to pt satisfaction. Pt taken to personal vehicle in wheelchair by staff.  

## 2022-10-29 NOTE — Discharge Summary (Signed)
Physician Discharge Summary   Patient: Patricia Oliver MRN: [id number] DOB: July 03, 1951  Admit date:     10/26/2022  Discharge date: 10/29/22  Discharge Physician: Arnetha Courser   PCP: Kandyce Rud, MD   Recommendations at discharge:  Please obtain CBC and BMP in 1 week Follow-up with general surgery within a week Follow-up with primary care provider  Discharge Diagnoses: Principal Problem:   Diverticulitis Active Problems:   Abdominal pain   Allergic rhinitis   Esophageal reflux   HLD (hyperlipidemia)   Primary osteoarthritis of left knee   Insomnia   Diverticulitis of large intestine with abscess without bleeding   Hospital Course: [name] is a 72 year old female with history of seasonal allergies, hyperlipidemia, insomnia, GERD, who presents to emergency department from GI clinic for chief concerns of diverticulitis with possible abscess.  Initial vitals show temperature of 98.1, respiration rate of 16, heart rate 95, blood pressure 145/94, SpO2 of 97% on room air.  Outpatient labs from 10/25/2022: Serum sodium is 145, potassium 4.2, chloride 109, bicarb 21, BUN of 18, serum creatinine 0.85, EGFR 73, nonfasting blood glucose 106, WBC 7.2, hemoglobin 13.7, platelets 340.  UA negative  AST elevated at 43, ALT 63, alk phos elevated at 159. CT abdomen with concern of acute sigmoid colon diverticulitis and and concern of adjacent fluid collection that could be an abscess.  ED treatment Zosyn, LR 1 L bolus.  General surgery was consulted and they were recommending IR to put drain-IR was also consulted by admitting provider.  4/6: Vital and labs stable.  Pain improving.  Her possible abscess is not very amenable for drain placement per IR.  Clinically seems improving-started on diet.  4/7: Mild worsening of pain with advancement in diet this morning.  Having small bowel movements.  General surgery might need to repeat CT abdomen tomorrow morning if pain continued to get  worse.  4/8: Vitals and labs stable.  Tolerating regular diet, having some intermittent cramping.  Repeat CT abdomen today was without any new changes.  Stable diverticulitis and abscess with no concerning features.  Hoping to resolve with time and antibiotics. Patient received Zosyn while in the hospital and is being discharged on 5 more days of Augmentin.  He was also given oxycodone to be used only as needed for severe pain. No pain today and she had a normal bowel movement.  Tolerating regular food well.  She will continue the rest of her home medications and need to have a close follow-up with her providers.  Assessment and Plan: * Diverticulitis With abscess, unfortunately not amenable for drain placement but clinically seems improving. Continue Zosyn per pharmacy Continue diet and monitor Might need to repeat CT abdomen tomorrow morning if pain continued to get worse  Abdominal pain Most likely secondary to diverticulitis-mild worsening today with regular diet Left lower quadrant abdominal pain for 6 weeks Patient was prescribed nitrofurantoin on 09/26/2022 Patient was seen on 08/29/2019 for for dysuria and was prescribed cephalexin 250 mg p.o. twice daily for 5 days Minimal improvement after taking the above antibiotics outpatient  Allergic rhinitis As needed Astelin 1 spray versus fluticasone 2 spray each nare as needed were resumed Montelukast 10 mg nightly resumed  Esophageal reflux Famotidine twice daily as needed for heartburn/reflux  HLD (hyperlipidemia) Holding home simvastatin at this time due to elevated LFTs  Primary osteoarthritis of left knee Home Celebrex 200 mg daily resumed for 10/27/2022  Insomnia Home Ambien 5 mg nightly as needed for sleep resumed  Pain control - [employer] Controlled Substance Reporting System database was reviewed. and patient was instructed, not to drive, operate heavy machinery, perform activities at heights, swimming or  participation in water activities or provide baby-sitting services while on Pain, Sleep and Anxiety Medications; until their outpatient Physician has advised to do so again. Also recommended to not to take more than prescribed Pain, Sleep and Anxiety Medications.  Consultants: General surgery.  Interventional radiology Procedures performed: None Disposition: Home Diet recommendation:  Discharge Diet Orders (From admission, onward)     Start     Ordered   10/29/22 0000  Diet - low sodium heart healthy        10/29/22 1430           Regular diet DISCHARGE MEDICATION: Allergies as of 10/29/2022       Reactions   Ciprofloxacin Swelling, Rash   Elemental Sulfur Itching, Swelling, Rash        Medication List     STOP taking these medications    nitrofurantoin (macrocrystal-monohydrate) 100 MG capsule Commonly known as: MACROBID       TAKE these medications    amoxicillin-clavulanate 875-125 MG tablet Commonly known as: AUGMENTIN Take 1 tablet by mouth 2 (two) times daily for 5 days.   azelastine 0.05 % ophthalmic solution Commonly known as: OPTIVAR Place 1 drop into both eyes once daily   azelastine 0.1 % nasal spray Commonly known as: ASTELIN PLACE 1 SPRAY INTO BOTH NOSTRILS ONCE DAILY   celecoxib 200 MG capsule Commonly known as: CELEBREX Take 1 capsule (200 mg total) by mouth 2 (two) times daily. What changed: when to take this   CITRACAL + D PO Take by mouth.   dicyclomine 10 MG capsule Commonly known as: BENTYL Take 1 capsule (10 mg total) by mouth 4 (four) times daily before meals and nightly   famotidine 40 MG tablet Commonly known as: PEPCID Take 1 tablet (40 mg total) by mouth 2 (two) times daily as needed for Heartburn   fluticasone 50 MCG/ACT nasal spray Commonly known as: FLONASE Place 2 sprays into both nostrils daily.   glucosamine-chondroitin 500-400 MG tablet Take 1 tablet by mouth daily.   guaifenesin 400 MG Tabs tablet Commonly  known as: HUMIBID E Take 400 mg by mouth daily.   Inulin 2 g Chew Chew by mouth.   montelukast 10 MG tablet Commonly known as: SINGULAIR TAKE 1 TABLET BY MOUTH ONCE DAILY What changed: Another medication with the same name was removed. Continue taking this medication, and follow the directions you see here.   ondansetron 4 MG tablet Commonly known as: ZOFRAN Take 1 tablet (4 mg total) by mouth every 6 (six) hours as needed for nausea.   oxyCODONE 5 MG immediate release tablet Commonly known as: Oxy IR/ROXICODONE Take 1 tablet (5 mg total) by mouth every 6 (six) hours as needed for moderate pain.   simvastatin 40 MG tablet Commonly known as: ZOCOR Take 1 tablet (40 mg total) by mouth once daily What changed:  how much to take how to take this when to take this   vitamin C 100 MG tablet Take 400 mg by mouth daily.   vitamin E 180 MG (400 UNITS) capsule Take 400 Units by mouth daily.   zolpidem 5 MG tablet Commonly known as: AMBIEN TAKE 1 TABLET BY MOUTH NIGHTLY AS NEEDED FOR SLEEP        Follow-up Information     Sakai, Isami, DO Follow up in 1 week(s).  Specialty: Surgery Why: diverticulitis Contact information: [address] [place] [address] [phone]         Kandyce Rud, MD. Schedule an appointment as soon as possible for a visit in 1 week(s).   Specialty: Family Medicine Contact information: [address] [facility] [place] [address] [phone]                Discharge Exam: Ceasar Mons Weights   10/26/22 1644  Weight: 65.7 kg   General.  Well-developed lady, in no acute distress. Pulmonary.  Lungs clear bilaterally, normal respiratory effort. CV.  Regular rate and rhythm, no JVD, rub or murmur. Abdomen.  Soft, nontender, nondistended, BS positive. CNS.  Alert and oriented .  No focal neurologic deficit. Extremities.  No edema, no cyanosis, pulses intact and  symmetrical. Psychiatry.  Judgment and insight appears normal.   Condition at discharge: stable  The results of significant diagnostics from this hospitalization (including imaging, microbiology, ancillary and laboratory) are listed below for reference.   Imaging Studies: CT ABDOMEN PELVIS W CONTRAST  Result Date: 10/29/2022 CLINICAL DATA:  Diverticulitis. EXAM: CT ABDOMEN AND PELVIS WITH CONTRAST TECHNIQUE: Multidetector CT imaging of the abdomen and pelvis was performed using the standard protocol following bolus administration of intravenous contrast. RADIATION DOSE REDUCTION: This exam was performed according to the departmental dose-optimization program which includes automated exposure control, adjustment of the mA and/or kV according to patient size and/or use of iterative reconstruction technique. CONTRAST:  OMNIPAQUE IOHEXOL 300 MG/ML  SOLN COMPARISON:  10/26/2022.  08/03/2016. FINDINGS: Lower chest: Unremarkable. Hepatobiliary: Small hypodensity posterior right liver is stable since 08/03/2016 consistent with benign etiology such as a cyst. Small area of low attenuation in the anterior liver, adjacent to the falciform ligament, is in a characteristic location for focal fatty deposition. There is no evidence for gallstones, gallbladder wall thickening, or pericholecystic fluid. No intrahepatic or extrahepatic biliary dilation. Pancreas: No focal mass lesion. No dilatation of the main duct. No intraparenchymal cyst. No peripancreatic edema. Spleen: No splenomegaly. No focal mass lesion. Adrenals/Urinary Tract: No adrenal nodule or mass. Kidneys unremarkable. No evidence for hydroureter. The urinary bladder appears normal for the degree of distention. Stomach/Bowel: Tiny hiatal hernia evident. Stomach otherwise unremarkable. No gastric wall thickening. No evidence of outlet obstruction. Duodenum is normally positioned as is the ligament of Treitz. Duodenal diverticulum noted. No small bowel  wall thickening. No small bowel dilatation. The terminal ileum is normal. The appendix is not well visualized, but there is no edema or inflammation in the region of the cecum. Left colonic diverticular disease noted with similar changes of diverticulitis involving the sigmoid segment. As noted previously, there is a 4.2 cm fluid collection just inferior to the inflamed segment of sigmoid colon without rim enhancement. No extraluminal gas to suggest perforation. Vascular/Lymphatic: There is mild atherosclerotic calcification of the abdominal aorta without aneurysm. There is no gastrohepatic or hepatoduodenal ligament lymphadenopathy. No retroperitoneal or mesenteric lymphadenopathy. No pelvic sidewall lymphadenopathy. Reproductive: Hysterectomy.  There is no adnexal mass. Other: No intraperitoneal free fluid. Musculoskeletal: No worrisome lytic or sclerotic osseous abnormality. Several sclerotic lesions in the right hemipelvis are probably bone islands. IMPRESSION: 1. Similar appearance of sigmoid diverticulitis with stable 4.2 cm fluid collection just inferior to the inflamed segment of sigmoid colon without rim enhancement. No extraluminal gas to suggest perforation. 2. Tiny hiatal hernia. 3. Aortic atherosclerosis (ICD10-I70.0). Electronically Signed   By: Kennith Center M.D.   On: 10/29/2022  13:26   CT Abdomen Pelvis W Contrast  Result Date: 10/26/2022 CLINICAL DATA:  Abnormal liver function tests. Abdominal pain in the left lower quadrant EXAM: CT ABDOMEN AND PELVIS WITH CONTRAST TECHNIQUE: Multidetector CT imaging of the abdomen and pelvis was performed using the standard protocol following bolus administration of intravenous contrast. RADIATION DOSE REDUCTION: This exam was performed according to the departmental dose-optimization program which includes automated exposure control, adjustment of the mA and/or kV according to patient size and/or use of iterative reconstruction technique. CONTRAST:   OMNIPAQUE IOHEXOL 300 MG/ML  SOLN COMPARISON:  08/03/2016 FINDINGS: Lower chest: Mild cardiomegaly.  Small type 1 hiatal hernia. Hepatobiliary: Stable 0.9 cm hemangioma posteriorly in the right hepatic lobe on image 18 series 2. Possible adjacent flash filling 4 mm hemangioma posteriorly in the right hepatic lobe on image 15 series 2. No worrisome hepatic lesions identified. Gallbladder unremarkable. No biliary dilatation. A cause for the patient's abnormal liver function tests is not identified. Small amount of hepatic steatosis adjacent to the falciform ligament. Tiny vascular shunt versus hemangioma in the dome of the left hepatic lobe on image 23 series 5. Pancreas: Unremarkable Spleen: Unremarkable Adrenals/Urinary Tract: Unremarkable Stomach/Bowel: Notable degree of acute sigmoid colon diverticulitis involving a 5.5 cm segment of bowel demonstrating substantial abnormal wall thickening and adjacent mesenteric stranding (image 66, series 2). No extraluminal gas is observed but there is an adjacent 4.2 by 1.7 by 3.6 cm fluid collection just below the inflamed portion of the sigmoid colon which currently does not have a thick enhancing wall. This could reflect sterile local fluid collection related to inflammation, or incipient abscess. Vascular/Lymphatic: Unremarkable Reproductive: Uterus absent.  Adnexa unremarkable. Other: No supplemental non-categorized findings. Musculoskeletal: Leftward tilt of the lower anterior sternum. Lower lumbar spondylosis and degenerative disc disease. IMPRESSION: 1. Acute sigmoid colon diverticulitis involving a 5.5 cm segment of bowel demonstrating substantial abnormal wall thickening and adjacent mesenteric stranding. No extraluminal gas is observed but there is an adjacent 4.2 by 1.7 by 3.6 cm fluid collection just below the inflamed portion of the sigmoid colon which currently does not have a thick enhancing wall. This could reflect sterile local fluid collection related to  inflammation, or incipient abscess. 2. Mild cardiomegaly. 3. Small type 1 hiatal hernia. 4. Lower lumbar spondylosis and degenerative disc disease. Electronically Signed   By: Gaylyn Rong M.D.   On: 10/26/2022 15:52    Microbiology: Results for orders placed or performed in visit on 08/30/16  Microscopic Examination     Status: Abnormal   Collection Time: 08/30/16  1:03 PM   URINE  Result Value Ref Range Status   WBC, UA 0-5 0 - 5 /hpf Final   RBC, UA 0-2 0 - 2 /hpf Final   Epithelial Cells (non renal) >10 (A) 0 - 10 /hpf Final   Bacteria, UA None seen None seen/Few Final    Labs: CBC: Recent Labs  Lab 10/25/22 1539 10/26/22 1745 10/27/22 0604 10/29/22 0618  WBC 7.2 7.5 4.8 4.6  NEUTROABS 5.3 5.4  --   --   HGB 13.5 13.8 11.9* 12.0  HCT 44.2 44.8 38.4 39.0  MCV 79 77.4* 77.9* 77.7*  PLT 340 351 286 306   Basic Metabolic Panel: Recent Labs  Lab 10/25/22 1539 10/26/22 1745 10/27/22 0604 10/29/22 0618  NA 145* 137 142 139  K 4.2 3.5 3.9 4.0  CL 109* 98 107 109  CO2 21 27 26 25   GLUCOSE 106* 92 96 96  BUN 18  13 11 16   CREATININE 0.85 0.70 0.60 0.80  CALCIUM 9.9 10.6* 8.9 9.1   Liver Function Tests: Recent Labs  Lab 10/25/22 1539 10/26/22 1745 10/29/22 0618  AST 43* 28 21  ALT 65* 54* 26  ALKPHOS 159* 132* 91  BILITOT <0.2 0.6 0.5  PROT 7.0 8.1 6.7  ALBUMIN 4.0 4.0 3.3*   CBG: No results for input(s): "GLUCAP" in the last 168 hours.  Discharge time spent: greater than 30 minutes.  This record has been created using Conservation officer, historic buildings. Errors have been sought and corrected,but may not always be located. Such creation errors do not reflect on the standard of care.   Signed: Arnetha Courser, MD [facility] 10/29/2022

## 2022-10-29 NOTE — Progress Notes (Signed)
Subjective:  CC: Patricia Oliver is a 72 y.o. female  Hospital stay day 3,   diverticulitis  HPI: No acute issues overnight. Regular diet now but still feelling occasional bouts of pain  ROS:  General: Denies weight loss, weight gain, fatigue, fevers, chills, and night sweats. Heart: Denies chest pain, palpitations, racing heart, irregular heartbeat, leg pain or swelling, and decreased activity tolerance. Respiratory: Denies breathing difficulty, shortness of breath, wheezing, cough, and sputum. GI: Denies change in appetite, heartburn, nausea, vomiting, constipation, diarrhea, and blood in stool. GU: Denies difficulty urinating, pain with urinating, urgency, frequency, blood in urine.   Objective:   Temp:  [97.7 F (36.5 C)-98.3 F (36.8 C)] 97.8 F (36.6 C) (04/08 0807) Pulse Rate:  [65-77] 67 (04/08 0807) Resp:  [16-18] 18 (04/08 0807) BP: (114-136)/(79-89) 126/85 (04/08 0807) SpO2:  [96 %-99 %] 99 % (04/08 0807)     Height: 5\' 4"  (162.6 cm) Weight: 65.7 kg BMI (Calculated): 24.85   Intake/Output this shift:   Intake/Output Summary (Last 24 hours) at 10/29/2022 0850 Last data filed at 10/29/2022 0245 Gross per 24 hour  Intake 235.39 ml  Output --  Net 235.39 ml    Constitutional :  alert, cooperative, appears stated age, and no distress  Respiratory:  clear to auscultation bilaterally  Cardiovascular:  regular rate and rhythm  Gastrointestinal: Soft, no guarding, minor TTP suprapubic region .   Skin: Cool and moist.   Psychiatric: Normal affect, non-agitated, not confused       LABS:     Latest Ref Rng & Units 10/29/2022    6:18 AM 10/27/2022    6:04 AM 10/26/2022    5:45 PM  CMP  Glucose 70 - 99 mg/dL 96  96  92   BUN 8 - 23 mg/dL 16  11  13    Creatinine 0.44 - 1.00 mg/dL 0.93  8.18  2.99   Sodium 135 - 145 mmol/L 139  142  137   Potassium 3.5 - 5.1 mmol/L 4.0  3.9  3.5   Chloride 98 - 111 mmol/L 109  107  98   CO2 22 - 32 mmol/L 25  26  27    Calcium 8.9 - 10.3 mg/dL  9.1  8.9  37.1   Total Protein 6.5 - 8.1 g/dL 6.7   8.1   Total Bilirubin 0.3 - 1.2 mg/dL 0.5   0.6   Alkaline Phos 38 - 126 U/L 91   132   AST 15 - 41 U/L 21   28   ALT 0 - 44 U/L 26   54       Latest Ref Rng & Units 10/29/2022    6:18 AM 10/27/2022    6:04 AM 10/26/2022    5:45 PM  CBC  WBC 4.0 - 10.5 K/uL 4.6  4.8  7.5   Hemoglobin 12.0 - 15.0 g/dL 69.6  78.9  38.1   Hematocrit 36.0 - 46.0 % 39.0  38.4  44.8   Platelets 150 - 400 K/uL 306  286  351     RADS: N/a Assessment:   Diverticulitis with fluid collection noted.  Clinically continues to do well but still with pain, so will proceed with repeat CT to reassess  labs/images/medications/previous chart entries reviewed personally and relevant changes/updates noted above.

## 2022-11-01 ENCOUNTER — Ambulatory Visit: Payer: Medicare PPO

## 2022-11-01 ENCOUNTER — Other Ambulatory Visit: Payer: Self-pay

## 2022-11-01 MED ORDER — DICYCLOMINE HCL 10 MG PO CAPS
10.0000 mg | ORAL_CAPSULE | Freq: Three times a day (TID) | ORAL | 1 refills | Status: DC
  Filled 2022-11-01: qty 360, 90d supply, fill #0

## 2022-11-05 ENCOUNTER — Telehealth: Payer: Medicare PPO | Admitting: Gastroenterology

## 2022-11-08 ENCOUNTER — Other Ambulatory Visit: Payer: Self-pay

## 2022-11-08 MED ORDER — FLUCONAZOLE 150 MG PO TABS
150.0000 mg | ORAL_TABLET | Freq: Once | ORAL | 0 refills | Status: AC
  Filled 2022-11-08: qty 1, 1d supply, fill #0

## 2022-11-09 ENCOUNTER — Other Ambulatory Visit: Payer: Self-pay

## 2022-11-09 MED ORDER — FAMOTIDINE 40 MG PO TABS
ORAL_TABLET | ORAL | 1 refills | Status: DC
  Filled 2022-11-09: qty 180, 90d supply, fill #0
  Filled 2023-02-11 (×2): qty 180, 90d supply, fill #1

## 2022-11-13 ENCOUNTER — Other Ambulatory Visit: Payer: Self-pay

## 2022-11-13 MED ORDER — AZELASTINE HCL 0.05 % OP SOLN
1.0000 [drp] | Freq: Every day | OPHTHALMIC | 5 refills | Status: DC
  Filled 2022-11-13: qty 6, 60d supply, fill #0

## 2022-11-29 ENCOUNTER — Other Ambulatory Visit: Payer: Self-pay

## 2022-11-29 MED ORDER — ESTRADIOL 0.1 MG/GM VA CREA
TOPICAL_CREAM | VAGINAL | 3 refills | Status: DC
  Filled 2022-11-29: qty 42.5, 60d supply, fill #0

## 2022-12-04 ENCOUNTER — Other Ambulatory Visit: Payer: Self-pay

## 2022-12-06 ENCOUNTER — Ambulatory Visit
Admission: RE | Admit: 2022-12-06 | Discharge: 2022-12-06 | Disposition: A | Discharge status: Home / Self Care | Payer: Medicare PPO | Source: Ambulatory Visit | Attending: Surgery | Admitting: Surgery

## 2022-12-06 ENCOUNTER — Other Ambulatory Visit: Payer: Self-pay | Admitting: Surgery

## 2022-12-06 ENCOUNTER — Other Ambulatory Visit: Payer: Self-pay

## 2022-12-06 DIAGNOSIS — K5792 Diverticulitis of intestine, part unspecified, without perforation or abscess without bleeding: Secondary | ICD-10-CM | POA: Diagnosis not present

## 2022-12-06 MED ORDER — IOHEXOL 300 MG/ML  SOLN
100.0000 mL | Freq: Once | INTRAMUSCULAR | Status: AC | PRN
  Administered 2022-12-06: 100 mL via INTRAVENOUS

## 2022-12-06 MED ORDER — AMOXICILLIN-POT CLAVULANATE 875-125 MG PO TABS
875.0000 mg | ORAL_TABLET | Freq: Two times a day (BID) | ORAL | 0 refills | Status: DC
  Filled 2022-12-06: qty 10, 5d supply, fill #0

## 2022-12-07 ENCOUNTER — Other Ambulatory Visit: Payer: Self-pay

## 2022-12-18 ENCOUNTER — Other Ambulatory Visit: Payer: Self-pay

## 2022-12-18 MED ORDER — MONTELUKAST SODIUM 10 MG PO TABS
10.0000 mg | ORAL_TABLET | Freq: Every day | ORAL | 3 refills | Status: DC
  Filled 2022-12-18: qty 90, 90d supply, fill #0

## 2022-12-18 MED ORDER — FLUTICASONE PROPIONATE 50 MCG/ACT NA SUSP
2.0000 | Freq: Every day | NASAL | 1 refills | Status: DC
  Filled 2022-12-18: qty 48, 90d supply, fill #0

## 2022-12-31 ENCOUNTER — Other Ambulatory Visit: Payer: Self-pay

## 2022-12-31 ENCOUNTER — Telehealth (INDEPENDENT_AMBULATORY_CARE_PROVIDER_SITE_OTHER): Payer: Medicare PPO | Admitting: Gastroenterology

## 2022-12-31 ENCOUNTER — Ambulatory Visit: Payer: Medicare PPO | Admitting: Gastroenterology

## 2022-12-31 DIAGNOSIS — K5720 Diverticulitis of large intestine with perforation and abscess without bleeding: Secondary | ICD-10-CM

## 2022-12-31 DIAGNOSIS — Z8719 Personal history of other diseases of the digestive system: Secondary | ICD-10-CM | POA: Diagnosis not present

## 2022-12-31 MED ORDER — SUTAB 1479-225-188 MG PO TABS
ORAL_TABLET | ORAL | 0 refills | Status: DC
  Filled 2022-12-31 – 2023-02-19 (×5): qty 24, 1d supply, fill #0

## 2022-12-31 MED ORDER — CELECOXIB 200 MG PO CAPS
200.0000 mg | ORAL_CAPSULE | Freq: Two times a day (BID) | ORAL | 1 refills | Status: DC
  Filled 2022-12-31: qty 130, 65d supply, fill #0
  Filled 2023-01-02: qty 50, 25d supply, fill #0
  Filled 2023-04-04: qty 180, 90d supply, fill #1

## 2022-12-31 MED ORDER — AZELASTINE HCL 0.05 % OP SOLN
1.0000 [drp] | Freq: Every day | OPHTHALMIC | 5 refills | Status: DC
  Filled 2022-12-31: qty 6, 60d supply, fill #0
  Filled 2023-03-26: qty 6, 60d supply, fill #1

## 2022-12-31 MED ORDER — FLUTICASONE PROPIONATE 50 MCG/ACT NA SUSP
2.0000 | Freq: Every day | NASAL | 1 refills | Status: DC
  Filled 2022-12-31: qty 48, 90d supply, fill #0

## 2022-12-31 NOTE — Progress Notes (Signed)
Patricia Oliver , MD [address]  [address]  [place], [place] [address]  Main: [phone]  Fax: [phone]   Primary Care Physician: Kandyce Rud, MD  Virtual Visit via Video Note  I connected with patient on 12/31/22 at  1:15 PM EDT by video and verified that I am speaking with the correct person using two identifiers.   I discussed the limitations, risks, security and privacy concerns of performing an evaluation and management service by video  and the availability of in person appointments. I also discussed with the patient that there may be a patient responsible charge related to this service. The patient expressed understanding and agreed to proceed.  Location of Patient: Home Location of Provider: Home Persons involved: Patient and provider only   History of Present Illness: Chief Complaint  Patient presents with   history of diverticulitis    HPI: Patricia Oliver is a 72 y.o. female  Summary of history :   Initially referred and seen on 10/26/2020 for diarrhea  3-4 times a day.  Usually in the morning after she wakes up.  Was consuming sweet tea half a cup of artificial sugar Stevia in 1 gallon daily.  Associated with a lot of gas.  Taking Celebrex 1 tablet daily for many years.   11/21/2020: Colonoscopy: 3 polyps resected in the ascending and sigmoid colon.  Random colon biopsies showed no evidence of microscopic colitis.  The polyps were sessile serrated polyps x2.   in 2022 she had stopped taking her Celebrex, artificial sugars and has noted a significant improvement in her symptoms  03/22/2023: ER visit for left lower quadrant pain.  She has had 3-1 antibiotics given for UTIs recently.    Interval history   01/03/2021-10/25/2022   I performed a CT scan of the abdomen for left lower quadrant pain on 10/26/2022 that showed acute sigmoid diverticulitis thickening stranding and a possible collection. 10/29/2022 CT scan of the abdomen showed similar appearing sigmoid  diverticulitis with stable 4.2 cm collection 12/06/2022 CT scan of the abdomen pelvis with contrast showed sigmoid diverticulitis with collection that had resolved.  She was hospitalized on 10/27/2022 treated with antibiotics it was done amenable to IR drainage.  Discharged and followed up with Dr. Tonna Boehringer as an outpatient on 12/06/2022.  Given a course of Augmentin on 12/06/2022 after a second course of diverticulitis.    Surgery has been discussed , he has left the offer of surgery with her.  12/06/2022 LFTs normal.  Hemoglobin 13.7 g.  Presently she is doing well with no complaints  Current Outpatient Medications  Medication Sig Dispense Refill   amoxicillin-clavulanate (AUGMENTIN) 875-125 MG tablet Take 1 tablet (875 mg total) by mouth every 12 (twelve) hours for 5 days. 10 tablet 0   Ascorbic Acid (VITAMIN C) 100 MG tablet Take 400 mg by mouth daily.     azelastine (ASTELIN) 0.1 % nasal spray PLACE 1 SPRAY INTO BOTH NOSTRILS ONCE DAILY 90 mL 3   azelastine (OPTIVAR) 0.05 % ophthalmic solution Place 1 drop into both eyes once daily 6 mL 5   azelastine (OPTIVAR) 0.05 % ophthalmic solution Place 1 drop into both eyes daily. 6 mL 5   azelastine (OPTIVAR) 0.05 % ophthalmic solution Place 1 drop into both eyes daily. 6 mL 5   Calcium Citrate-Vitamin D (CITRACAL + D PO) Take by mouth.     celecoxib (CELEBREX) 200 MG capsule Take 1 capsule (200 mg total) by mouth 2 (two) times daily. (Patient  taking differently: Take 200 mg by mouth daily.) 180 capsule 1   celecoxib (CELEBREX) 200 MG capsule Take 1 capsule (200 mg total) by mouth 2 (two) times daily. 180 capsule 1   cyanocobalamin (VITAMIN B12) 1000 MCG tablet Take 1,000 mcg by mouth daily.     dicyclomine (BENTYL) 10 MG capsule Take 1 capsule (10 mg total) by mouth 4 (four) times daily before meals and nightly 360 capsule 3   dicyclomine (BENTYL) 10 MG capsule Take 1 capsule (10 mg total) by mouth 4 (four) times daily before meals and nightly 360  capsule 1   estradiol (ESTRACE) 0.1 MG/GM vaginal cream Place pea-sized amount in the vagina nightly X 2 weeks, then use every other night.  Apply the cream digitally and do NOT use the applicator. 42.5 g 3   famotidine (PEPCID) 40 MG tablet Take 1 tablet (40 mg total) by mouth 2 (two) times daily as needed for Heartburn 180 tablet 1   famotidine (PEPCID) 40 MG tablet Take 1 tablet (40 mg total) by mouth 2 (two) times daily as needed for Heartburn 180 tablet 1   fluticasone (FLONASE) 50 MCG/ACT nasal spray Place 2 sprays into both nostrils daily. 48 g 1   fluticasone (FLONASE) 50 MCG/ACT nasal spray Place 2 sprays into both nostrils daily. 48 g 1   fluticasone (FLONASE) 50 MCG/ACT nasal spray Place 2 sprays into both nostrils daily. 48 g 1   Fluticasone Propionate, Inhal, 100 MCG/ACT AEPB      glucosamine-chondroitin 500-400 MG tablet Take 1 tablet by mouth daily.     guaifenesin (HUMIBID E) 400 MG TABS tablet Take 400 mg by mouth daily.     Inulin 2 g CHEW Chew by mouth.     montelukast (SINGULAIR) 10 MG tablet TAKE 1 TABLET BY MOUTH ONCE DAILY 90 tablet 3   montelukast (SINGULAIR) 10 MG tablet Take 1 tablet (10 mg total) by mouth daily. 90 tablet 3   ondansetron (ZOFRAN) 4 MG tablet Take 1 tablet (4 mg total) by mouth every 6 (six) hours as needed for nausea. 20 tablet 0   oxyCODONE (OXY IR/ROXICODONE) 5 MG immediate release tablet Take 1 tablet (5 mg total) by mouth every 6 (six) hours as needed for moderate pain. 20 tablet 0   prednisoLONE acetate (PRED FORTE) 1 % ophthalmic suspension Place 2 drops into both eyes every 4 (four) hours.     simvastatin (ZOCOR) 40 MG tablet Take 1 tablet (40 mg total) by mouth once daily (Patient taking differently: Take 20 mg by mouth daily.) 90 tablet 3   vitamin E 180 MG (400 UNITS) capsule Take 400 Units by mouth daily.     zolpidem (AMBIEN) 5 MG tablet TAKE 1 TABLET BY MOUTH NIGHTLY AS NEEDED FOR SLEEP 30 tablet 0   No current facility-administered  medications for this visit.    Allergies as of 12/31/2022 - Review Complete 10/26/2022  Allergen Reaction Noted   Morphine Nausea And Vomiting and Other (See Comments) 11/27/2022   Ciprofloxacin Swelling and Rash 12/01/2014   Elemental sulfur Itching, Swelling, and Rash 12/01/2014   Sulfa antibiotics Rash 11/19/2013   Sulfur Itching, Rash, and Swelling 12/01/2014    Review of Systems:    All systems reviewed and negative except where noted in HPI.  General Appearance:    Alert, cooperative, no distress, appears stated age  Head:    Normocephalic, without obvious abnormality, atraumatic  Eyes:    PERRL, conjunctiva/corneas clear,  Ears:    Grossly  normal hearing    Neurologic:  Grossly normal    Observations/Objective:  Labs: CMP     Component Value Date/Time   NA 139 10/29/2022 0618   NA 145 (H) 10/25/2022 1539   K 4.0 10/29/2022 0618   CL 109 10/29/2022 0618   CO2 25 10/29/2022 0618   GLUCOSE 96 10/29/2022 0618   BUN 16 10/29/2022 0618   BUN 18 10/25/2022 1539   CREATININE 0.80 10/29/2022 0618   CALCIUM 9.1 10/29/2022 0618   PROT 6.7 10/29/2022 0618   PROT 7.0 10/25/2022 1539   ALBUMIN 3.3 (L) 10/29/2022 0618   ALBUMIN 4.0 10/25/2022 1539   AST 21 10/29/2022 0618   ALT 26 10/29/2022 0618   ALKPHOS 91 10/29/2022 0618   BILITOT 0.5 10/29/2022 0618   BILITOT <0.2 10/25/2022 1539   GFRNONAA >60 10/29/2022 0618   Lab Results  Component Value Date   WBC 4.6 10/29/2022   HGB 12.0 10/29/2022   HCT 39.0 10/29/2022   MCV 77.7 (L) 10/29/2022   PLT 306 10/29/2022    Imaging Studies: CT ABDOMEN PELVIS W CONTRAST  Result Date: 12/06/2022 CLINICAL DATA:  Increasing left lower quadrant pain. Recent pelvic abscess is associated with diverticulitis. EXAM: CT ABDOMEN AND PELVIS WITH CONTRAST TECHNIQUE: Multidetector CT imaging of the abdomen and pelvis was performed using the standard protocol following bolus administration of intravenous contrast. RADIATION DOSE REDUCTION:  This exam was performed according to the departmental dose-optimization program which includes automated exposure control, adjustment of the mA and/or kV according to patient size and/or use of iterative reconstruction technique. CONTRAST:  OMNIPAQUE IOHEXOL 300 MG/ML  SOLN COMPARISON:  CT of the abdomen and pelvis with contrast 10/29/2022 and 10/26/2022. FINDINGS: Lower chest: The lung bases are clear without focal nodule, mass, or airspace disease. Heart size is normal. No significant pleural or pericardial effusion is present. Hepatobiliary: The small hypodensity in the posterior right lobe of the liver fills in on delayed imaging, compatible with a hemangioma. No other discrete hepatic lesions are present. The common bile duct and gallbladder are normal. Pancreas: Unremarkable. No pancreatic ductal dilatation or surrounding inflammatory changes. Spleen: Normal in size without focal abnormality. Adrenals/Urinary Tract: Adrenal glands are normal bilaterally. Kidneys are unremarkable. No stone or mass lesion is present. Ureters are within normal limits. The urinary bladder is normal. Stomach/Bowel: The stomach and duodenum are normal. Small bowel is unremarkable. Terminal ileum is normal. The ascending and transverse colon are within normal limits. Descending colon is unremarkable. Diverticular changes are present through the at the sigmoid colon. Similar diffuse inflammatory changes are present. The previously seen fluid collection inferior to the sigmoid colon has resolved. Vascular/Lymphatic: No significant vascular findings are present. No enlarged abdominal or pelvic lymph nodes. Reproductive: Status post hysterectomy. No adnexal masses. Other: No abdominal wall hernia or abnormality. No abdominopelvic ascites. Musculoskeletal: Mild degenerative changes in the lower lumbar spine are similar the prior study. No focal osseous lesions are present. IMPRESSION: 1. Sigmoid diverticulitis without evidence for  perforation or abscess. 2. The previously seen fluid collection inferior to the sigmoid colon has resolved. Electronically Signed   By: Marin Roberts M.D.   On: 12/06/2022 16:29    Assessment and Plan:   Patricia Oliver is a 72 y.o. y/o female here to see me for an episode of complicated diverticulitis with an abscess treated conservatively.  Followed by Dr. Tonna Boehringer plan is to perform surgery when the patient is ready for it she will contact him when she  makes up her mind in the interim we will perform a colonoscopy to rule out any neoplasm in the sigmoid colon.  Last episode of diverticulitis was in mid May we will plan to do the colonoscopy in July.    I have discussed alternative options, risks & benefits,  which include, but are not limited to, bleeding, infection, perforation,respiratory complication & drug reaction.  The patient agrees with this plan & written consent will be obtained.      I discussed the assessment and treatment plan with the patient. The patient was provided an opportunity to ask questions and all were answered. The patient agreed with the plan and demonstrated an understanding of the instructions.   The patient was advised to call back or seek an in-person evaluation if the symptoms worsen or if the condition fails to improve as anticipated.  I provided 15 minutes of face-to-face time during this encounter.  Dr Patricia Mood MD,MRCP [facility]) Gastroenterology/Hepatology Pager: [phone]   Speech recognition software was used to dictate this note.

## 2022-12-31 NOTE — Addendum Note (Signed)
Addended by: Adela Ports on: 12/31/2022 01:48 PM   Modules accepted: Orders

## 2022-12-31 NOTE — Addendum Note (Signed)
Addended by: Adela Ports on: 12/31/2022 01:51 PM   Modules accepted: Orders

## 2023-01-02 ENCOUNTER — Other Ambulatory Visit: Payer: Self-pay

## 2023-01-07 ENCOUNTER — Other Ambulatory Visit: Payer: Self-pay

## 2023-01-07 MED ORDER — KETOROLAC TROMETHAMINE 0.4 % OP SOLN
OPHTHALMIC | 2 refills | Status: DC
  Filled 2023-01-07: qty 5, 30d supply, fill #0
  Filled 2023-02-25: qty 5, 30d supply, fill #1

## 2023-01-08 ENCOUNTER — Other Ambulatory Visit: Payer: Self-pay

## 2023-01-09 ENCOUNTER — Other Ambulatory Visit: Payer: Self-pay

## 2023-01-16 ENCOUNTER — Encounter (INDEPENDENT_AMBULATORY_CARE_PROVIDER_SITE_OTHER): Payer: Medicare PPO | Admitting: Ophthalmology

## 2023-01-16 DIAGNOSIS — H2513 Age-related nuclear cataract, bilateral: Secondary | ICD-10-CM | POA: Diagnosis not present

## 2023-01-16 DIAGNOSIS — H43813 Vitreous degeneration, bilateral: Secondary | ICD-10-CM | POA: Diagnosis not present

## 2023-01-16 DIAGNOSIS — H33303 Unspecified retinal break, bilateral: Secondary | ICD-10-CM | POA: Diagnosis not present

## 2023-01-21 ENCOUNTER — Ambulatory Visit: Payer: Self-pay | Admitting: Urology

## 2023-01-21 HISTORY — PX: CATARACT EXTRACTION W/ INTRAOCULAR LENS IMPLANT: SHX1309

## 2023-01-29 ENCOUNTER — Other Ambulatory Visit: Payer: Self-pay

## 2023-01-29 MED ORDER — SIMVASTATIN 40 MG PO TABS
40.0000 mg | ORAL_TABLET | Freq: Every day | ORAL | 3 refills | Status: DC
  Filled 2023-01-29: qty 90, 90d supply, fill #0

## 2023-02-11 ENCOUNTER — Other Ambulatory Visit: Payer: Self-pay

## 2023-02-12 ENCOUNTER — Other Ambulatory Visit (HOSPITAL_COMMUNITY): Payer: Self-pay

## 2023-02-18 ENCOUNTER — Telehealth: Payer: Self-pay

## 2023-02-18 ENCOUNTER — Other Ambulatory Visit: Payer: Self-pay

## 2023-02-18 MED ORDER — DICYCLOMINE HCL 10 MG PO CAPS
10.0000 mg | ORAL_CAPSULE | Freq: Three times a day (TID) | ORAL | 1 refills | Status: DC
  Filled 2023-02-18: qty 360, 90d supply, fill #0

## 2023-02-18 NOTE — Telephone Encounter (Signed)
Called patient to let her know that her Sutab prescription is not approved by her insurance and that she is able to take Clenpiq. Patient stated that she would call her pharmacy how much it would cost her out of pocket and I told her that her pharmacy sent a message that it would cost her $216.53 cash. Patient stated that she would call her insurance tomorrow and ask them to approve it. I told her to call me after she speaks with them just in case I need to send the prescription that they cover (ClenPiq). Patient agreed.

## 2023-02-18 NOTE — Telephone Encounter (Signed)
Patient called back stating that her pharmacist told her for [place] to do a prior-auth and hope that [facility] covers it that way. So I told her that I would do it tomorrow morning.

## 2023-02-19 ENCOUNTER — Other Ambulatory Visit: Payer: Self-pay

## 2023-02-19 NOTE — Telephone Encounter (Signed)
Called patient but had to eave her a voicemail as she did not answer my call. I let her know that I was able to get her Su tab approved by his insurance so to please call her pharmacy to let them know. I asked to call all [place] if she had any additional issues.

## 2023-02-25 ENCOUNTER — Other Ambulatory Visit: Payer: Self-pay

## 2023-02-26 ENCOUNTER — Other Ambulatory Visit: Payer: Self-pay

## 2023-02-26 ENCOUNTER — Telehealth: Payer: Self-pay | Admitting: Gastroenterology

## 2023-02-26 DIAGNOSIS — U071 COVID-19: Secondary | ICD-10-CM

## 2023-02-26 DIAGNOSIS — Z8719 Personal history of other diseases of the digestive system: Secondary | ICD-10-CM

## 2023-02-26 HISTORY — DX: COVID-19: U07.1

## 2023-02-26 MED ORDER — BENZONATATE 100 MG PO CAPS
100.0000 mg | ORAL_CAPSULE | Freq: Three times a day (TID) | ORAL | 0 refills | Status: DC | PRN
  Filled 2023-02-26: qty 20, 7d supply, fill #0

## 2023-02-26 MED ORDER — PAXLOVID (300/100) 20 X 150 MG & 10 X 100MG PO TBPK
ORAL_TABLET | ORAL | 0 refills | Status: DC
  Filled 2023-02-26: qty 30, 5d supply, fill #0

## 2023-02-26 NOTE — Telephone Encounter (Signed)
Patient called in to reschedule her procedure patient has Covid-19.

## 2023-02-26 NOTE — Telephone Encounter (Signed)
Called patient earlier today and I was able to change her procedure to 04/10/2023. I then notified the endoscopy unit and they were able to change the date. Instructions were going to be provided again but patient stated that she still had the copy of her original date and all she was going to do was to change the dates.

## 2023-02-27 ENCOUNTER — Ambulatory Visit: Admission: RE | Admit: 2023-02-27 | Payer: Medicare PPO | Source: Home / Self Care | Admitting: Gastroenterology

## 2023-02-27 ENCOUNTER — Encounter: Admission: RE | Payer: Self-pay | Source: Home / Self Care

## 2023-02-27 SURGERY — COLONOSCOPY WITH PROPOFOL
Anesthesia: General

## 2023-03-01 ENCOUNTER — Other Ambulatory Visit: Payer: Self-pay

## 2023-03-11 ENCOUNTER — Other Ambulatory Visit: Payer: Self-pay

## 2023-03-11 MED ORDER — FLUTICASONE PROPIONATE 50 MCG/ACT NA SUSP
2.0000 | Freq: Every day | NASAL | 1 refills | Status: DC
  Filled 2023-03-11: qty 48, 90d supply, fill #0

## 2023-03-11 MED ORDER — AZELASTINE HCL 0.1 % NA SOLN
1.0000 | Freq: Every day | NASAL | 3 refills | Status: AC
  Filled 2023-03-11: qty 90, 90d supply, fill #0
  Filled 2023-09-24: qty 90, 90d supply, fill #1

## 2023-03-12 ENCOUNTER — Other Ambulatory Visit: Payer: Self-pay

## 2023-03-18 ENCOUNTER — Encounter
Admission: RE | Admit: 2023-03-18 | Discharge: 2023-03-18 | Disposition: A | Discharge status: Home / Self Care | Payer: Medicare PPO | Source: Ambulatory Visit | Attending: Surgery | Admitting: Surgery

## 2023-03-18 ENCOUNTER — Other Ambulatory Visit: Payer: Self-pay | Admitting: Surgery

## 2023-03-18 VITALS — Ht 63.0 in | Wt 140.4 lb

## 2023-03-18 DIAGNOSIS — Z01810 Encounter for preprocedural cardiovascular examination: Secondary | ICD-10-CM

## 2023-03-18 HISTORY — DX: Unspecified osteoarthritis, unspecified site: M19.90

## 2023-03-18 HISTORY — DX: Benign neoplasm of connective and other soft tissue of right upper limb, including shoulder: D21.11

## 2023-03-18 HISTORY — DX: Insomnia, unspecified: G47.00

## 2023-03-18 MED ORDER — LACTATED RINGERS IV SOLN: INTRAVENOUS | Status: DC

## 2023-03-18 MED ORDER — ORAL CARE MOUTH RINSE: 15.0000 mL | Freq: Once | OROMUCOSAL | Status: AC

## 2023-03-18 MED ORDER — CHLORHEXIDINE GLUCONATE 0.12 % MT SOLN
15.0000 mL | Freq: Once | OROMUCOSAL | Status: AC
  Administered 2023-03-19: 15 mL via OROMUCOSAL

## 2023-03-18 MED ORDER — CEFAZOLIN SODIUM-DEXTROSE 2-4 GM/100ML-% IV SOLN
2.0000 g | INTRAVENOUS | Status: AC
  Administered 2023-03-19: 2 g via INTRAVENOUS

## 2023-03-18 NOTE — Telephone Encounter (Signed)
Patient needs to reschedule her procedure 

## 2023-03-18 NOTE — Patient Instructions (Signed)
Your procedure is scheduled on:03-19-23 Tuesday Report to the Registration Desk on the 1st floor of the [facility].Then proceed to the 2nd floor Surgery Desk To find out your arrival time, please call [phone] between 1PM - 3PM on:03-18-23 Monday If your arrival time is 6:00 am, do not arrive before that time as the [facility] entrance doors do not open until 6:00 am.  REMEMBER: Instructions that are not followed completely may result in serious medical risk, up to and including death; or upon the discretion of your surgeon and anesthesiologist your surgery may need to be rescheduled.  Do not eat food after midnight the night before surgery.  No gum chewing or hard candies.  You may however, drink CLEAR liquids up to 2 hours before you are scheduled to arrive for your surgery. Do not drink anything within 2 hours of your scheduled arrival time.  Clear liquids include: - water  - apple juice without pulp - gatorade (not RED colors) - black coffee or tea (Do NOT add milk or creamers to the coffee or tea) Do NOT drink anything that is not on this list  In addition, your doctor has ordered for you to drink the provided:  Ensure Pre-Surgery Clear Carbohydrate Drink  Drinking this carbohydrate drink up to two hours before surgery helps to reduce insulin resistance and improve patient outcomes. Please complete drinking 2 hours before scheduled arrival time.  One week prior to surgery: Stop Anti-inflammatories (NSAIDS) such as Advil, Aleve, Ibuprofen, Motrin, Naproxen, Naprosyn and Aspirin based products such as Excedrin, Goody's Powder, BC Powder.You may however, take Tylenol if needed for pain up until the day of surgery. Stop ANY OVER THE COUNTER supplements/vitamins NOW (03-18-23) until after surgery.   Continue taking all prescribed medications   TAKE ONLY THESE MEDICATIONS THE MORNING OF SURGERY WITH A SIP OF WATER: -famotidine (PEPCID)-take one the night before and one on the  morning of surgery - helps to prevent nausea after surgery.)  No Alcohol for 24 hours before or after surgery.  No Smoking including e-cigarettes for 24 hours before surgery.  No chewable tobacco products for at least 6 hours before surgery.  No nicotine patches on the day of surgery.  Do not use any "recreational" drugs for at least a week (preferably 2 weeks) before your surgery.  Please be advised that the combination of cocaine and anesthesia may have negative outcomes, up to and including death. If you test positive for cocaine, your surgery will be cancelled.  On the morning of surgery brush your teeth with toothpaste and water, you may rinse your mouth with mouthwash if you wish. Do not swallow any toothpaste or mouthwash.  Use CHG Soap as directed on instruction sheet.  Do not wear jewelry, make-up, hairpins, clips or nail polish.  Do not wear lotions, powders, or perfumes.   Do not shave body hair from the neck down 48 hours before surgery.  Contact lenses, hearing aids and dentures may not be worn into surgery.  Do not bring valuables to the hospital. [facility] is not responsible for any missing/lost belongings or valuables.   Notify your doctor if there is any change in your medical condition (cold, fever, infection).  Wear comfortable clothing (specific to your surgery type) to the hospital.  After surgery, you can help prevent lung complications by doing breathing exercises.  Take deep breaths and cough every 1-2 hours. Your doctor may order a device called an Incentive Spirometer to help you take deep  breaths. When coughing or sneezing, hold a pillow firmly against your incision with both hands. This is called "splinting." Doing this helps protect your incision. It also decreases belly discomfort.  If you are being admitted to the hospital overnight, leave your suitcase in the car. After surgery it may be brought to your room.  In case of increased patient census,  it may be necessary for you, the patient, to continue your postoperative care in the Same Day Surgery department.  If you are being discharged the day of surgery, you will not be allowed to drive home. You will need a responsible individual to drive you home and stay with you for 24 hours after surgery.   If you are taking public transportation, you will need to have a responsible individual with you.  Please call the Pre-admissions Testing Dept. at [phone] if you have any questions about these instructions.  Surgery Visitation Policy:  Patients having surgery or a procedure may have two visitors.  Children under the age of 19 must have an adult with them who is not the patient.     Preparing for Surgery with CHLORHEXIDINE GLUCONATE (CHG) Soap  Chlorhexidine Gluconate (CHG) Soap  o An antiseptic cleaner that kills germs and bonds with the skin to continue killing germs even after washing  o Used for showering the night before surgery and morning of surgery  Before surgery, you can play an important role by reducing the number of germs on your skin.  CHG (Chlorhexidine gluconate) soap is an antiseptic cleanser which kills germs and bonds with the skin to continue killing germs even after washing.  Please do not use if you have an allergy to CHG or antibacterial soaps. If your skin becomes reddened/irritated stop using the CHG.  1. Shower the NIGHT BEFORE SURGERY and the MORNING OF SURGERY with CHG soap.  2. If you choose to wash your hair, wash your hair first as usual with your normal shampoo.  3. After shampooing, rinse your hair and body thoroughly to remove the shampoo.  4. Use CHG as you would any other liquid soap. You can apply CHG directly to the skin and wash gently with a scrungie or a clean washcloth.  5. Apply the CHG soap to your body only from the neck down. Do not use on open wounds or open sores. Avoid contact with your eyes, ears, mouth, and genitals  (private parts). Wash face and genitals (private parts) with your normal soap.  6. Wash thoroughly, paying special attention to the area where your surgery will be performed.  7. Thoroughly rinse your body with warm water.  8. Do not shower/wash with your normal soap after using and rinsing off the CHG soap.  9. Pat yourself dry with a clean towel.  10. Wear clean pajamas to bed the night before surgery.  12. Place clean sheets on your bed the night of your first shower and do not sleep with pets.  13. Shower again with the CHG soap on the day of surgery prior to arriving at the hospital.  14. Do not apply any deodorants/lotions/powders.  15. Please wear clean clothes to the hospital.  How to Use an Incentive Spirometer An incentive spirometer is a tool that measures how well you are filling your lungs with each breath. Learning to take long, deep breaths using this tool can help you keep your lungs clear and active. This may help to reverse or lessen your chance of developing breathing (pulmonary) problems,  especially infection. You may be asked to use a spirometer: After a surgery. If you have a lung problem or a history of smoking. After a long period of time when you have been unable to move or be active. If the spirometer includes an indicator to show the highest number that you have reached, your health care provider or respiratory therapist will help you set a goal. Keep a log of your progress as told by your health care provider. What are the risks? Breathing too quickly may cause dizziness or cause you to pass out. Take your time so you do not get dizzy or light-headed. If you are in pain, you may need to take pain medicine before doing incentive spirometry. It is harder to take a deep breath if you are having pain. How to use your incentive spirometer  Sit up on the edge of your bed or on a chair. Hold the incentive spirometer so that it is in an upright position. Before  you use the spirometer, breathe out normally. Place the mouthpiece in your mouth. Make sure your lips are closed tightly around it. Breathe in slowly and as deeply as you can through your mouth, causing the piston or the ball to rise toward the top of the chamber. Hold your breath for 3-5 seconds, or for as long as possible. If the spirometer includes a coach indicator, use this to guide you in breathing. Slow down your breathing if the indicator goes above the marked areas. Remove the mouthpiece from your mouth and breathe out normally. The piston or ball will return to the bottom of the chamber. Rest for a few seconds, then repeat the steps 10 or more times. Take your time and take a few normal breaths between deep breaths so that you do not get dizzy or light-headed. Do this every 1-2 hours when you are awake. If the spirometer includes a goal marker to show the highest number you have reached (best effort), use this as a goal to work toward during each repetition. After each set of 10 deep breaths, cough a few times. This will help to make sure that your lungs are clear. If you have an incision on your chest or abdomen from surgery, place a pillow or a rolled-up towel firmly against the incision when you cough. This can help to reduce pain while taking deep breaths and coughing. General tips When you are able to get out of bed: Walk around often. Continue to take deep breaths and cough in order to clear your lungs. Keep using the incentive spirometer until your health care provider says it is okay to stop using it. If you have been in the hospital, you may be told to keep using the spirometer at home. Contact a health care provider if: You are having difficulty using the spirometer. You have trouble using the spirometer as often as instructed. Your pain medicine is not giving enough relief for you to use the spirometer as told. You have a fever. Get help right away if: You develop  shortness of breath. You develop a cough with bloody mucus from the lungs. You have fluid or blood coming from an incision site after you cough. Summary An incentive spirometer is a tool that can help you learn to take long, deep breaths to keep your lungs clear and active. You may be asked to use a spirometer after a surgery, if you have a lung problem or a history of smoking, or if you have been  inactive for a long period of time. Use your incentive spirometer as instructed every 1-2 hours while you are awake. If you have an incision on your chest or abdomen, place a pillow or a rolled-up towel firmly against your incision when you cough. This will help to reduce pain. Get help right away if you have shortness of breath, you cough up bloody mucus, or blood comes from your incision when you cough. This information is not intended to replace advice given to you by your health care provider. Make sure you discuss any questions you have with your health care provider. Document Revised: 09/28/2019 Document Reviewed: 09/28/2019 Elsevier Patient Education  2024 [employer].

## 2023-03-19 ENCOUNTER — Other Ambulatory Visit: Payer: Self-pay

## 2023-03-19 ENCOUNTER — Encounter
Admission: RE | Disposition: A | Discharge status: Home / Self Care | Payer: Self-pay | Source: Home / Self Care | Attending: Surgery

## 2023-03-19 ENCOUNTER — Encounter: Payer: Self-pay | Admitting: Surgery

## 2023-03-19 ENCOUNTER — Ambulatory Visit: Payer: Medicare PPO | Admitting: Urgent Care

## 2023-03-19 ENCOUNTER — Ambulatory Visit
Admission: RE | Admit: 2023-03-19 | Discharge: 2023-03-19 | Disposition: A | Discharge status: Home / Self Care | Payer: Medicare PPO | Source: Home / Self Care | Attending: Surgery | Admitting: Surgery

## 2023-03-19 ENCOUNTER — Ambulatory Visit: Payer: Medicare PPO | Admitting: Certified Registered Nurse Anesthetist

## 2023-03-19 DIAGNOSIS — Z803 Family history of malignant neoplasm of breast: Secondary | ICD-10-CM | POA: Diagnosis not present

## 2023-03-19 DIAGNOSIS — D2111 Benign neoplasm of connective and other soft tissue of right upper limb, including shoulder: Secondary | ICD-10-CM | POA: Insufficient documentation

## 2023-03-19 DIAGNOSIS — Z01810 Encounter for preprocedural cardiovascular examination: Secondary | ICD-10-CM

## 2023-03-19 DIAGNOSIS — Z807 Family history of other malignant neoplasms of lymphoid, hematopoietic and related tissues: Secondary | ICD-10-CM | POA: Diagnosis not present

## 2023-03-19 HISTORY — PX: MASS EXCISION: SHX2000

## 2023-03-19 SURGERY — EXCISION MASS
Anesthesia: General | Site: Index Finger | Laterality: Right

## 2023-03-19 MED ORDER — LIDOCAINE HCL (PF) 2 % IJ SOLN
INTRAMUSCULAR | Status: AC
  Filled 2023-03-19: qty 5

## 2023-03-19 MED ORDER — PHENYLEPHRINE 80 MCG/ML (10ML) SYRINGE FOR IV PUSH (FOR BLOOD PRESSURE SUPPORT)
PREFILLED_SYRINGE | INTRAVENOUS | Status: AC
  Filled 2023-03-19: qty 10

## 2023-03-19 MED ORDER — OXYCODONE HCL 5 MG/5ML PO SOLN: 5.0000 mg | Freq: Once | ORAL | Status: DC | PRN

## 2023-03-19 MED ORDER — BUPIVACAINE HCL (PF) 0.5 % IJ SOLN
INTRAMUSCULAR | Status: AC
  Filled 2023-03-19: qty 30

## 2023-03-19 MED ORDER — 0.9 % SODIUM CHLORIDE (POUR BTL) OPTIME
TOPICAL | Status: DC | PRN
  Administered 2023-03-19: 100 mL

## 2023-03-19 MED ORDER — PHENYLEPHRINE 80 MCG/ML (10ML) SYRINGE FOR IV PUSH (FOR BLOOD PRESSURE SUPPORT)
PREFILLED_SYRINGE | INTRAVENOUS | Status: DC | PRN
  Administered 2023-03-19: 80 ug via INTRAVENOUS
  Administered 2023-03-19: 160 ug via INTRAVENOUS

## 2023-03-19 MED ORDER — LIDOCAINE HCL (CARDIAC) PF 100 MG/5ML IV SOSY
PREFILLED_SYRINGE | INTRAVENOUS | Status: DC | PRN
  Administered 2023-03-19: 60 mg via INTRAVENOUS

## 2023-03-19 MED ORDER — FENTANYL CITRATE (PF) 100 MCG/2ML IJ SOLN
INTRAMUSCULAR | Status: DC | PRN
  Administered 2023-03-19: 50 ug via INTRAVENOUS

## 2023-03-19 MED ORDER — BUPIVACAINE HCL (PF) 0.5 % IJ SOLN
INTRAMUSCULAR | Status: DC | PRN
  Administered 2023-03-19: 5 mL

## 2023-03-19 MED ORDER — ONDANSETRON HCL 4 MG/2ML IJ SOLN: 4.0000 mg | Freq: Four times a day (QID) | INTRAMUSCULAR | Status: DC | PRN

## 2023-03-19 MED ORDER — ONDANSETRON HCL 4 MG/2ML IJ SOLN
INTRAMUSCULAR | Status: DC | PRN
  Administered 2023-03-19: 4 mg via INTRAVENOUS

## 2023-03-19 MED ORDER — KETOROLAC TROMETHAMINE 30 MG/ML IJ SOLN
INTRAMUSCULAR | Status: AC
  Filled 2023-03-19: qty 1

## 2023-03-19 MED ORDER — METOCLOPRAMIDE HCL 10 MG PO TABS: 5.0000 mg | ORAL_TABLET | Freq: Three times a day (TID) | ORAL | Status: DC | PRN

## 2023-03-19 MED ORDER — METOCLOPRAMIDE HCL 5 MG/ML IJ SOLN: 5.0000 mg | Freq: Three times a day (TID) | INTRAMUSCULAR | Status: DC | PRN

## 2023-03-19 MED ORDER — ACETAMINOPHEN 10 MG/ML IV SOLN: 1000.0000 mg | Freq: Once | INTRAVENOUS | Status: DC | PRN

## 2023-03-19 MED ORDER — MONTELUKAST SODIUM 10 MG PO TABS: 10.0000 mg | ORAL_TABLET | Freq: Every day | ORAL | Status: DC

## 2023-03-19 MED ORDER — CHLORHEXIDINE GLUCONATE 0.12 % MT SOLN
OROMUCOSAL | Status: AC
  Filled 2023-03-19: qty 15

## 2023-03-19 MED ORDER — ONDANSETRON HCL 4 MG PO TABS: 4.0000 mg | ORAL_TABLET | Freq: Four times a day (QID) | ORAL | Status: DC | PRN

## 2023-03-19 MED ORDER — ACETAMINOPHEN 325 MG PO TABS: 325.0000 mg | ORAL_TABLET | Freq: Four times a day (QID) | ORAL | Status: DC | PRN

## 2023-03-19 MED ORDER — PROPOFOL 10 MG/ML IV BOLUS
INTRAVENOUS | Status: AC
  Filled 2023-03-19: qty 20

## 2023-03-19 MED ORDER — CEFAZOLIN SODIUM-DEXTROSE 2-4 GM/100ML-% IV SOLN
INTRAVENOUS | Status: AC
  Filled 2023-03-19: qty 100

## 2023-03-19 MED ORDER — FLUTICASONE PROPIONATE 50 MCG/ACT NA SUSP: 2.0000 | NASAL | Status: AC

## 2023-03-19 MED ORDER — DEXAMETHASONE SODIUM PHOSPHATE 10 MG/ML IJ SOLN
INTRAMUSCULAR | Status: DC | PRN
  Administered 2023-03-19: 10 mg via INTRAVENOUS

## 2023-03-19 MED ORDER — DICYCLOMINE HCL 10 MG PO CAPS: 10.0000 mg | ORAL_CAPSULE | Freq: Two times a day (BID) | ORAL | Status: AC

## 2023-03-19 MED ORDER — FENTANYL CITRATE (PF) 100 MCG/2ML IJ SOLN
INTRAMUSCULAR | Status: AC
  Filled 2023-03-19: qty 2

## 2023-03-19 MED ORDER — KETOROLAC TROMETHAMINE 30 MG/ML IJ SOLN
INTRAMUSCULAR | Status: DC | PRN
  Administered 2023-03-19: 15 mg via INTRAVENOUS

## 2023-03-19 MED ORDER — DROPERIDOL 2.5 MG/ML IJ SOLN: 0.6250 mg | Freq: Once | INTRAMUSCULAR | Status: DC | PRN

## 2023-03-19 MED ORDER — PROPOFOL 10 MG/ML IV BOLUS
INTRAVENOUS | Status: DC | PRN
  Administered 2023-03-19: 140 mg via INTRAVENOUS

## 2023-03-19 MED ORDER — FENTANYL CITRATE (PF) 100 MCG/2ML IJ SOLN: 25.0000 ug | INTRAMUSCULAR | Status: DC | PRN

## 2023-03-19 MED ORDER — FAMOTIDINE 40 MG PO TABS: 40.0000 mg | ORAL_TABLET | ORAL | Status: AC

## 2023-03-19 MED ORDER — ESTRADIOL 0.1 MG/GM VA CREA: 1.0000 | TOPICAL_CREAM | VAGINAL | Status: AC

## 2023-03-19 MED ORDER — SODIUM CHLORIDE 0.9 % IV SOLN: INTRAVENOUS | Status: DC

## 2023-03-19 MED ORDER — SEVOFLURANE IN SOLN
RESPIRATORY_TRACT | Status: AC
  Filled 2023-03-19: qty 250

## 2023-03-19 MED ORDER — PROMETHAZINE HCL 25 MG/ML IJ SOLN: 6.2500 mg | INTRAMUSCULAR | Status: DC | PRN

## 2023-03-19 MED ORDER — OXYCODONE HCL 5 MG PO TABS: 5.0000 mg | ORAL_TABLET | Freq: Once | ORAL | Status: DC | PRN

## 2023-03-19 MED ORDER — ONDANSETRON HCL 4 MG/2ML IJ SOLN
INTRAMUSCULAR | Status: AC
  Filled 2023-03-19: qty 2

## 2023-03-19 MED ORDER — KETOROLAC TROMETHAMINE 15 MG/ML IJ SOLN: 15.0000 mg | Freq: Once | INTRAMUSCULAR | Status: DC

## 2023-03-19 SURGICAL SUPPLY — 23 items
APL PRP STRL LF DISP 70% ISPRP (MISCELLANEOUS) ×1
BNDG ESMARCH 4 X 12 STRL LF (GAUZE/BANDAGES/DRESSINGS) ×1
BNDG ESMARCH 4X12 STRL LF (GAUZE/BANDAGES/DRESSINGS) ×1 IMPLANT
CHLORAPREP W/TINT 26 (MISCELLANEOUS) ×1 IMPLANT
CORD BIP STRL DISP 12FT (MISCELLANEOUS) ×1 IMPLANT
CUFF TOURN SGL QUICK 18X4 (TOURNIQUET CUFF) IMPLANT
GAUZE SPONGE 4X4 12PLY STRL (GAUZE/BANDAGES/DRESSINGS) ×1 IMPLANT
GAUZE XEROFORM 1X8 LF (GAUZE/BANDAGES/DRESSINGS) ×1 IMPLANT
GLOVE BIO SURGEON STRL SZ8 (GLOVE) ×2 IMPLANT
GLOVE INDICATOR 8.0 STRL GRN (GLOVE) ×1 IMPLANT
GOWN STRL REUS W/ TWL LRG LVL3 (GOWN DISPOSABLE) ×1 IMPLANT
GOWN STRL REUS W/ TWL XL LVL3 (GOWN DISPOSABLE) ×1 IMPLANT
GOWN STRL REUS W/TWL LRG LVL3 (GOWN DISPOSABLE) ×1
GOWN STRL REUS W/TWL XL LVL3 (GOWN DISPOSABLE) ×1
KIT TURNOVER KIT A (KITS) ×1 IMPLANT
MANIFOLD NEPTUNE II (INSTRUMENTS) ×1 IMPLANT
NS IRRIG 500ML POUR BTL (IV SOLUTION) ×1 IMPLANT
PACK EXTREMITY ARMC (MISCELLANEOUS) ×1 IMPLANT
STOCKINETTE IMPERVIOUS 9X36 MD (GAUZE/BANDAGES/DRESSINGS) ×1 IMPLANT
SUT PROLENE 4 0 PS 2 18 (SUTURE) ×1 IMPLANT
SUT VIC AB 3-0 SH 27 (SUTURE)
SUT VIC AB 3-0 SH 27X BRD (SUTURE) IMPLANT
TRAP FLUID SMOKE EVACUATOR (MISCELLANEOUS) ×1 IMPLANT

## 2023-03-19 NOTE — H&P (Signed)
History of Present Illness: Patricia Oliver is a 72 y.o.female who returns for follow-up of her right index finger. 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 The patient notes that she has done well following this procedure. However, over the past several months, she has noticed a recurrent soft tissue mass growing in this area. She notes some discomfort in her index finger with range of motion, but denies any recurrent injury to the finger.  Current Outpatient Medications:  ascorbic acid, vitamin C, (VITAMIN C) 500 MG tablet Take 500 mg by mouth once daily.  azelastine (ASTELIN) 137 mcg nasal spray PLACE 1 SPRAY INTO BOTH NOSTRILS ONCE DAILY 90 mL 3  azelastine (OPTIVAR) 0.05 % ophthalmic solution Place 1 drop into both eyes once daily 6 mL 5  benzonatate (TESSALON) 100 MG capsule Take 1 capsule (100 mg total) by mouth 3 (three) times daily as needed for Cough for up to 7 days 20 capsule 0  calcium citrate-vitamin D3 (CITRACAL+D) 315 mg-6.25 mcg (250 unit) tablet Take 1 tablet by mouth 2 (two) times daily with meals.  celecoxib (CELEBREX) 200 MG capsule Take 1 capsule (200 mg total) by mouth 2 (two) times daily 180 capsule 1  cyanocobalamin (VITAMIN B12) 1000 MCG tablet Take 1,000 mcg by mouth once daily  dicyclomine (BENTYL) 10 mg capsule Take 1 capsule (10 mg total) by mouth 4 (four) times daily before meals and nightly 360 capsule 1  estradioL (ESTRACE) 0.01 % (0.1 mg/gram) vaginal cream Place vaginally  famotidine (PEPCID) 40 MG tablet Take 1 tablet (40 mg total) by mouth 2 (two) times daily as needed for Heartburn 180 tablet 1  fluticasone propionate (FLONASE) 50 mcg/actuation nasal spray Place 2 sprays into both nostrils once daily 48 g 1  glucosamine su 2KCl-chondroit 500-400 mg Tab Take 1 tablet by mouth once daily  guaiFENesin (MUCINEX) 600 mg SR tablet Take 600 mg by  mouth every 12 (twelve) hours as needed for Cough.  inulin (FIBER GUMMIES) 2 gram Chew Take by mouth once daily  montelukast (SINGULAIR) 10 mg tablet Take 1 tablet (10 mg total) by mouth once daily 90 tablet 3  ondansetron (ZOFRAN) 4 MG tablet  simvastatin (ZOCOR) 40 MG tablet Take 1 tablet (40 mg total) by mouth once daily 90 tablet 3  tiZANidine (ZANAFLEX) 2 MG tablet Take 1 tablet (2 mg total) by mouth nightly as needed 30 tablet 1  vitamin E 400 UNIT capsule Take 400 Units by mouth once daily.   Allergies:  Ciprofloxacin Swelling  Morphine Vomiting, Nausea And Vomiting and Other (See Comments)  Sulfa (Sulfonamide Antibiotics) Rash, Itching and Swelling   Past Medical History:  Acid reflux disease  Arthritis  Cataract cortical, senile (left eye)  Chicken pox  Diverticulosis 12/10/2014  Fibrocystic breast disease with chronic right breast nodule.  History of palpitations  Hyperlipidemia  Hyperplastic colon polyp 12/10/2014  Internal hemorrhoids 12/10/2014  Osteopenia  Perennial allergic rhinitis  Stress fracture of right foot  Vaginal dryness   Past Surgical History:  APPENDECTOMY 07/23/1954  HYSTERECTOMY 07/24/1999  TAH/BSO  FRACTURE SURGERY Right 07/23/2004  ORIF right foot fracture.  COLONOSCOPY 09/01/2004 (repeat 10 years)  COLONOSCOPY 12/10/2014 (Hyperplastic colon polyp/Repeat 54yrs/MUS)  Excision of soft tissue mass with repair of extensor tendon, right index PIP joint region Right 08/09/2021 (Dr. Joice Lofts)   Family History:  Alzheimer's disease Mother  Multiple myeloma Father  Breast cancer Sister  Kidney disease Brother  Breast cancer Sister   Social History:   Socioeconomic History:  Marital status: Married  Tobacco Use  Smoking status: Never  Passive exposure: Never  Smokeless tobacco: Never  Vaping Use  Vaping status: Never Used  Substance and Sexual Activity  Alcohol use: No  Alcohol/week: 0.0 standard drinks of alcohol  Drug use: No  Sexual  activity: Yes  Partners: Male  Birth control/protection: Surgical   Social Determinants of Health:   Financial Resource Strain: Low Risk (01/29/2023)  Overall Financial Resource Strain (CARDIA)  Difficulty of Paying Living Expenses: Not hard at all  Food Insecurity: No Food Insecurity (01/29/2023)  Hunger Vital Sign  Worried About Running Out of Food in the Last Year: Never true  Ran Out of Food in the Last Year: Never true  Transportation Needs: No Transportation Needs (01/29/2023)  PRAPARE - Risk analyst (Medical): No  Lack of Transportation (Non-Medical): No   Review of Systems:  A comprehensive 14 point ROS was performed, reviewed, and the pertinent orthopaedic findings are documented in the HPI.  Physical Exam: Vitals:  03/04/23 1421  BP: (!) 132/96  Weight: 63.7 kg (140 lb 6.4 oz)  Height: 160 cm (5\' 3" )  PainSc: 3  PainLoc: Finger   General/Constitutional: The patient appears to be well-nourished, well-developed, and in no acute distress. Neuro/Psych: Normal mood and affect, oriented to person, place and time. Eyes: Non-icteric. Pupils are equal, round, and reactive to light, and exhibit synchronous movement. Lymphatic: No palpable adenopathy. Respiratory: No wheezes and Non-labored breathing Cardiovascular: No edema, swelling or tenderness, except as noted in detailed exam. Vascular: No edema, swelling or tenderness, except as noted in detailed exam. Integumentary: No impressive skin lesions present, except as noted in detailed exam. Musculoskeletal: Unremarkable, except as noted in detailed exam.  Right hand exam: On inspection, her surgical incision on the ulnar aspect of her index finger is well-healed and without evidence for infection. No swelling, erythema, ecchymosis, abrasions, or other skin abnormalities identified. There is a small recurrent soft tissue mass along the ulnar aspect of the PIP joint measuring approximately 2 x 3 mm which is  at most minimally tender to palpation. There are no other areas of tenderness around the hand or finger. She exhibits full active passive range of motion of the finger without any pain or catching. She is neurovascularly intact to all digits.  Assessment:  1. 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 In addition, patient educational materials were provided regarding the diagnosis and treatment options. Regarding the left finger, the patient is quite concerned by the recurrence of the mass and would like to have it removed. Therefore, I have recommended a surgical procedure, specifically an excision of the recurrent soft tissue mass along the ulnar aspect of the right index finger. The procedure was discussed with the patient, as were the potential risks (including bleeding, infection, nerve and/or blood vessel injury, persistent or recurrent pain, recurrence of the mass, stiffness of the finger, need for further surgery, blood clots, strokes, heart attacks and/or arhythmias, pneumonia, etc.) and benefits. The patient states her understanding and wishes to proceed. All of the patient's questions and concerns were answered. She can call any time with further concerns. She will return to work without restrictions. She will follow up post-surgery, routine.    H&P reviewed and patient re-examined. No changes.

## 2023-03-19 NOTE — Transfer of Care (Signed)
Immediate Anesthesia Transfer of Care Note  Patient: Patricia Oliver  Procedure(s) Performed: EXCISION OF RECURRENT SOFT TISSUE MASS OF RIGHT INDEX FINGER (Right: Index Finger)  Patient Location: PACU  Anesthesia Type:General  Level of Consciousness: drowsy  Airway & Oxygen Therapy: Patient Spontanous Breathing and Patient connected to face mask oxygen  Post-op Assessment: Report given to RN and Post -op Vital signs reviewed and stable  Post vital signs: Reviewed and stable  Last Vitals:  Vitals Value Taken Time  BP    Temp    Pulse 58 03/19/23 1036  Resp 11 03/19/23 1036  SpO2 100 % 03/19/23 1036  Vitals shown include unfiled device data.  Last Pain:  Vitals:   03/19/23 0826  TempSrc: Oral  PainSc: 0-No pain         Complications: No notable events documented.

## 2023-03-19 NOTE — Anesthesia Postprocedure Evaluation (Signed)
Anesthesia Post Note  Patient: Patricia Oliver  Procedure(s) Performed: EXCISION OF RECURRENT SOFT TISSUE MASS OF RIGHT INDEX FINGER (Right: Index Finger)  Patient location during evaluation: PACU Anesthesia Type: General Level of consciousness: awake and alert Pain management: pain level controlled Vital Signs Assessment: post-procedure vital signs reviewed and stable Respiratory status: spontaneous breathing, nonlabored ventilation, respiratory function stable and patient connected to nasal cannula oxygen Cardiovascular status: blood pressure returned to baseline and stable Postop Assessment: no apparent nausea or vomiting Anesthetic complications: no   No notable events documented.   Last Vitals:  Vitals:   03/19/23 1045 03/19/23 1051  BP: (!) 145/88 (!) 148/91  Pulse: 69 71  Resp: 17 18  Temp:  (!) 36.1 C  SpO2: 99% 99%    Last Pain:  Vitals:   03/19/23 1051  TempSrc:   PainSc: 0-No pain                 Yevette Edwards

## 2023-03-19 NOTE — Anesthesia Preprocedure Evaluation (Signed)
Anesthesia Evaluation  Patient identified by MRN, date of birth, ID band Patient awake    Reviewed: Allergy & Precautions, H&P , NPO status , Patient's Chart, lab work & pertinent test results, reviewed documented beta blocker date and time   Airway Mallampati: II  TM Distance: >3 FB Neck ROM: full    Dental  (+) Teeth Intact   Pulmonary neg pulmonary ROS   Pulmonary exam normal        Cardiovascular Exercise Tolerance: Good negative cardio ROS Normal cardiovascular exam Rate:Normal     Neuro/Psych negative neurological ROS  negative psych ROS   GI/Hepatic Neg liver ROS,GERD  Medicated,,  Endo/Other  negative endocrine ROS    Renal/GU negative Renal ROS  negative genitourinary   Musculoskeletal   Abdominal   Peds  Hematology negative hematology ROS (+)   Anesthesia Other Findings   Reproductive/Obstetrics negative OB ROS                             Anesthesia Physical Anesthesia Plan  ASA: 2  Anesthesia Plan: General LMA   Post-op Pain Management:    Induction:   PONV Risk Score and Plan:   Airway Management Planned:   Additional Equipment:   Intra-op Plan:   Post-operative Plan:   Informed Consent: I have reviewed the patients History and Physical, chart, labs and discussed the procedure including the risks, benefits and alternatives for the proposed anesthesia with the patient or authorized representative who has indicated his/her understanding and acceptance.       Plan Discussed with: CRNA  Anesthesia Plan Comments:        Anesthesia Quick Evaluation

## 2023-03-19 NOTE — Op Note (Signed)
03/19/2023  10:31 AM  Patient:   Patricia Oliver  Pre-Op Diagnosis:   Recurrent soft tissue mass ulnar aspect of right index finger.  Post-Op Diagnosis:   Recurrent soft tissue mass ulnar right index finger.  Procedure:   Excision of recurrent soft tissue mass ulnar aspect right index finger.  Surgeon:   Maryagnes Amos, MD  Assistant:   None  Anesthesia:   General LMA  Findings:   As above.  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  Complications:   None  Fluids:   300 cc crystalloid  EBL:   0 cc  UOP:   None  TT:   12 minutes at 250 mmHg  Drains:   None  Closure:   4-0 Prolene interrupted sutures  Brief Clinical Note:   The patient is a 72 year old female who is now 1.5 years status post an excision of a benign neoplasm along the ulnar aspect of the right index at the level of the PIP joint.  The mass was consistent with a giant cell tumor of tendon sheath.  The patient did well until noticing a recurrence of the mass several months ago.  She presents at this time for excision of the recurrent soft tissue mass.  Procedure:   The patient was brought into the operating room and lain in the supine position.  After adequate general laryngeal mask anesthesia was obtained, the patient's right hand and upper extremity were prepped with ChloraPrep solution before being draped sterilely.  Preoperative antibiotics were administered.  A timeout was performed to verify the appropriate surgical site before the limb was exsanguinated with an Esmarch and the tourniquet inflated to 250 mmHg.  Utilizing the previous incision, an approximately 2 cm incision was made longitudinally over the ulnar aspect of the index finger at the level of the PIP joint.  The incision was carried down through the subcutaneous tissues.  The mass was readily identified and sharply excised circumferentially to remove it in its entirety.  The extensor tendon and collateral ligaments  appeared to be intact and unaffected by the recurrence of the mass.  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  The wound was copiously irrigated with sterile saline solution using bulb irrigation before the wound was closed using 4-0 Prolene interrupted sutures.  A total of 5 cc of 0.5% plain Sensorcaine was injected into and around the incision to help with postoperative analgesia before a sterile bulky dressing was applied to the finger.  The patient was then awakened, extubated, and returned to the recovery room in satisfactory condition after tolerating the procedure well.

## 2023-03-19 NOTE — Discharge Instructions (Addendum)
Orthopedic discharge instructions: Keep dressing dry and intact. Keep hand elevated above heart level. May shower after dressing removed on postop day 4 (Saturday). Cover sutures with Band-Aids after drying off. Apply ice to affected area frequently. Resume Celebrex 200 mg daily OR take ibuprofen 600 mg TID with meals for 3-5 days, then as necessary. Take ES Tylenol as prescribed when needed.  Return for follow-up in 10-14 days or as scheduled.  AMBULATORY SURGERY  DISCHARGE INSTRUCTIONS   The drugs that you were given will stay in your system until tomorrow so for the next 24 hours you should not:  Drive an automobile Make any legal decisions Drink any alcoholic beverage   You may resume regular meals tomorrow.  Today it is better to start with liquids and gradually work up to solid foods.  You may eat anything you prefer, but it is better to start with liquids, then soup and crackers, and gradually work up to solid foods.   Please notify your doctor immediately if you have any unusual bleeding, trouble breathing, redness and pain at the surgery site, drainage, fever, or pain not relieved by medication.    Additional Instructions:    Please contact your physician with any problems or Same Day Surgery at [phone], Monday through Friday 6 am to 4 pm, or [place] at [facility] number at [phone].

## 2023-03-19 NOTE — Anesthesia Procedure Notes (Signed)
Procedure Name: LMA Insertion Date/Time: 03/19/2023 9:57 AM  Performed by: Hezzie Bump, CRNAPre-anesthesia Checklist: Patient identified, Patient being monitored, Timeout performed, Emergency Drugs available and Suction available Patient Re-evaluated:Patient Re-evaluated prior to induction Oxygen Delivery Method: Circle system utilized Preoxygenation: Pre-oxygenation with 100% oxygen Induction Type: IV induction Ventilation: Mask ventilation without difficulty LMA: LMA inserted and LMA with gastric port inserted LMA Size: 3.0 Tube type: Oral Number of attempts: 1 Placement Confirmation: positive ETCO2 and breath sounds checked- equal and bilateral Tube secured with: Tape Dental Injury: Teeth and Oropharynx as per pre-operative assessment

## 2023-03-20 NOTE — Telephone Encounter (Signed)
Called patient back and she stated that she has two surgeries coming up soon so she wanted to reschedule her procedure to a later date. She agreed on having her procedure until 07/08/2023. I then called the endo unit and spoke to Trish to let her know that the patient would like to have her procedure switched to 07/08/2023.

## 2023-03-24 HISTORY — PX: CATARACT EXTRACTION W/ INTRAOCULAR LENS IMPLANT: SHX1309

## 2023-03-26 ENCOUNTER — Other Ambulatory Visit: Payer: Self-pay

## 2023-03-26 MED ORDER — MONTELUKAST SODIUM 10 MG PO TABS
10.0000 mg | ORAL_TABLET | Freq: Every day | ORAL | 3 refills | Status: DC
  Filled 2023-03-26: qty 90, 90d supply, fill #0

## 2023-04-04 ENCOUNTER — Other Ambulatory Visit: Payer: Self-pay

## 2023-04-15 ENCOUNTER — Ambulatory Visit: Payer: Medicare PPO | Attending: Student | Admitting: Occupational Therapy

## 2023-04-15 ENCOUNTER — Encounter: Payer: Self-pay | Admitting: Occupational Therapy

## 2023-04-15 DIAGNOSIS — M79641 Pain in right hand: Secondary | ICD-10-CM | POA: Diagnosis present

## 2023-04-15 DIAGNOSIS — M6281 Muscle weakness (generalized): Secondary | ICD-10-CM | POA: Diagnosis present

## 2023-04-15 DIAGNOSIS — L905 Scar conditions and fibrosis of skin: Secondary | ICD-10-CM | POA: Insufficient documentation

## 2023-04-15 DIAGNOSIS — M25641 Stiffness of right hand, not elsewhere classified: Secondary | ICD-10-CM | POA: Insufficient documentation

## 2023-04-15 NOTE — Therapy (Signed)
OUTPATIENT OCCUPATIONAL THERAPY ORTHO EVALUATION  Patient Name: Patricia Oliver MRN: [id number] DOB:26-Apr-1951, 72 y.o., female Today's Date: 04/15/2023  PCP: Dr Larwance Sachs REFERRING PROVIDER: Horris Latino PA  END OF SESSION:  OT End of Session - 04/15/23 1829     Visit Number 1    Number of Visits 6    Date for OT Re-Evaluation 05/27/23    OT Start Time 1431    OT Stop Time 1514    OT Time Calculation (min) 43 min    Activity Tolerance Patient tolerated treatment well    Behavior During Therapy [facility] for tasks assessed/performed             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, right    COVID-19 02/26/2023   Diverticulitis    GERD (gastroesophageal reflux disease)    Hyperlipidemia    Insomnia    Past Surgical History:  Procedure Laterality Date   ABDOMINAL HYSTERECTOMY     APPENDECTOMY     BREAST BIOPSY Left 1996   neg   BREAST BIOPSY Left 04/06/2021   stereo bx, ribbon clip, neg   COLONOSCOPY N/A 12/10/2014   Procedure: COLONOSCOPY;  Surgeon: Christena Deem, MD;  Location: [facility] ENDOSCOPY;  Service: Endoscopy;  Laterality: N/A;   COLONOSCOPY WITH PROPOFOL N/A 11/21/2020   Procedure: COLONOSCOPY WITH PROPOFOL;  Surgeon: Wyline Mood, MD;  Location: [facility] ENDOSCOPY;  Service: Gastroenterology;  Laterality: N/A;   EXCISION MASS UPPER EXTREMETIES Right 08/09/2021   Procedure: Excision of benign neoplasm right index PIP joint and repair of extensor tendon;  Surgeon: Christena Flake, MD;  Location: [facility] ORS;  Service: Orthopedics;  Laterality: Right;   MASS EXCISION Right 03/19/2023   Procedure: EXCISION OF RECURRENT SOFT TISSUE MASS OF RIGHT INDEX FINGER;  Surgeon: Christena Flake, MD;  Location: [facility] ORS;  Service: Orthopedics;  Laterality: Right;   ORIF ANKLE FRACTURE     Patient Active Problem List   Diagnosis Date Noted   Diverticulitis of large intestine with abscess without bleeding 10/29/2022    Diverticulitis 10/26/2022   Insomnia 10/26/2022   Abdominal pain 10/26/2022   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, right 07/21/2021   Change in bowel habits 10/26/2020   Primary osteoarthritis of left knee 04/30/2018   Allergic rhinitis 11/19/2013   Esophageal reflux 11/19/2013   HLD (hyperlipidemia) 11/19/2013    ONSET DATE: 03/19/23  REFERRING DIAG: R 2nd digit removal benign neoplasm connective tissue   THERAPY DIAG:  Scar condition and fibrosis of skin  Stiffness of right hand, not elsewhere classified  Pain in right hand  Muscle weakness (generalized)  Rationale for Evaluation and Treatment: Rehabilitation  SUBJECTIVE:   SUBJECTIVE STATEMENT: It cam back - seen you year and 1/2 ago-but Dr Joice Lofts did say it can came back -but this time it was not into the tendon Pt accompanied by: self  PERTINENT HISTORY: NOTE 04/01/23 ortho visit  Patricia Oliver is a 72 y.o. female who presents today for her first postop appointment following a excision of recurrent soft tissue mass involving the ulnar aspect of her right index finger. The patient underwent surgery with Dr. Joice Lofts on 03/19/2023. The patient overall was doing well until this past Saturday, 03/30/2023. Apparently on Saturday she began to notice increased swelling or redness surrounding the incision site. This prompted her to contact the provider on-call for orthopedics who prescribed her doxycycline for 7 days. The patient states that  this has helped with the swelling and the redness that she was experiencing around her finger. She denies any fevers or chills at this time. She denies any trauma or injury affecting the right hand. Pain score today is a 0 out of 10. She denies any numbness or tingling right upper extremity. She does report increased stiffness with attempted range of motion but denies significant pain with range of motion activities.  Refer to OT   PRECAUTIONS: None   WEIGHT BEARING RESTRICTIONS: No  PAIN:   Are you having pain? No mostly tight   FALLS: Has patient fallen in last 6 months? 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 - had same surgery 1 1/2 yrs ago - same digit  PATIENT GOALS: Get motion and strength back and scar tissue better   NEXT MD VISIT: 05/03/23  OBJECTIVE:   HAND DOMINANCE: Right     Active ROM Right eval Left eval  Thumb MCP (0-60)    Thumb IP (0-80)    Thumb Radial abd/add (0-55)     Thumb Palmar abd/add (0-45)     Thumb Opposition to Small Finger     Index MCP (0-90) 90    Index PIP (0-100) 100    Index DIP (0-70)  35 PROM 50    Long MCP (0-90)      Long PIP (0-100)      Long DIP (0-70)      Ring MCP (0-90)      Ring PIP (0-100)      Ring DIP (0-70)      Little MCP (0-90)      Little PIP (0-100)      Little DIP (0-70)      (Blank rows = not tested)   HAND FUNCTION: Grip strength: Right: 35 lbs; Left: 35 lbs, Lateral pinch: Right: 13 lbs, Left: 11 lbs, and 3 point pinch: Right: 10 lbs, Left: 9 lbs   SENSATION: WFL   COGNITION: Overall cognitive status: Within functional limits for tasks assessed      TODAY'S TREATMENT:                                                                                                                              DATE: 04/15/23 Done paraffin prior to review of home program.  To increase motion decrease scar tissue Scar massage done by OT and patient educated on scar mobilization Fitted with silicone sleeve larger 1 for nighttime and small 1 for daytime to decrease scar tissue and edema at the PIP PROM to second digit DIP followed by lumbrical fist and then intrinsic a fist with gentle passive range of motion prior to composite flexion to palm. 12 reps after heat or contrast depending on edema to 3 times a day   PATIENT EDUCATION: Education details: findings of eval and HEP  Person educated: Patient Education  method:  Explanation, Demonstration, Tactile cues, Verbal cues, and Handouts Education comprehension: verbalized understanding, returned demonstration, verbal cues required, and needs further education GOALS: Goals reviewed with patient? Yes   LONG TERM GOALS: Target date: 6 wks   Patient to be independent in home program to decrease scar adhesion and tenderness over scar. Baseline: Patient about 4 weeks postop scar tissue adhesion on ulnar side of PIP as well as tenderness 5/10 Goal status: INITIAL  2.  Second digit flexion increased for patient to touch palm to initiate strengthening without increase symptoms Baseline: Second MC 90 degrees and PIP 100 degrees with DIP 35.  Passive range of motion to DIP 50.  Patient unable to touch palm at start of care but during review of home program able to touch. Goal status: INITIAL  3.  Right grip and prehension strength increased for patient with in normal limits for her age to be able to write and pick up, grip objects without favoring and extending second digit. Baseline: Right and left grip strength 35, lateral pinch 13 on the right left 11 and 3-point pinch in the right 10 pounds left 9 pounds with some 2/10 discomfort-patient report favoring second digit using other digits to pick up and grip objects. Goal status: INITIAL  ASSESSMENT: ASSESSMENT:  CLINICAL IMPRESSION: Patient seen today for occupational therapy evaluation for surgical removal of benign neoplasm on ulnar side of R 2nd PIP ulnar side-patient has surgery on 03/19/2023.  Patient did had cellulitis and finished antibiotic prior to OT evaluation.  Patient present with increased heart tissue as well as stiffness mostly at DIP and composite flexion of right dominant second digit.  Patient do report some favoring using other hands to pick up and grip objects.  Grip strength and 3-point pinch appear to be decreased compared to left nondominant hand.  Patient can benefit from skilled OT services to  decrease scar tissue and stiffness and increasing motion and strength to be able to be independent using dominant hand with no increase symptoms.  PERFORMANCE DEFICITS: in functional skills including ADLs, IADLs, ROM, strength, pain, flexibility, and UE functional use,   and psychosocial skills including environmental adaptation and routines and behaviors.   IMPAIRMENTS: are limiting patient from ADLs, IADLs, rest and sleep, play, leisure, and social participation.   COMORBIDITIES: has no other co-morbidities that affects occupational performance. Patient will benefit from skilled OT to address above impairments and improve overall function.  MODIFICATION OR ASSISTANCE TO COMPLETE EVALUATION: No modification of tasks or assist necessary to complete an evaluation.  OT OCCUPATIONAL PROFILE AND HISTORY: Problem focused assessment: Including review of records relating to presenting problem.  CLINICAL DECISION MAKING: LOW - limited treatment options, no task modification necessary  REHAB POTENTIAL: Good for goals  EVALUATION COMPLEXITY: Low   PLAN:  OT FREQUENCY: 1x/week  OT DURATION: 6 weeks PLANNING: Paraffin;Fluidtherapy;Contrast Bath;Manual Therapy;Passive range of motion;Scar mobilization;Therapeutic activities, There ex, pt education    Oletta Cohn, OTR/L,CLT 04/15/2023, 6:31 PM

## 2023-04-23 ENCOUNTER — Ambulatory Visit: Payer: Medicare PPO | Admitting: Occupational Therapy

## 2023-04-30 ENCOUNTER — Ambulatory Visit: Payer: Medicare PPO | Attending: Student | Admitting: Occupational Therapy

## 2023-04-30 ENCOUNTER — Other Ambulatory Visit: Payer: Self-pay

## 2023-04-30 DIAGNOSIS — L905 Scar conditions and fibrosis of skin: Secondary | ICD-10-CM | POA: Diagnosis present

## 2023-04-30 DIAGNOSIS — M6281 Muscle weakness (generalized): Secondary | ICD-10-CM

## 2023-04-30 DIAGNOSIS — M25641 Stiffness of right hand, not elsewhere classified: Secondary | ICD-10-CM | POA: Diagnosis present

## 2023-04-30 DIAGNOSIS — M79641 Pain in right hand: Secondary | ICD-10-CM

## 2023-04-30 MED ORDER — FLUAD 0.5 ML IM SUSY
0.5000 mL | PREFILLED_SYRINGE | Freq: Once | INTRAMUSCULAR | 0 refills | Status: AC
  Filled 2023-04-30: qty 0.5, 1d supply, fill #0

## 2023-04-30 NOTE — Therapy (Addendum)
OUTPATIENT OCCUPATIONAL THERAPY ORTHO TREATMENT  Patient Name: Patricia Oliver MRN: [id number] DOB:10/06/1950, 72 y.o., female Today's Date: 04/30/2023  PCP: Dr Larwance Sachs REFERRING PROVIDER: Horris Latino PA  END OF SESSION:  OT End of Session - 04/30/23 0908     Visit Number 2    Number of Visits 6    Date for OT Re-Evaluation 05/27/23    OT Start Time 0908    OT Stop Time 0940    OT Time Calculation (min) 32 min    Activity Tolerance Patient tolerated treatment well    Behavior During Therapy [facility] for tasks assessed/performed             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, right    COVID-19 02/26/2023   Diverticulitis    GERD (gastroesophageal reflux disease)    Hyperlipidemia    Insomnia    Past Surgical History:  Procedure Laterality Date   ABDOMINAL HYSTERECTOMY     APPENDECTOMY     BREAST BIOPSY Left 1996   neg   BREAST BIOPSY Left 04/06/2021   stereo bx, ribbon clip, neg   COLONOSCOPY N/A 12/10/2014   Procedure: COLONOSCOPY;  Surgeon: Christena Deem, MD;  Location: [facility] ENDOSCOPY;  Service: Endoscopy;  Laterality: N/A;   COLONOSCOPY WITH PROPOFOL N/A 11/21/2020   Procedure: COLONOSCOPY WITH PROPOFOL;  Surgeon: Wyline Mood, MD;  Location: [facility] ENDOSCOPY;  Service: Gastroenterology;  Laterality: N/A;   EXCISION MASS UPPER EXTREMETIES Right 08/09/2021   Procedure: Excision of benign neoplasm right index PIP joint and repair of extensor tendon;  Surgeon: Christena Flake, MD;  Location: [facility] ORS;  Service: Orthopedics;  Laterality: Right;   MASS EXCISION Right 03/19/2023   Procedure: EXCISION OF RECURRENT SOFT TISSUE MASS OF RIGHT INDEX FINGER;  Surgeon: Christena Flake, MD;  Location: [facility] ORS;  Service: Orthopedics;  Laterality: Right;   ORIF ANKLE FRACTURE     Patient Active Problem List   Diagnosis Date Noted   Diverticulitis of large intestine with abscess without bleeding 10/29/2022    Diverticulitis 10/26/2022   Insomnia 10/26/2022   Abdominal pain 10/26/2022   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, right 07/21/2021   Change in bowel habits 10/26/2020   Primary osteoarthritis of left knee 04/30/2018   Allergic rhinitis 11/19/2013   Esophageal reflux 11/19/2013   HLD (hyperlipidemia) 11/19/2013    ONSET DATE: 03/19/23  REFERRING DIAG: R 2nd digit removal benign neoplasm connective tissue   THERAPY DIAG:  Scar condition and fibrosis of skin  Stiffness of right hand, not elsewhere classified  Pain in right hand  Muscle weakness (generalized)  Rationale for Evaluation and Treatment: Rehabilitation  SUBJECTIVE:   SUBJECTIVE STATEMENT: The scar is not as tender ,and stiffness getting better- having my knee done in 2 wks Pt accompanied by: self  PERTINENT HISTORY: NOTE 04/01/23 ortho visit  Patricia Oliver is a 72 y.o. female who presents today for her first postop appointment following a excision of recurrent soft tissue mass involving the ulnar aspect of her right index finger. The patient underwent surgery with Dr. Joice Lofts on 03/19/2023. The patient overall was doing well until this past Saturday, 03/30/2023. Apparently on Saturday she began to notice increased swelling or redness surrounding the incision site. This prompted her to contact the provider on-call for orthopedics who prescribed her doxycycline for 7 days. The patient states that this has helped with the swelling and the redness that  she was experiencing around her finger. She denies any fevers or chills at this time. She denies any trauma or injury affecting the right hand. Pain score today is a 0 out of 10. She denies any numbness or tingling right upper extremity. She does report increased stiffness with attempted range of motion but denies significant pain with range of motion activities.  Refer to OT   PRECAUTIONS: None   WEIGHT BEARING RESTRICTIONS: No  PAIN:  Are you having pain? No mostly tight    FALLS: Has patient fallen in last 6 months? 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 - had same surgery 1 1/2 yrs ago - same digit  PATIENT GOALS: Get motion and strength back and scar tissue better   NEXT MD VISIT: 05/03/23  OBJECTIVE:   HAND DOMINANCE: Right     Active ROM Right eval Left eval R 04/30/23  Thumb MCP (0-60)     Thumb IP (0-80)     Thumb Radial abd/add (0-55)      Thumb Palmar abd/add (0-45)      Thumb Opposition to Small Finger      Index MCP (0-90) 90   90  Index PIP (0-100) 100   100  Index DIP (0-70)  35 PROM 50   40/ PROM 50   Long MCP (0-90)       Long PIP (0-100)       Long DIP (0-70)       Ring MCP (0-90)       Ring PIP (0-100)       Ring DIP (0-70)       Little MCP (0-90)       Little PIP (0-100)       Little DIP (0-70)       (Blank rows = not tested)   HAND FUNCTION: Grip strength: Right: 35 lbs; Left: 35 lbs, Lateral pinch: Right: 13 lbs, Left: 11 lbs, and 3 point pinch: Right: 10 lbs, Left: 9 lbs  04/30/23: Grip strength: Right: 42 lbs; Left: 37 lbs, Lateral pinch: Right: 13 lbs, Left: 11 lbs, and 3 point pinch: Right: 11 lbs, Left: 9 lbs   SENSATION: WFL   COGNITION: Overall cognitive status: Within functional limits for tasks assessed      TODAY'S TREATMENT:                                                                                                                              DATE: 04/30/23 Made progress since 2 wks ago -in scar tissue, DIP AROM and grip and 3 point pinch  See flowsheet  Done paraffin prior to scar massage and ROM  Scar massage done by OT and patient educated on scar mobilization- provided coban - to use during scar mobs -to distal 1 cm of lateral scar at PIP of 2nd   Provided new  silicone sleeve larger 1 for nighttime and small 1 for daytime to decrease scar tissue and edema at the PIP PROM to second digit DIP,  prolonged flexion to DIP -  followed by lumbrical fist and then intrinsic a fist with passive range of motion prior to composite flexion to palm. 12 reps Pt to cont to use heat prior to ROM and scar massage    PATIENT EDUCATION: Education details: findings of eval and HEP  Person educated: Patient Education method: Explanation, Demonstration, Tactile cues, Verbal cues, and Handouts Education comprehension: verbalized understanding, returned demonstration, verbal cues required, and needs further education GOALS: Goals reviewed with patient? Yes   LONG TERM GOALS: Target date: 6 wks   Patient to be independent in home program to decrease scar adhesion and tenderness over scar. Baseline: Patient about 4 weeks postop scar tissue adhesion on ulnar side of PIP as well as tenderness 5/10 Goal status: INITIAL  2.  Second digit flexion increased for patient to touch palm to initiate strengthening without increase symptoms Baseline: Second MC 90 degrees and PIP 100 degrees with DIP 35.  Passive range of motion to DIP 50.  Patient unable to touch palm at start of care but during review of home program able to touch. Goal status: INITIAL  3.  Right grip and prehension strength increased for patient with in normal limits for her age to be able to write and pick up, grip objects without favoring and extending second digit. Baseline: Right and left grip strength 35, lateral pinch 13 on the right left 11 and 3-point pinch in the right 10 pounds left 9 pounds with some 2/10 discomfort-patient report favoring second digit using other digits to pick up and grip objects. Goal status: INITIAL  ASSESSMENT: ASSESSMENT:  CLINICAL IMPRESSION: Patient seen by occupational therapy  for surgical removal of benign neoplasm on ulnar side of R 2nd PIP ulnar side-patient has surgery on 03/19/2023.  Patient did had cellulitis and finished antibiotic prior to OT evaluation.  Patient present at eval with increased scar  tissue as well as stiffness mostly at DIP and composite flexion of right dominant second digit.  Patient do report some favoring using other hands to pick up and grip objects.  Grip strength and 3-point pinch appear to be decreased compared to left nondominant hand.  This date pt present at follow up with progress  in tenderness at scar tissue, increase DIP flexion , increase grip and prehension - able to do precision pinch open small objects, write with pen and using computer. Pt wants to follow up one more session - is schedule for TKA on 05/16/23. Patient can benefit from skilled OT services to decrease scar tissue and stiffness and increasing motion and strength to be able to be independent using dominant hand with no increase symptoms.  PERFORMANCE DEFICITS: in functional skills including ADLs, IADLs, ROM, strength, pain, flexibility, and UE functional use,   and psychosocial skills including environmental adaptation and routines and behaviors.   IMPAIRMENTS: are limiting patient from ADLs, IADLs, rest and sleep, play, leisure, and social participation.   COMORBIDITIES: has no other co-morbidities that affects occupational performance. Patient will benefit from skilled OT to address above impairments and improve overall function.  MODIFICATION OR ASSISTANCE TO COMPLETE EVALUATION: No modification of tasks or assist necessary to complete an evaluation.  OT OCCUPATIONAL PROFILE AND HISTORY: Problem focused assessment: Including review of records relating to presenting problem.  CLINICAL DECISION MAKING: LOW - limited treatment options, no task modification  necessary  REHAB POTENTIAL: Good for goals  EVALUATION COMPLEXITY: Low   PLAN:  OT FREQUENCY: biweekly  OT DURATION: 6 weeks PLANNING: Paraffin;Fluidtherapy;Contrast Bath;Manual Therapy;Passive range of motion;Scar mobilization;Therapeutic activities, There ex, pt education    Oletta Cohn, OTR/L,CLT 04/30/2023, 9:49 AM

## 2023-05-02 ENCOUNTER — Other Ambulatory Visit: Payer: Self-pay | Admitting: Medical Genetics

## 2023-05-02 DIAGNOSIS — Z006 Encounter for examination for normal comparison and control in clinical research program: Secondary | ICD-10-CM

## 2023-05-08 ENCOUNTER — Other Ambulatory Visit: Payer: Self-pay | Admitting: Orthopedic Surgery

## 2023-05-08 ENCOUNTER — Ambulatory Visit
Admission: RE | Admit: 2023-05-08 | Discharge: 2023-05-08 | Disposition: A | Discharge status: Home / Self Care | Payer: Medicare PPO | Source: Ambulatory Visit | Attending: Orthopedic Surgery | Admitting: Orthopedic Surgery

## 2023-05-08 ENCOUNTER — Other Ambulatory Visit: Payer: Self-pay

## 2023-05-08 DIAGNOSIS — M1712 Unilateral primary osteoarthritis, left knee: Secondary | ICD-10-CM | POA: Diagnosis present

## 2023-05-08 MED ORDER — TRAMADOL HCL 50 MG PO TABS
50.0000 mg | ORAL_TABLET | Freq: Three times a day (TID) | ORAL | 0 refills | Status: DC | PRN
  Filled 2023-05-08 (×2): qty 20, 7d supply, fill #0

## 2023-05-10 ENCOUNTER — Other Ambulatory Visit: Payer: Self-pay | Admitting: Surgery

## 2023-05-11 ENCOUNTER — Other Ambulatory Visit: Payer: Self-pay

## 2023-05-11 MED ORDER — TRAMADOL HCL 50 MG PO TABS
50.0000 mg | ORAL_TABLET | Freq: Four times a day (QID) | ORAL | 0 refills | Status: DC | PRN
  Filled 2023-05-11: qty 30, 4d supply, fill #0

## 2023-05-12 ENCOUNTER — Other Ambulatory Visit: Payer: Self-pay

## 2023-05-13 ENCOUNTER — Encounter
Admission: RE | Admit: 2023-05-13 | Discharge: 2023-05-13 | Disposition: A | Discharge status: Home / Self Care | Payer: Medicare PPO | Source: Ambulatory Visit | Attending: Surgery | Admitting: Surgery

## 2023-05-13 ENCOUNTER — Other Ambulatory Visit: Payer: Self-pay

## 2023-05-13 ENCOUNTER — Ambulatory Visit: Payer: Medicare PPO | Admitting: Occupational Therapy

## 2023-05-13 VITALS — BP 139/87 | HR 70 | Temp 97.8°F | Resp 13 | Ht 63.0 in | Wt 143.0 lb

## 2023-05-13 DIAGNOSIS — Z01812 Encounter for preprocedural laboratory examination: Secondary | ICD-10-CM

## 2023-05-13 DIAGNOSIS — Z01818 Encounter for other preprocedural examination: Secondary | ICD-10-CM | POA: Insufficient documentation

## 2023-05-13 HISTORY — DX: Cortical age-related cataract, left eye: H25.012

## 2023-05-13 HISTORY — DX: Diffuse cystic mastopathy of unspecified breast: N60.19

## 2023-05-13 HISTORY — DX: Diverticulosis of intestine, part unspecified, without perforation or abscess without bleeding: K57.90

## 2023-05-13 HISTORY — DX: Unilateral primary osteoarthritis, left knee: M17.12

## 2023-05-13 HISTORY — DX: Other specified disorders of bone density and structure, unspecified site: M85.80

## 2023-05-13 HISTORY — DX: Polyp of colon: K63.5

## 2023-05-13 LAB — COMPREHENSIVE METABOLIC PANEL
ALT: 33 U/L (ref 0–44)
AST: 30 U/L (ref 15–41)
Albumin: 4.3 g/dL (ref 3.5–5.0)
Alkaline Phosphatase: 74 U/L (ref 38–126)
Anion gap: 7 (ref 5–15)
BUN: 23 mg/dL (ref 8–23)
CO2: 29 mmol/L (ref 22–32)
Calcium: 9.6 mg/dL (ref 8.9–10.3)
Chloride: 103 mmol/L (ref 98–111)
Creatinine, Ser: 0.68 mg/dL (ref 0.44–1.00)
GFR, Estimated: >60 mL/min (ref >60)
Glucose, Bld: 81 mg/dL (ref 70–99)
Potassium: 3.6 mmol/L (ref 3.5–5.1)
Sodium: 139 mmol/L (ref 135–145)
Total Bilirubin: 0.9 mg/dL (ref 0.3–1.2)
Total Protein: 7.1 g/dL (ref 6.5–8.1)

## 2023-05-13 LAB — SURGICAL PCR SCREEN
MRSA, PCR: NEGATIVE
Staphylococcus aureus: NEGATIVE

## 2023-05-13 LAB — CBC WITH DIFFERENTIAL/PLATELET
Abs Immature Granulocytes: 0.01 10*3/uL (ref 0.00–0.07)
Basophils Absolute: 0 10*3/uL (ref 0.0–0.1)
Basophils Relative: 1 %
Eosinophils Absolute: 0.2 10*3/uL (ref 0.0–0.5)
Eosinophils Relative: 3 %
HCT: 45.4 % (ref 36.0–46.0)
Hemoglobin: 14.4 g/dL (ref 12.0–15.0)
Immature Granulocytes: 0 %
Lymphocytes Relative: 21 %
Lymphs Abs: 1.2 10*3/uL (ref 0.7–4.0)
MCH: 27.2 pg (ref 26.0–34.0)
MCHC: 31.7 g/dL (ref 30.0–36.0)
MCV: 85.8 fL (ref 80.0–100.0)
Monocytes Absolute: 0.4 10*3/uL (ref 0.1–1.0)
Monocytes Relative: 6 %
Neutro Abs: 4.1 10*3/uL (ref 1.7–7.7)
Neutrophils Relative %: 69 %
Platelets: 214 10*3/uL (ref 150–400)
RBC: 5.29 MIL/uL — ABNORMAL HIGH (ref 3.87–5.11)
RDW: 13.9 % (ref 11.5–15.5)
WBC: 5.9 10*3/uL (ref 4.0–10.5)
nRBC: 0 % (ref 0.0–0.2)

## 2023-05-13 LAB — URINALYSIS, ROUTINE W REFLEX MICROSCOPIC
Bilirubin Urine: NEGATIVE
Glucose, UA: NEGATIVE mg/dL
Hgb urine dipstick: NEGATIVE
Ketones, ur: NEGATIVE mg/dL
Leukocytes,Ua: NEGATIVE
Nitrite: NEGATIVE
Protein, ur: NEGATIVE mg/dL
Specific Gravity, Urine: 1.025 (ref 1.005–1.030)
pH: 5 (ref 5.0–8.0)

## 2023-05-13 NOTE — Patient Instructions (Addendum)
Your procedure is scheduled on: Thursday, October 24 Report to the Registration Desk on the 1st floor of the [facility] To find out your arrival time, please call [phone] between 1PM - 3PM on: Wednesday, October 23 If your arrival time is 6:00 am, do not arrive before that time as the [facility] entrance doors do not open until 6:00 am.  REMEMBER: Instructions that are not followed completely may result in serious medical risk, up to and including death; or upon the discretion of your surgeon and anesthesiologist your surgery may need to be rescheduled.  Do not eat food after midnight the night before surgery.  No gum chewing or hard candies.  You may however, drink CLEAR liquids up to 2 hours before you are scheduled to arrive for your surgery. Do not drink anything within 2 hours of your scheduled arrival time.  Clear liquids include: - water  - apple juice without pulp - gatorade (not RED colors) - black coffee or tea (Do NOT add milk or creamers to the coffee or tea) Do NOT drink anything that is not on this list.  In addition, your doctor has ordered for you to drink the provided:  Ensure Pre-Surgery Clear Carbohydrate Drink  Drinking this carbohydrate drink up to two hours before surgery helps to reduce insulin resistance and improve patient outcomes. Please complete drinking 2 hours before scheduled arrival time.  One week prior to surgery:  Stop Anti-inflammatories (NSAIDS) such as Advil, Aleve, Ibuprofen, Motrin, Naproxen, Naprosyn and Aspirin based products such as Excedrin, Goody's Powder, BC Powder. Stop ANY OVER THE COUNTER supplements until after surgery. Stop vitamin C, calcium, glucosamin, vitamin E.  You may however, continue to take Tylenol if needed for pain up until the day of surgery.  Continue taking all of your other prescription medications up until the day of surgery.  ON THE DAY OF SURGERY ONLY TAKE THESE MEDICATIONS WITH SIPS OF  WATER:  Famotidine (Pepcid) Flonase nasal spray Tramadol if needed for pain Regular morning Eye drops  No Alcohol for 24 hours before or after surgery.  No Smoking including e-cigarettes for 24 hours before surgery.  No chewable tobacco products for at least 6 hours before surgery.  No nicotine patches on the day of surgery.  Do not use any "recreational" drugs for at least a week (preferably 2 weeks) before your surgery.  Please be advised that the combination of cocaine and anesthesia may have negative outcomes, up to and including death. If you test positive for cocaine, your surgery will be cancelled.  On the morning of surgery brush your teeth with toothpaste and water, you may rinse your mouth with mouthwash if you wish. Do not swallow any toothpaste or mouthwash.  Use CHG Soap as directed on instruction sheet.  Do not wear jewelry, make-up, hairpins, clips or nail polish.  For welded (permanent) jewelry: bracelets, anklets, waist bands, etc.  Please have this removed prior to surgery.  If it is not removed, there is a chance that hospital personnel will need to cut it off on the day of surgery.  Do not wear lotions, powders, or perfumes.   Do not shave body hair from the neck down 48 hours before surgery.  Contact lenses, hearing aids and dentures may not be worn into surgery.  Do not bring valuables to the hospital. [facility] is not responsible for any missing/lost belongings or valuables.   Notify your doctor if there is any change in your medical condition (cold,  fever, infection).  Wear comfortable clothing (specific to your surgery type) to the hospital.  After surgery, you can help prevent lung complications by doing breathing exercises.  Take deep breaths and cough every 1-2 hours. Your doctor may order a device called an Incentive Spirometer to help you take deep breaths.  If you are being admitted to the hospital overnight, leave your suitcase in the car.  After surgery it may be brought to your room.  In case of increased patient census, it may be necessary for you, the patient, to continue your postoperative care in the Same Day Surgery department.  If you are being discharged the day of surgery, you will not be allowed to drive home. You will need a responsible individual to drive you home and stay with you for 24 hours after surgery.   If you are taking public transportation, you will need to have a responsible individual with you.  Please call the Pre-admissions Testing Dept. at [phone] if you have any questions about these instructions.  Surgery Visitation Policy:  Patients having surgery or a procedure may have two visitors.  Children under the age of 48 must have an adult with them who is not the patient.  Inpatient Visitation:    Visiting hours are 7 a.m. to 8 p.m. Up to four visitors are allowed at one time in a patient room. The visitors may rotate out with other people during the day.  One visitor age 71 or older may stay with the patient overnight and must be in the room by 8 p.m.    Pre-operative 5 CHG Bath Instructions   You can play a key role in reducing the risk of infection after surgery. Your skin needs to be as free of germs as possible. You can reduce the number of germs on your skin by washing with CHG (chlorhexidine gluconate) soap before surgery. CHG is an antiseptic soap that kills germs and continues to kill germs even after washing.   DO NOT use if you have an allergy to chlorhexidine/CHG or antibacterial soaps. If your skin becomes reddened or irritated, stop using the CHG and notify one of our RNs at [phone].   Please shower with the CHG soap starting 4 days before surgery using the following schedule:     Please keep in mind the following:  DO NOT shave, including legs and underarms, starting the day of your first shower.   You may shave your face at any point before/day of surgery.  Place  clean sheets on your bed the day you start using CHG soap. Use a clean washcloth (not used since being washed) for each shower. DO NOT sleep with pets once you start using the CHG.   CHG Shower Instructions:  If you choose to wash your hair and private area, wash first with your normal shampoo/soap.  After you use shampoo/soap, rinse your hair and body thoroughly to remove shampoo/soap residue.  Turn the water OFF and apply about 3 tablespoons (45 ml) of CHG soap to a CLEAN washcloth.  Apply CHG soap ONLY FROM YOUR NECK DOWN TO YOUR TOES (washing for 3-5 minutes)  DO NOT use CHG soap on face, private areas, open wounds, or sores.  Pay special attention to the area where your surgery is being performed.  If you are having back surgery, having someone wash your back for you may be helpful. Wait 2 minutes after CHG soap is applied, then you may rinse off the CHG soap.  Pat dry with a clean towel  Put on clean clothes/pajamas   If you choose to wear lotion, please use ONLY the CHG-compatible lotions on the back of this paper.     Additional instructions for the day of surgery: DO NOT APPLY any lotions, deodorants, cologne, or perfumes.   Put on clean/comfortable clothes.  Brush your teeth.  Ask your nurse before applying any prescription medications to the skin.      CHG Compatible Lotions   Aveeno Moisturizing lotion  Cetaphil Moisturizing Cream  Cetaphil Moisturizing Lotion  Clairol Herbal Essence Moisturizing Lotion, Dry Skin  Clairol Herbal Essence Moisturizing Lotion, Extra Dry Skin  Clairol Herbal Essence Moisturizing Lotion, Normal Skin  Curel Age Defying Therapeutic Moisturizing Lotion with Alpha Hydroxy  Curel Extreme Care Body Lotion  Curel Soothing Hands Moisturizing Hand Lotion  Curel Therapeutic Moisturizing Cream, Fragrance-Free  Curel Therapeutic Moisturizing Lotion, Fragrance-Free  Curel Therapeutic Moisturizing Lotion, Original Formula  Eucerin Daily Replenishing  Lotion  Eucerin Dry Skin Therapy Plus Alpha Hydroxy Crme  Eucerin Dry Skin Therapy Plus Alpha Hydroxy Lotion  Eucerin Original Crme  Eucerin Original Lotion  Eucerin Plus Crme Eucerin Plus Lotion  Eucerin TriLipid Replenishing Lotion  Keri Anti-Bacterial Hand Lotion  Keri Deep Conditioning Original Lotion Dry Skin Formula Softly Scented  Keri Deep Conditioning Original Lotion, Fragrance Free Sensitive Skin Formula  Keri Lotion Fast Absorbing Fragrance Free Sensitive Skin Formula  Keri Lotion Fast Absorbing Softly Scented Dry Skin Formula  Keri Original Lotion  Keri Skin Renewal Lotion Keri Silky Smooth Lotion  Keri Silky Smooth Sensitive Skin Lotion  Nivea Body Creamy Conditioning Oil  Nivea Body Extra Enriched Lotion  Nivea Body Original Lotion  Nivea Body Sheer Moisturizing Lotion Nivea Crme  Nivea Skin Firming Lotion  NutraDerm 30 Skin Lotion  NutraDerm Skin Lotion  NutraDerm Therapeutic Skin Cream  NutraDerm Therapeutic Skin Lotion  ProShield Protective Hand Cream  Provon moisturizing lotion     Preoperative Educational Videos for Total Hip, Knee and Shoulder Replacements  To better prepare for surgery, please view our videos that explain the physical activity and discharge planning required to have the best surgical recovery at [facility].  [email or web address]  Questions? Call [phone] or email [email or web address]

## 2023-05-15 ENCOUNTER — Other Ambulatory Visit: Payer: Self-pay

## 2023-05-16 ENCOUNTER — Encounter
Admission: RE | Disposition: A | Discharge status: Home / Self Care | Payer: Self-pay | Source: Home / Self Care | Attending: Surgery

## 2023-05-16 ENCOUNTER — Ambulatory Visit: Payer: Medicare PPO

## 2023-05-16 ENCOUNTER — Ambulatory Visit: Payer: Medicare PPO | Admitting: Certified Registered Nurse Anesthetist

## 2023-05-16 ENCOUNTER — Ambulatory Visit
Admission: RE | Admit: 2023-05-16 | Discharge: 2023-05-17 | Disposition: A | Discharge status: Home / Self Care | Payer: Medicare PPO | Attending: Surgery | Admitting: Surgery

## 2023-05-16 ENCOUNTER — Ambulatory Visit: Payer: Self-pay | Admitting: Urgent Care

## 2023-05-16 ENCOUNTER — Encounter: Payer: Self-pay | Admitting: Surgery

## 2023-05-16 ENCOUNTER — Other Ambulatory Visit: Payer: Self-pay

## 2023-05-16 DIAGNOSIS — M1712 Unilateral primary osteoarthritis, left knee: Secondary | ICD-10-CM | POA: Insufficient documentation

## 2023-05-16 DIAGNOSIS — K219 Gastro-esophageal reflux disease without esophagitis: Secondary | ICD-10-CM | POA: Diagnosis not present

## 2023-05-16 DIAGNOSIS — Z96652 Presence of left artificial knee joint: Secondary | ICD-10-CM

## 2023-05-16 HISTORY — PX: TOTAL KNEE ARTHROPLASTY: SHX125

## 2023-05-16 LAB — TYPE AND SCREEN
ABO/RH(D): O POS
Antibody Screen: NEGATIVE

## 2023-05-16 LAB — ABO/RH: ABO/RH(D): O POS

## 2023-05-16 SURGERY — ARTHROPLASTY, KNEE, TOTAL
Anesthesia: General | Site: Knee | Laterality: Left

## 2023-05-16 MED ORDER — TRANEXAMIC ACID-NACL 1000-0.7 MG/100ML-% IV SOLN
1000.0000 mg | INTRAVENOUS | Status: AC
  Administered 2023-05-16: 1000 mg via INTRAVENOUS

## 2023-05-16 MED ORDER — FENTANYL CITRATE (PF) 100 MCG/2ML IJ SOLN: 25.0000 ug | INTRAMUSCULAR | Status: DC | PRN

## 2023-05-16 MED ORDER — KETOROLAC TROMETHAMINE 15 MG/ML IJ SOLN
INTRAMUSCULAR | Status: AC
Start: 2023-05-16 — End: ?
  Filled 2023-05-16: qty 1

## 2023-05-16 MED ORDER — ORAL CARE MOUTH RINSE: 15.0000 mL | Freq: Once | OROMUCOSAL | Status: AC

## 2023-05-16 MED ORDER — DICYCLOMINE HCL 10 MG PO CAPS
10.0000 mg | ORAL_CAPSULE | Freq: Two times a day (BID) | ORAL | Status: DC
  Administered 2023-05-17: 10 mg via ORAL
  Filled 2023-05-16: qty 1

## 2023-05-16 MED ORDER — FAMOTIDINE 20 MG PO TABS
40.0000 mg | ORAL_TABLET | Freq: Every day | ORAL | Status: DC
  Administered 2023-05-17: 40 mg via ORAL

## 2023-05-16 MED ORDER — PHENYLEPHRINE HCL-NACL 20-0.9 MG/250ML-% IV SOLN
INTRAVENOUS | Status: DC | PRN
  Administered 2023-05-16: 16 ug/min via INTRAVENOUS

## 2023-05-16 MED ORDER — TRANEXAMIC ACID-NACL 1000-0.7 MG/100ML-% IV SOLN
INTRAVENOUS | Status: AC
  Filled 2023-05-16: qty 100

## 2023-05-16 MED ORDER — METOCLOPRAMIDE HCL 10 MG PO TABS
5.0000 mg | ORAL_TABLET | Freq: Three times a day (TID) | ORAL | Status: DC | PRN
Start: 2023-05-16 — End: 2023-05-17

## 2023-05-16 MED ORDER — KETOROLAC TROMETHAMINE 15 MG/ML IJ SOLN
INTRAMUSCULAR | Status: AC
  Filled 2023-05-16: qty 1

## 2023-05-16 MED ORDER — SODIUM CHLORIDE FLUSH 0.9 % IV SOLN
INTRAVENOUS | Status: AC
  Filled 2023-05-16: qty 40

## 2023-05-16 MED ORDER — SODIUM CHLORIDE 0.9 % BOLUS PEDS: 250.0000 mL | Freq: Once | INTRAVENOUS | Status: DC

## 2023-05-16 MED ORDER — ACETAMINOPHEN 325 MG PO TABS: 325.0000 mg | ORAL_TABLET | Freq: Four times a day (QID) | ORAL | Status: DC | PRN

## 2023-05-16 MED ORDER — AZELASTINE HCL 0.05 % OP SOLN: 1.0000 [drp] | Freq: Every day | OPHTHALMIC | Status: AC

## 2023-05-16 MED ORDER — ACETAMINOPHEN 500 MG PO TABS
1000.0000 mg | ORAL_TABLET | Freq: Four times a day (QID) | ORAL | Status: DC
  Administered 2023-05-16 – 2023-05-17 (×3): 1000 mg via ORAL

## 2023-05-16 MED ORDER — PREDNISOLONE ACETATE 1 % OP SUSP
1.0000 [drp] | Freq: Every day | OPHTHALMIC | Status: DC
Start: 2023-05-16 — End: 2023-05-16
  Filled 2023-05-16: qty 1

## 2023-05-16 MED ORDER — APIXABAN 2.5 MG PO TABS: 2.5000 mg | ORAL_TABLET | Freq: Two times a day (BID) | ORAL | 0 refills | Status: DC

## 2023-05-16 MED ORDER — ONDANSETRON HCL 4 MG/2ML IJ SOLN
4.0000 mg | Freq: Four times a day (QID) | INTRAMUSCULAR | Status: DC | PRN
Start: 2023-05-16 — End: 2023-05-16

## 2023-05-16 MED ORDER — OXYCODONE HCL 5 MG PO TABS
5.0000 mg | ORAL_TABLET | ORAL | 0 refills | Status: DC | PRN
Start: 2023-05-16 — End: 2023-05-16

## 2023-05-16 MED ORDER — ONDANSETRON HCL 4 MG PO TABS: 4.0000 mg | ORAL_TABLET | Freq: Four times a day (QID) | ORAL | Status: DC | PRN

## 2023-05-16 MED ORDER — FENTANYL CITRATE (PF) 100 MCG/2ML IJ SOLN
INTRAMUSCULAR | Status: AC
  Filled 2023-05-16: qty 2

## 2023-05-16 MED ORDER — BUPIVACAINE-EPINEPHRINE (PF) 0.5% -1:200000 IJ SOLN
INTRAMUSCULAR | Status: AC
  Filled 2023-05-16: qty 10

## 2023-05-16 MED ORDER — LACTATED RINGERS IV BOLUS
250.0000 mL | Freq: Once | INTRAVENOUS | Status: AC
  Administered 2023-05-16: 250 mL via INTRAVENOUS

## 2023-05-16 MED ORDER — DOCUSATE SODIUM 100 MG PO CAPS
100.0000 mg | ORAL_CAPSULE | Freq: Two times a day (BID) | ORAL | Status: DC
  Administered 2023-05-16 – 2023-05-17 (×2): 100 mg via ORAL

## 2023-05-16 MED ORDER — KETOROLAC TROMETHAMINE 15 MG/ML IJ SOLN
7.5000 mg | Freq: Four times a day (QID) | INTRAMUSCULAR | Status: DC
  Administered 2023-05-16 – 2023-05-17 (×2): 7.5 mg via INTRAVENOUS

## 2023-05-16 MED ORDER — VITAFUSION FIBER WELL/PRO PO CHEW: CHEWABLE_TABLET | Freq: Every day | ORAL | Status: DC

## 2023-05-16 MED ORDER — OXYCODONE HCL 5 MG PO TABS
5.0000 mg | ORAL_TABLET | Freq: Once | ORAL | Status: AC | PRN
  Administered 2023-05-16: 5 mg via ORAL

## 2023-05-16 MED ORDER — OXYCODONE HCL 5 MG PO TABS
5.0000 mg | ORAL_TABLET | ORAL | Status: DC | PRN
  Administered 2023-05-16 – 2023-05-17 (×3): 5 mg via ORAL

## 2023-05-16 MED ORDER — GATIFLOXACIN 0.5 % OP SOLN: 1.0000 [drp] | Freq: Four times a day (QID) | OPHTHALMIC | Status: DC

## 2023-05-16 MED ORDER — PROPOFOL 1000 MG/100ML IV EMUL
INTRAVENOUS | Status: AC
  Filled 2023-05-16: qty 100

## 2023-05-16 MED ORDER — ACETAMINOPHEN 10 MG/ML IV SOLN
INTRAVENOUS | Status: AC
  Filled 2023-05-16: qty 100

## 2023-05-16 MED ORDER — FLUTICASONE PROPIONATE 50 MCG/ACT NA SUSP: 2.0000 | Freq: Every day | NASAL | Status: DC | PRN

## 2023-05-16 MED ORDER — MIDAZOLAM HCL 5 MG/5ML IJ SOLN
INTRAMUSCULAR | Status: DC | PRN
  Administered 2023-05-16: 1 mg via INTRAVENOUS

## 2023-05-16 MED ORDER — OXYCODONE HCL 5 MG PO TABS: 5.0000 mg | ORAL_TABLET | ORAL | 0 refills | Status: AC | PRN

## 2023-05-16 MED ORDER — PREDNISOLONE-MOXIFLOXACIN 1-0.5 % OP SOLN: 1.0000 [drp] | Freq: Four times a day (QID) | OPHTHALMIC | Status: DC

## 2023-05-16 MED ORDER — APIXABAN 2.5 MG PO TABS
2.5000 mg | ORAL_TABLET | Freq: Two times a day (BID) | ORAL | Status: DC
Start: 2023-05-17 — End: 2023-05-17
  Administered 2023-05-17: 2.5 mg via ORAL

## 2023-05-16 MED ORDER — ONDANSETRON HCL 4 MG/2ML IJ SOLN
INTRAMUSCULAR | Status: DC | PRN
  Administered 2023-05-16: 4 mg via INTRAVENOUS

## 2023-05-16 MED ORDER — CEFAZOLIN SODIUM-DEXTROSE 2-4 GM/100ML-% IV SOLN
2.0000 g | INTRAVENOUS | Status: AC
  Administered 2023-05-16: 2 g via INTRAVENOUS

## 2023-05-16 MED ORDER — KETOROLAC TROMETHAMINE 30 MG/ML IJ SOLN
INTRAMUSCULAR | Status: DC | PRN
  Administered 2023-05-16: 30 mg via INTRAMUSCULAR

## 2023-05-16 MED ORDER — FLEET ENEMA RE ENEM: 1.0000 | ENEMA | Freq: Once | RECTAL | Status: DC | PRN

## 2023-05-16 MED ORDER — CEFAZOLIN SODIUM-DEXTROSE 2-4 GM/100ML-% IV SOLN
INTRAVENOUS | Status: AC
  Filled 2023-05-16: qty 100

## 2023-05-16 MED ORDER — OXYCODONE HCL 5 MG PO TABS
ORAL_TABLET | ORAL | Status: AC
  Filled 2023-05-16: qty 1

## 2023-05-16 MED ORDER — MIDAZOLAM HCL 2 MG/2ML IJ SOLN
INTRAMUSCULAR | Status: AC
  Filled 2023-05-16: qty 2

## 2023-05-16 MED ORDER — MAGNESIUM HYDROXIDE 400 MG/5ML PO SUSP: 30.0000 mL | Freq: Every day | ORAL | Status: DC | PRN

## 2023-05-16 MED ORDER — DIPHENHYDRAMINE HCL 12.5 MG/5ML PO ELIX: 12.5000 mg | ORAL_SOLUTION | ORAL | Status: DC | PRN

## 2023-05-16 MED ORDER — APIXABAN 2.5 MG PO TABS: 2.5000 mg | ORAL_TABLET | Freq: Two times a day (BID) | ORAL | 0 refills | Status: AC

## 2023-05-16 MED ORDER — OYSTER SHELL CALCIUM/D3 500-5 MG-MCG PO TABS
1.0000 | ORAL_TABLET | Freq: Two times a day (BID) | ORAL | Status: DC
  Administered 2023-05-16 – 2023-05-17 (×2): 1 via ORAL
  Filled 2023-05-16 (×2): qty 1

## 2023-05-16 MED ORDER — DOCUSATE SODIUM 100 MG PO CAPS
ORAL_CAPSULE | ORAL | Status: AC
  Filled 2023-05-16: qty 1

## 2023-05-16 MED ORDER — MONTELUKAST SODIUM 10 MG PO TABS
10.0000 mg | ORAL_TABLET | Freq: Every day | ORAL | Status: DC
  Administered 2023-05-16: 10 mg via ORAL
  Filled 2023-05-16: qty 1

## 2023-05-16 MED ORDER — ACETAMINOPHEN 500 MG PO TABS
ORAL_TABLET | ORAL | Status: AC
  Filled 2023-05-16: qty 2

## 2023-05-16 MED ORDER — BUPIVACAINE HCL (PF) 0.5 % IJ SOLN
INTRAMUSCULAR | Status: DC | PRN
  Administered 2023-05-16: 3 mL via INTRATHECAL

## 2023-05-16 MED ORDER — ONDANSETRON HCL 4 MG/2ML IJ SOLN: 4.0000 mg | Freq: Four times a day (QID) | INTRAMUSCULAR | Status: DC | PRN

## 2023-05-16 MED ORDER — BUPIVACAINE LIPOSOME 1.3 % IJ SUSP
INTRAMUSCULAR | Status: AC
  Filled 2023-05-16: qty 20

## 2023-05-16 MED ORDER — METOCLOPRAMIDE HCL 10 MG PO TABS: 5.0000 mg | ORAL_TABLET | Freq: Three times a day (TID) | ORAL | Status: DC | PRN

## 2023-05-16 MED ORDER — ACETAMINOPHEN 10 MG/ML IV SOLN
INTRAVENOUS | Status: DC | PRN
  Administered 2023-05-16: 1000 mg via INTRAVENOUS

## 2023-05-16 MED ORDER — METOCLOPRAMIDE HCL 5 MG/ML IJ SOLN: 5.0000 mg | Freq: Three times a day (TID) | INTRAMUSCULAR | Status: DC | PRN

## 2023-05-16 MED ORDER — DEXTROSE-SODIUM CHLORIDE 5-0.9 % IV SOLN: INTRAVENOUS | Status: DC

## 2023-05-16 MED ORDER — TRIAMCINOLONE ACETONIDE 40 MG/ML IJ SUSP
INTRAMUSCULAR | Status: AC
  Filled 2023-05-16: qty 2

## 2023-05-16 MED ORDER — ZOLPIDEM TARTRATE 5 MG PO TABS: 5.0000 mg | ORAL_TABLET | Freq: Every evening | ORAL | Status: DC | PRN

## 2023-05-16 MED ORDER — TRIAMCINOLONE ACETONIDE 40 MG/ML IJ SUSP
INTRAMUSCULAR | Status: DC | PRN
  Administered 2023-05-16: 80 mg

## 2023-05-16 MED ORDER — PROPOFOL 500 MG/50ML IV EMUL
INTRAVENOUS | Status: DC | PRN
  Administered 2023-05-16: 20 mg via INTRAVENOUS
  Administered 2023-05-16: 85 ug/kg/min via INTRAVENOUS

## 2023-05-16 MED ORDER — SODIUM CHLORIDE 0.9 % IV SOLN
INTRAVENOUS | Status: DC | PRN
  Administered 2023-05-16: 60 mL

## 2023-05-16 MED ORDER — PREDNISOLONE ACETATE 1 % OP SUSP: 1.0000 [drp] | Freq: Four times a day (QID) | OPHTHALMIC | Status: DC

## 2023-05-16 MED ORDER — BISACODYL 10 MG RE SUPP: 10.0000 mg | Freq: Every day | RECTAL | Status: DC | PRN

## 2023-05-16 MED ORDER — OXYCODONE HCL 5 MG/5ML PO SOLN: 5.0000 mg | Freq: Once | ORAL | Status: AC | PRN

## 2023-05-16 MED ORDER — CEFAZOLIN SODIUM-DEXTROSE 2-4 GM/100ML-% IV SOLN
2.0000 g | Freq: Four times a day (QID) | INTRAVENOUS | Status: AC
  Administered 2023-05-16 – 2023-05-17 (×3): 2 g via INTRAVENOUS

## 2023-05-16 MED ORDER — ZOLPIDEM TARTRATE 5 MG PO TABS: 5.0000 mg | ORAL_TABLET | Freq: Every evening | ORAL | Status: AC | PRN

## 2023-05-16 MED ORDER — AZELASTINE HCL 0.1 % NA SOLN
1.0000 | Freq: Every day | NASAL | Status: DC
  Filled 2023-05-16: qty 30

## 2023-05-16 MED ORDER — SODIUM CHLORIDE 0.9 % BOLUS PEDS
250.0000 mL | Freq: Once | INTRAVENOUS | Status: DC
Start: 2023-05-16 — End: 2023-05-16

## 2023-05-16 MED ORDER — KETOROLAC TROMETHAMINE 15 MG/ML IJ SOLN
15.0000 mg | Freq: Once | INTRAMUSCULAR | Status: AC
  Administered 2023-05-16: 15 mg via INTRAVENOUS

## 2023-05-16 MED ORDER — CHLORHEXIDINE GLUCONATE 0.12 % MT SOLN
15.0000 mL | Freq: Once | OROMUCOSAL | Status: AC
Start: 2023-05-16 — End: 2023-05-16
  Administered 2023-05-16: 15 mL via OROMUCOSAL

## 2023-05-16 MED ORDER — MONTELUKAST SODIUM 10 MG PO TABS: 10.0000 mg | ORAL_TABLET | Freq: Every day | ORAL | Status: AC

## 2023-05-16 MED ORDER — PREDNISOLONE ACETATE 1 % OP SUSP: 1.0000 [drp] | Freq: Every day | OPHTHALMIC | Status: DC

## 2023-05-16 MED ORDER — BUPIVACAINE-EPINEPHRINE (PF) 0.5% -1:200000 IJ SOLN
INTRAMUSCULAR | Status: DC | PRN
  Administered 2023-05-16: 30 mL

## 2023-05-16 MED ORDER — LACTATED RINGERS IV SOLN: INTRAVENOUS | Status: DC

## 2023-05-16 MED ORDER — SODIUM CHLORIDE 0.9 % IR SOLN
Status: DC | PRN
  Administered 2023-05-16: 3000 mL

## 2023-05-16 MED ORDER — GUAIFENESIN 200 MG PO TABS
400.0000 mg | ORAL_TABLET | Freq: Every day | ORAL | Status: DC
  Administered 2023-05-17: 400 mg via ORAL
  Filled 2023-05-16: qty 2

## 2023-05-16 MED ORDER — QC CALCIUM/MINERALS/VITAMIN D 600-400 MG-UNIT PO TABS: 1.0000 | ORAL_TABLET | Freq: Two times a day (BID) | ORAL | Status: DC

## 2023-05-16 MED ORDER — SIMVASTATIN 40 MG PO TABS
40.0000 mg | ORAL_TABLET | Freq: Every day | ORAL | Status: DC
  Administered 2023-05-16: 40 mg via ORAL
  Filled 2023-05-16: qty 1

## 2023-05-16 MED ORDER — CHLORHEXIDINE GLUCONATE 0.12 % MT SOLN
OROMUCOSAL | Status: AC
  Filled 2023-05-16: qty 15

## 2023-05-16 SURGICAL SUPPLY — 65 items
APL PRP STRL LF DISP 70% ISPRP (MISCELLANEOUS) ×2
BLADE SAW 90X13X1.19 OSCILLAT (BLADE) ×1 IMPLANT
BLADE SAW SAG 25X90X1.19 (BLADE) ×1 IMPLANT
BLADE SURG SZ20 CARB STEEL (BLADE) ×1 IMPLANT
BNDG CMPR STD VLCR NS LF 5.8X6 (GAUZE/BANDAGES/DRESSINGS) ×1
BNDG ELASTIC 6X5.8 VLCR NS LF (GAUZE/BANDAGES/DRESSINGS) ×1 IMPLANT
CEMENT BONE R 1X40 (Cement) ×2 IMPLANT
CEMENT VACUUM MIXING SYSTEM (MISCELLANEOUS) ×1 IMPLANT
CHLORAPREP W/TINT 26 (MISCELLANEOUS) ×1 IMPLANT
COMP TIB PS KNEE E 0D LT (Joint) ×1 IMPLANT
COMPONET TIB PS KNEE E 0D LT (Joint) IMPLANT
COOLER POLAR GLACIER W/PUMP (MISCELLANEOUS) ×1 IMPLANT
COVER MAYO STAND STRL (DRAPES) ×1 IMPLANT
CUFF TOURN SGL QUICK 24 (TOURNIQUET CUFF) ×1
CUFF TOURN SGL QUICK 34 (TOURNIQUET CUFF)
CUFF TRNQT CYL 24X4X16.5-23 (TOURNIQUET CUFF) IMPLANT
CUFF TRNQT CYL 34X4.125X (TOURNIQUET CUFF) IMPLANT
DRAPE IMP U-DRAPE 54X76 (DRAPES) ×1 IMPLANT
DRAPE SHEET LG 3/4 BI-LAMINATE (DRAPES) ×1 IMPLANT
DRAPE U-SHAPE 47X51 STRL (DRAPES) ×1 IMPLANT
DRSG MEPILEX SACRM 8.7X9.8 (GAUZE/BANDAGES/DRESSINGS) IMPLANT
DRSG OPSITE POSTOP 4X10 (GAUZE/BANDAGES/DRESSINGS) ×1 IMPLANT
DRSG OPSITE POSTOP 4X8 (GAUZE/BANDAGES/DRESSINGS) ×1 IMPLANT
ELECT CAUTERY BLADE 6.4 (BLADE) ×1 IMPLANT
ELECT REM PT RETURN 9FT ADLT (ELECTROSURGICAL) ×1
ELECTRODE REM PT RTRN 9FT ADLT (ELECTROSURGICAL) ×1 IMPLANT
FEMUR CMT CR STD SZ 6 LT KNEE (Joint) ×1 IMPLANT
FEMUR CMTD CR STD SZ 6 LT KNEE (Joint) IMPLANT
GAUZE XEROFORM 1X8 LF (GAUZE/BANDAGES/DRESSINGS) ×1 IMPLANT
GLOVE BIO SURGEON STRL SZ7.5 (GLOVE) ×4 IMPLANT
GLOVE BIO SURGEON STRL SZ8 (GLOVE) ×4 IMPLANT
GLOVE BIOGEL PI IND STRL 8 (GLOVE) ×1 IMPLANT
GLOVE INDICATOR 8.0 STRL GRN (GLOVE) ×1 IMPLANT
GOWN STRL REUS W/ TWL LRG LVL3 (GOWN DISPOSABLE) ×1 IMPLANT
GOWN STRL REUS W/ TWL XL LVL3 (GOWN DISPOSABLE) ×1 IMPLANT
GOWN STRL REUS W/TWL LRG LVL3 (GOWN DISPOSABLE) ×1
GOWN STRL REUS W/TWL XL LVL3 (GOWN DISPOSABLE) ×1
HANDLE YANKAUER SUCT OPEN TIP (MISCELLANEOUS) ×1 IMPLANT
HOOD PEEL AWAY T7 (MISCELLANEOUS) ×3 IMPLANT
IV NS IRRIG 3000ML ARTHROMATIC (IV SOLUTION) ×1 IMPLANT
KIT TURNOVER KIT A (KITS) ×1 IMPLANT
MANIFOLD NEPTUNE II (INSTRUMENTS) ×1 IMPLANT
NDL SPNL 20GX3.5 QUINCKE YW (NEEDLE) ×1 IMPLANT
NEEDLE SPNL 20GX3.5 QUINCKE YW (NEEDLE) ×1 IMPLANT
NS IRRIG 1000ML POUR BTL (IV SOLUTION) ×1 IMPLANT
PACK TOTAL KNEE (MISCELLANEOUS) ×1 IMPLANT
PAD WRAPON POLAR KNEE (MISCELLANEOUS) ×1 IMPLANT
PENCIL SMOKE EVACUATOR (MISCELLANEOUS) ×1 IMPLANT
PIN DRILL HDLS TROCAR 75 4PK (PIN) IMPLANT
PULSAVAC PLUS IRRIG FAN TIP (DISPOSABLE) ×1
SCREW FEMALE HEX FIX 25X2.5 (ORTHOPEDIC DISPOSABLE SUPPLIES) IMPLANT
STAPLER SKIN PROX 35W (STAPLE) ×1 IMPLANT
STEM POLY PAT PLY 35M KNEE (Knees) IMPLANT
STEM TIBIAL SZ6-7 E-F10 (Stem) IMPLANT
SUCTION TUBE FRAZIER 10FR DISP (SUCTIONS) ×1 IMPLANT
SUT VIC AB 0 CT1 36 (SUTURE) ×3 IMPLANT
SUT VIC AB 2-0 CT1 27 (SUTURE) ×3
SUT VIC AB 2-0 CT1 TAPERPNT 27 (SUTURE) ×3 IMPLANT
SYR 10ML LL (SYRINGE) ×1 IMPLANT
SYR 20ML LL LF (SYRINGE) ×1 IMPLANT
SYR 30ML LL (SYRINGE) IMPLANT
TIP FAN IRRIG PULSAVAC PLUS (DISPOSABLE) ×1 IMPLANT
TRAP FLUID SMOKE EVACUATOR (MISCELLANEOUS) ×2 IMPLANT
WATER STERILE IRR 500ML POUR (IV SOLUTION) ×1 IMPLANT
WRAPON POLAR PAD KNEE (MISCELLANEOUS) ×1

## 2023-05-16 NOTE — Op Note (Signed)
05/16/2023  12:56 PM  Patient:   Patricia Oliver  Pre-Op Diagnosis:   Degenerative joint disease, left knee.  Post-Op Diagnosis:   Same  Procedure:   Left TKA using all-cemented Zimmer Persona system with a #6 PCR femur, a(n) E-sized  tibial tray with a 10 mm medial congruent E-poly insert, and a 9 x 35 mm all-poly 3-pegged domed patella.  Surgeon:   Maryagnes Amos, MD  Assistant:   Griffin Basil, RNFA  Anesthesia:   GET  Findings:   As above  Complications:   None  EBL:   10 cc  Fluids:   300 cc crystalloid  UOP:   None  TT:   80 minutes at 300 mmHg  Drains:   None  Closure:   Staples  Implants:   As above  Brief Clinical Note:   The patient is a 72 year old female with a long history of progressively worsening left knee pain. The patient's symptoms have progressed despite medications, activity modification, injections, etc. The patient's history and examination were consistent with advanced degenerative joint disease of the left knee confirmed by plain radiographs. The patient presents at this time for a left total knee arthroplasty.  Procedure:   The patient was brought into the operating room. After adequate spinal anesthesia was obtained, the patient was repositioned in the supine position on the operating room table. The left lower extremity was prepped with ChloraPrep solution and draped sterilely. Preoperative antibiotics were administered. A timeout was performed to verify the appropriate surgical site before the limb was exsanguinated with an Esmarch and the tourniquet inflated to 300 mmHg.   A standard anterior approach to the knee was made through an approximately 6-7 inch incision. The incision was carried down through the subcutaneous tissues to expose superficial retinaculum. This was split the length of the incision and the medial flap elevated sufficiently to expose the medial retinaculum. The medial retinaculum was incised, leaving a 3-4 mm cuff of tissue on  the patella. This was extended distally along the medial border of the patellar tendon and proximally through the medial third of the quadriceps tendon. A subtotal fat pad excision was performed before the soft tissues were elevated off the anteromedial and anterolateral aspects of the proximal tibia to the level of the collateral ligaments. The anterior portions of the medial and lateral menisci were removed, as was the anterior cruciate ligament. With the knee flexed to 90, the external tibial guide was positioned and the appropriate proximal tibial cut made. This piece was taken to the back table where it was measured and found to be optimally replicated by a(n) E-sized component.  Attention was directed to the distal femur. The intramedullary canal was accessed through a 3/8" drill hole. The intramedullary guide was inserted and positioned in order to obtain a neutral flexion gap. The distal cutting block was placed at 5 of valgus alignment. Using the +0 slot, the distal cut was made. The distal femur was measured and found to be optimally replicated by the #6 component. The #6 4-in-1 cutting block was positioned and first the posterior, then the posterior chamfer, the anterior chamfer, and finally the anterior cuts were made after verifying that the anterior cortex would not be notched.   At this point, the posterior portions medial and lateral menisci were removed. A trial reduction was performed using the appropriate femoral and tibial components with the 10 mm insert. This demonstrated excellent stability to varus and valgus stressing both in flexion and extension  while permitting full extension. Patellar tracking was assessed and found to be excellent. Therefore, the tibial trial position was marked on the proximal tibia. The patella thickness was measured and found to be 19 mm. Therefore, the appropriate cut was made. The patellar surface was measured and found to be optimally replicated by the 35 mm  component. The three peg holes were drilled in place before the trial button was inserted. Patella tracking was assessed and found to be excellent, passing the "no thumb test". The lug holes were drilled into the distal femur before the trial component was removed.  The tibial tray was repositioned before the keel was created using the appropriate tower, reamer, and punch.  The bony surfaces were prepared for cementing by irrigating them thoroughly with sterile saline solution via the jet lavage system. A bone plug was fashioned from some of the bone that had been removed previously and used to plug the distal femoral canal. In addition, a "cocktail" of 20 cc of Exparel, 30 cc of 0.5% Sensorcaine, 2 cc of Kenalog 40 (80 mg), and 30 mg of Toradol diluted out to 90 cc with normal saline was injected into the postero-medial and postero-lateral aspects of the knee, the medial and lateral gutter regions, and the peri-incisional tissues to help with postoperative analgesia. Meanwhile, the cement was being mixed on the back table.   When the cement was ready, the tibial tray was cemented in first. The excess cement was removed using Personal assistant. Next, the femoral component was impacted into place. Again, the excess cement was removed using Personal assistant. The 10 mm trial insert was positioned and the knee brought into extension while the cement hardened. Finally, the patella was cemented into place and secured using the patellar clamp. Again, the excess cement was removed using Personal assistant. Once the cement had hardened, the knee was placed through a range of motion with the findings as described above. Therefore, the trial insert was removed and, after verifying that no cement had been retained posteriorly, the permanent 10 mm medial congruent E-polyethylene insert was snapped into place with care taken to ensure appropriate locking of the insert. Again the knee was placed through a range of motion with the  findings as described above.  The wound was copiously irrigated with sterile saline solution using the jet lavage system before the quadriceps tendon and retinacular layer were reapproximated using #0 Vicryl interrupted sutures. The superficial retinacular layer also was closed using a running #0 Vicryl suture. The subcutaneous tissues were closed in several layers using 2-0 Vicryl interrupted sutures. The skin was closed using staples. A sterile honeycomb dressing was applied to the skin before the leg was wrapped with an Ace wrap to accommodate the Polar Care device. The patient was then awakened and returned to the recovery room in satisfactory condition after tolerating the procedure well.

## 2023-05-16 NOTE — Discharge Instructions (Addendum)
Orthopedic discharge instructions: May shower with intact OpSite dressing. Apply ice frequently to knee or use Polar Care. Start Eliquis 1 tablet (2.5 mg) twice daily on Friday, 05/16/2022, for 2 weeks, then take aspirin 325 mg twice daily for 4 weeks. Take pain medication as prescribed when needed.  May supplement with ES Tylenol if necessary. May weight-bear as tolerated on right leg - use walker for balance and support. Follow-up in 10-14 days or as scheduled.  AMBULATORY SURGERY  DISCHARGE INSTRUCTIONS   The drugs that you were given will stay in your system until tomorrow so for the next 24 hours you should not:  Drive an automobile Make any legal decisions Drink any alcoholic beverage   You may resume regular meals tomorrow.  Today it is better to start with liquids and gradually work up to solid foods.  You may eat anything you prefer, but it is better to start with liquids, then soup and crackers, and gradually work up to solid foods.   Please notify your doctor immediately if you have any unusual bleeding, trouble breathing, redness and pain at the surgery site, drainage, fever, or pain not relieved by medication.    Additional Instructions: PLEASE LEAVE EXPAREL (TEAL) ARMBAND ON FOR 4 DAYS   Please contact your physician with any problems or Same Day Surgery at [phone], Monday through Friday 6 am to 4 pm, or [facility] number at [phone].  POLAR CARE INFORMATION  MassAdvertisement.it  How to use Breg Polar Care [facility] Therapy System?  YouTube   [email or web address].co.[email or web address]  OPERATING INSTRUCTIONS  Start the product With dry hands, connect the transformer to the electrical connection located on the top of the cooler. Next, plug the transformer into an appropriate electrical outlet. The unit will automatically start running at this point.  To stop the pump, disconnect electrical power.  Unplug to stop the product when not  in use. Unplugging the Polar Care unit turns it off. Always unplug immediately after use. Never leave it plugged in while unattended. Remove pad.    FIRST ADD WATER TO FILL LINE, THEN ICE---Replace ice when existing ice is almost melted  1 Discuss Treatment with your Licensed Health Care Practitioner and Use Only as Prescribed 2 Apply Insulation Barrier & Cold Therapy Pad 3 Check for Moisture 4 Inspect Skin Regularly  Tips and Trouble Shooting Usage Tips 1. Use cubed or chunked ice for optimal performance. 2. It is recommended to drain the Pad between uses. To drain the pad, hold the Pad upright with the hose pointed toward the ground. Depress the black plunger and allow water to drain out. 3. You may disconnect the Pad from the unit without removing the pad from the affected area by depressing the silver tabs on the hose coupling and gently pulling the hoses apart. The Pad and unit will seal itself and will not leak. Note: Some dripping during release is normal. 4. DO NOT RUN PUMP WITHOUT WATER! The pump in this unit is designed to run with water. Running the unit without water will cause permanent damage to the pump. 5. Unplug unit before removing lid.  TROUBLESHOOTING GUIDE Pump not running, Water not flowing to the pad, Pad is not getting cold 1. Make sure the transformer is plugged into the wall outlet. 2. Confirm that the ice and water are filled to the indicated levels. 3. Make sure there are no kinks in the pad. 4. Gently pull on the blue tube to make sure the tube/pad  junction is straight. 5. Remove the pad from the treatment site and ll it while the pad is lying at; then reapply. 6. Confirm that the pad couplings are securely attached to the unit. Listen for the double clicks (Figure 1) to confirm the pad couplings are securely attached.  Leaks    Note: Some condensation on the lines, controller, and pads is unavoidable, especially in warmer climates. 1. If using a Breg Polar Care  Cold Therapy unit with a detachable Cold Therapy Pad, and a leak exists (other than condensation on the lines) disconnect the pad couplings. Make sure the silver tabs on the couplings are depressed before reconnecting the pad to the pump hose; then confirm both sides of the coupling are properly clicked in. 2. If the coupling continues to leak or a leak is detected in the pad itself, stop using it and call Breg Customer Care at [phone].  Cleaning After use, empty and dry the unit with a soft cloth. Warm water and mild detergent may be used occasionally to clean the pump and tubes.  WARNING: The Polar Care Cube can be cold enough to cause serious injury, including full skin necrosis. Follow these Operating Instructions, and carefully read the Product Insert (see pouch on side of unit) and the Cold Therapy Pad Fitting Instructions (provided with each Cold Therapy Pad) prior to use.

## 2023-05-16 NOTE — Transfer of Care (Signed)
Immediate Anesthesia Transfer of Care Note  Patient: Patricia Oliver  Procedure(s) Performed: TOTAL KNEE ARTHROPLASTY (Left: Knee)  Patient Location: PACU  Anesthesia Type:Spinal  Level of Consciousness: awake, drowsy, and patient cooperative  Airway & Oxygen Therapy: Patient Spontanous Breathing  Post-op Assessment: Report given to RN and Post -op Vital signs reviewed and stable  Post vital signs: Reviewed and stable  Last Vitals:  Vitals Value Taken Time  BP 118/79 05/16/23 1301  Temp    Pulse 72 05/16/23 1304  Resp 19 05/16/23 1304  SpO2 98 % 05/16/23 1304  Vitals shown include unfiled device data.  Last Pain:  Vitals:   05/16/23 0902  TempSrc: Temporal  PainSc: 4          Complications: No notable events documented.

## 2023-05-16 NOTE — Anesthesia Preprocedure Evaluation (Signed)
Anesthesia Evaluation  Patient identified by MRN, date of birth, ID band Patient awake    Reviewed: Allergy & Precautions, H&P , NPO status , Patient's Chart, lab work & pertinent test results, reviewed documented beta blocker date and time   Airway Mallampati: II  TM Distance: >3 FB Neck ROM: full    Dental  (+) Teeth Intact   Pulmonary neg pulmonary ROS   Pulmonary exam normal        Cardiovascular Exercise Tolerance: Good negative cardio ROS Normal cardiovascular exam Rate:Normal     Neuro/Psych negative neurological ROS  negative psych ROS   GI/Hepatic Neg liver ROS,GERD  Medicated,,  Endo/Other  negative endocrine ROS    Renal/GU      Musculoskeletal   Abdominal   Peds  Hematology negative hematology ROS (+)   Anesthesia Other Findings   Reproductive/Obstetrics negative OB ROS                             Anesthesia Physical Anesthesia Plan  ASA: 2  Anesthesia Plan: General and Spinal   Post-op Pain Management:    Induction: Intravenous  PONV Risk Score and Plan: 3 and Propofol infusion, TIVA, Midazolam and Ondansetron  Airway Management Planned: Natural Airway and Nasal Cannula  Additional Equipment:   Intra-op Plan:   Post-operative Plan:   Informed Consent: I have reviewed the patients History and Physical, chart, labs and discussed the procedure including the risks, benefits and alternatives for the proposed anesthesia with the patient or authorized representative who has indicated his/her understanding and acceptance.     Dental Advisory Given  Plan Discussed with: Anesthesiologist, CRNA and Surgeon  Anesthesia Plan Comments: (Patient consented for risks of anesthesia including but not limited to:  - adverse reactions to medications - damage to eyes, teeth, lips or other oral mucosa - nerve damage due to positioning  - sore throat or hoarseness - Damage to  heart, brain, nerves, lungs, other parts of body or loss of life  Patient voiced understanding and assent.)       Anesthesia Quick Evaluation

## 2023-05-16 NOTE — H&P (Signed)
History of Present Illness:  Patricia Oliver is a 72 y.o. female who has severe knee pain. She has been previously evaluated by Dedra Skeens and Dr. Joice Lofts for this knee discomfort. She states that the pain has been causing her issues for the last 10 years. She localizes the pain to the anterior and medial aspect. She describes it as aching and dull with occasional stabbing sensations. The pain is made worse with prolonged standing, long walks and use of stairs or exercising. She states that she has not been able to go on walks as much as she would like to. She denies any true locking of the knee, but does admit to having some catching and feelings of giving way. She has failed conservative treatment including corticosteroid injections, viscosupplementation injections, at home exercise interventions, NSAID's, and activity modification. She has requested operative intervention for relief of her DJD symptoms. She denies having any known cardiac or pulmonary issues. Denies any history of DVTs or clots. Has no known diabetic issues. She states that she is ready to have this replacement performed so she can get back to doing what she enjoys daily.  Social Hx: Patient is married. Lives at home. Denies any smoking, illicit drug use or alcohol use.  Allergies: Ciprofloxacin Swelling  Morphine Vomiting, Nausea And Vomiting  Sulfa (Sulfonamide Antibiotics) Rash, Itching and Swelling   Current Outpatient Medications: ascorbic acid, vitamin C, (VITAMIN C) 500 MG tablet Take 500 mg by mouth once daily.  azelastine (ASTELIN) 137 mcg nasal spray Place 1 spray into both nostrils once daily 90 mL 3  azelastine (OPTIVAR) 0.05 % ophthalmic solution Place 1 drop into both eyes once daily 6 mL 5  calcium citrate-vitamin D3 (CITRACAL+D) 315 mg-6.25 mcg (250 unit) tablet Take 1 tablet by mouth 2 (two) times daily with meals.  celecoxib (CELEBREX) 200 MG capsule Take 1 capsule (200 mg total) by mouth 2 (two) times daily 180 capsule  1  cyanocobalamin (VITAMIN B12) 1000 MCG tablet Take 1,000 mcg by mouth once daily  dicyclomine (BENTYL) 10 mg capsule Take 1 capsule (10 mg total) by mouth 4 (four) times daily before meals and nightly 360 capsule 1  estradioL (ESTRACE) 0.01 % (0.1 mg/gram) vaginal cream Place vaginally twice a week  famotidine (PEPCID) 40 MG tablet Take 1 tablet (40 mg total) by mouth 2 (two) times daily as needed for Heartburn 180 tablet 1  fluticasone propionate (FLONASE) 50 mcg/actuation nasal spray Place 2 sprays into both nostrils once daily 48 g 1  glucosamine su 2KCl-chondroit 500-400 mg Tab Take 1 tablet by mouth once daily  guaiFENesin (MUCINEX) 600 mg SR tablet Take 600 mg by mouth every 12 (twelve) hours as needed for Cough.  inulin (FIBER GUMMIES) 2 gram Chew Take by mouth once daily  montelukast (SINGULAIR) 10 mg tablet Take 1 tablet (10 mg total) by mouth once daily 90 tablet 3  ondansetron (ZOFRAN) 4 MG tablet  predniSONE (DELTASONE) 10 MG tablet Take 1 tablet (10 mg total) by mouth as directed 6 day taper, 6, 5, 4, 3, 2, 1 21 tablet 0  simvastatin (ZOCOR) 40 MG tablet Take 1 tablet (40 mg total) by mouth once daily 90 tablet 3  tiZANidine (ZANAFLEX) 2 MG tablet Take 1 tablet (2 mg total) by mouth nightly as needed 30 tablet 1  traMADoL (ULTRAM) 50 mg tablet Take 1-2 tablets (50-100 mg total) by mouth every 6 (six) hours as needed for Pain 30 tablet 0  vitamin E 400 UNIT capsule Take 400  Units by mouth once daily.   Past Medical History:  Acid reflux disease  Arthritis  Cataract cortical, senile  left eye  Cataract, left eye  Chickenpox  Chickenpox  Diverticulosis 12/10/2014  Fibrocystic breast disease  With chronic right breast nodule.  History of palpitations  History of palpitations  Hyperlipidemia  Hyperplastic colon polyp 12/10/2014  Internal hemorrhoids 12/10/2014  Osteopenia  Perennial allergic rhinitis  Stress fracture of foot  Right  Vaginal dryness   Past Surgical  History:  APPENDECTOMY 07/23/1954  HYSTERECTOMY 07/24/1999  TAH/BSO  FRACTURE SURGERY Right 07/23/2004  ORIF right foot fracture.  COLONOSCOPY 09/01/2004 (repeat 10 years)  COLONOSCOPY 12/10/2014 (Hyperplastic colon polyp/Repeat 65yrs/MUS)  Excision of soft tissue mass with repair of extensor tendon, right index PIP joint region Right 08/09/2021 (Dr.Lorann Tani)  Excision of recurrent soft tissue mass ulnar aspect right index finger 03/19/2023 (Lenola Lockner, Excell Seltzer, MD)   Physical Exam:  Ht:160 cm (5\' 3" ) Wt:65.3 kg (144 lb) BMI: Body mass index is 25.51 kg/m.  General/Constitutional: No apparent distress: well-nourished and well developed. Eyes: Pupils equal, round with synchronous movement. Lymphatic: No palpable adenopathy. Respiratory: Patient has good chest rise and fall with inspiration and expiration. All lung fields are clear to auscultation bilaterally. There is no Rales, rhonchi or wheezes appreciated. Cardiovascular: Upon auscultation there is a regular rate and rhythm without any murmurs, rubs, gallops or heaves appreciated. There does not appear to be any swelling down the lower extremities. Posterior tibial pulses appreciated bilaterally, 2+. Integumentary: No impressive skin lesions present, except as noted in detailed exam. Neuro/Psych: Normal mood and affect, oriented to person, place and time. Musculoskeletal: see exam below  Left knee exam: Upon inspection of the patient's left knee there does not appear to be any skin changes, open abrasions, swelling or redness. There is a mild varus alignment. Upon palpation, the patient reports having pain along the medial and lateral joint line of their knee. Patient has about 10 degrees off of full extension, and able to flex back to 112 with mild pain. Varus and valgus stress testing shows mildly positive pseudolaxity to varus stress testing. The patella tracks well within the femoral groove from flexion into extension. Anterior and posterior  drawer testing negative. Patient is neurovascularly intact down their lower extremity to all dermatomes. Posterior tibial pulses appreciated 2+.  Imaging:  Recent AP and lateral weightbearing views as well as merchant views of the left knee were reviewed. These films demonstrate severe degenerative changes primarily involving the medial compartment. There is about 80 to 90% joint space closure with subchondral changes and mild osteophyte formation. Overall alignment is relative varus. No fractures, lytic lesions or gross deformities appreciated on films.  Impression: Degenerative joint disease left knee.  Plan: The treatment options have been discussed with the patient.  She is quite frustrated with her symptoms and functional limitations, and is ready to consider more aggressive treatment options.  Therefore, a surgical procedure has been suggested, specifically a left total knee arthroplasty.  The procedure has been discussed in detail with the patient, as have the potential risks (including bleeding, infection, nerve and/or blood vessel injury, persistent or recurrent pain, stiffness, loosening of and/or failure of the components, need for further surgery, blood clots, strokes, heart attacks and/or arrhythmias, etc.) and benefits.  The patient states her understanding and wishes to proceed.  A formal written consent will be obtained by the nursing staff.  She will follow-up per routine postoperatively.  H&P reviewed and patient re-examined. No changes.

## 2023-05-16 NOTE — Progress Notes (Signed)
PT Cancellation Note  Patient Details Name: Patricia Oliver MRN: [id number] DOB: [birth year]   Cancelled Treatment:    Reason Eval/Treat Not Completed: Patient not medically ready Orders received, chart reviewed.  Per conversation with pt's RN at ~16:15 pt "cannot wiggle her toes, nor dorsiflex/plantar flex. And is not able to feel anything below her thigh level."  Will hold PT this date and attempt PT eval POD1.   Malachi Pro, DPT 05/16/2023, 4:16 PM

## 2023-05-16 NOTE — Anesthesia Procedure Notes (Signed)
Spinal  Patient location during procedure: OR Start time: 05/16/2023 10:37 AM End time: 05/16/2023 10:43 AM Reason for block: surgical anesthesia Staffing Performed: resident/CRNA  Anesthesiologist: Stephanie Coup, MD Resident/CRNA: Henrietta Hoover, CRNA Performed by: Henrietta Hoover, CRNA Authorized by: Stephanie Coup, MD   Preanesthetic Checklist Completed: patient identified, IV checked, site marked, risks and benefits discussed, surgical consent, monitors and equipment checked, pre-op evaluation and timeout performed Spinal Block Patient position: sitting Prep: DuraPrep Patient monitoring: heart rate, cardiac monitor, continuous pulse ox and blood pressure Approach: midline Location: L3-4 Injection technique: single-shot Needle Needle type: Sprotte  Needle gauge: 24 G Needle length: 9 cm Assessment Sensory level: T4 Events: CSF return

## 2023-05-17 ENCOUNTER — Other Ambulatory Visit: Payer: Self-pay

## 2023-05-17 ENCOUNTER — Encounter: Payer: Self-pay | Admitting: Surgery

## 2023-05-17 DIAGNOSIS — M1712 Unilateral primary osteoarthritis, left knee: Secondary | ICD-10-CM | POA: Diagnosis not present

## 2023-05-17 MED ORDER — KETOROLAC TROMETHAMINE 15 MG/ML IJ SOLN
INTRAMUSCULAR | Status: AC
Start: 2023-05-17 — End: ?
  Filled 2023-05-17: qty 1

## 2023-05-17 MED ORDER — OXYCODONE HCL 5 MG PO TABS
ORAL_TABLET | ORAL | Status: AC
  Filled 2023-05-17: qty 1

## 2023-05-17 MED ORDER — DOCUSATE SODIUM 100 MG PO CAPS
ORAL_CAPSULE | ORAL | Status: AC
  Filled 2023-05-17: qty 1

## 2023-05-17 MED ORDER — APIXABAN 2.5 MG PO TABS
ORAL_TABLET | ORAL | Status: AC
  Filled 2023-05-17: qty 1

## 2023-05-17 MED ORDER — ACETAMINOPHEN 500 MG PO TABS
ORAL_TABLET | ORAL | Status: AC
  Filled 2023-05-17: qty 2

## 2023-05-17 MED ORDER — APIXABAN 2.5 MG PO TABS
2.5000 mg | ORAL_TABLET | Freq: Two times a day (BID) | ORAL | 0 refills | Status: AC
  Filled 2023-05-17: qty 30, 15d supply, fill #0

## 2023-05-17 MED ORDER — FAMOTIDINE 20 MG PO TABS
ORAL_TABLET | ORAL | Status: AC
  Filled 2023-05-17: qty 2

## 2023-05-17 MED ORDER — OXYCODONE HCL 5 MG PO TABS
5.0000 mg | ORAL_TABLET | ORAL | 0 refills | Status: AC | PRN
Start: 2023-05-16 — End: ?
  Filled 2023-05-17: qty 40, 4d supply, fill #0

## 2023-05-17 MED ORDER — CEFAZOLIN SODIUM-DEXTROSE 2-4 GM/100ML-% IV SOLN
INTRAVENOUS | Status: AC
  Filled 2023-05-17: qty 100

## 2023-05-17 NOTE — Discharge Summary (Signed)
Physician Discharge Summary  Subjective: 1 Day Post-Op Procedure(s) (LRB): TOTAL KNEE ARTHROPLASTY (Left) Patient reports pain as mild.   Patient seen in rounds with Dr. Joice Lofts. Patient is well, and has had no acute complaints or problems.  Denies any CP, SOB, N/B, fevers or chills Patient has passed all of her PT protocols, will transition to home health physical therapy. Patient is ready to go home  Physician Discharge Summary  Patient ID: EISLEY JACOBI MRN: [id number] DOB/AGE: 04/30/1951 72 y.o.  Admit date: 05/16/2023 Discharge date: 05/17/2023  Admission Diagnoses:  Discharge Diagnoses:  Principal Problem:   Status post total knee replacement using cement, left   Discharged Condition: good  Hospital Course: Patient presented to the hospital on 05/16/2023 to have an elective left total knee arthroplasty performed by Dr. Joice Lofts.  The patient tolerated the surgery well without any complications.  See operative details below.  Postoperatively, the patient did very well.  She was able to pass her PT protocols on postop day 1.  She was able to void her bowels and bladder same day.  She denies having any CP, SOB, N/B, fevers or chills.  Her vital signs are stable.  Bandages are well intact and in place.  Patient is stable for discharge home.  Procedure:   Left TKA using all-cemented Zimmer Persona system with a #6 PCR femur, a(n) E-sized  tibial tray with a 10 mm medial congruent E-poly insert, and a 9 x 35 mm all-poly 3-pegged domed patella.   Surgeon:   Maryagnes Amos, MD   Assistant:   Griffin Basil, RNFA   Anesthesia:   GET   Findings:   As above   Complications:   None   EBL:   10 cc   Fluids:   300 cc crystalloid   UOP:   None   TT:   80 minutes at 300 mmHg   Drains:   None   Closure:   Staples   Implants:   As above  Treatments: None  Discharge Exam: Blood pressure 131/86, pulse 80, temperature 98.3 F (36.8 C), temperature source Temporal, resp. rate 16,  height 5\' 3"  (1.6 m), weight 64 kg, SpO2 97%.   Disposition: Home   Allergies as of 05/17/2023       Reactions   Morphine Nausea And Vomiting, Other (See Comments)   Ciprofloxacin Swelling, Rash   Elemental Sulfur Itching, Swelling, Rash   Sulfa Antibiotics Rash   Sulfur Itching, Rash, Swelling        Medication List     STOP taking these medications    celecoxib 200 MG capsule Commonly known as: CELEBREX   traMADol 50 MG tablet Commonly known as: ULTRAM       TAKE these medications    apixaban 2.5 MG Tabs tablet Commonly known as: Eliquis Take 1 tablet (2.5 mg total) by mouth 2 (two) times daily.   ascorbic acid 500 MG tablet Commonly known as: VITAMIN C Take 500 mg by mouth daily.   azelastine 0.05 % ophthalmic solution Commonly known as: OPTIVAR Place 1 drop into the left eye daily.   Azelastine HCl 137 MCG/SPRAY Soln Place 1 spray into both nostrils daily.   dicyclomine 10 MG capsule Commonly known as: BENTYL Take 1 capsule (10 mg total) by mouth 2 (two) times daily.   estradiol 0.1 MG/GM vaginal cream Commonly known as: ESTRACE Place 1 Applicatorful vaginally 2 (two) times a week.   famotidine 40 MG tablet Commonly known as: PEPCID Take 1  tablet (40 mg total) by mouth every morning.   fluticasone 50 MCG/ACT nasal spray Commonly known as: FLONASE Place 2 sprays into both nostrils every morning.   glucosamine-chondroitin 500-400 MG tablet Take 1 tablet by mouth daily.   guaifenesin 400 MG Tabs tablet Commonly known as: HUMIBID E Take 400 mg by mouth every morning.   montelukast 10 MG tablet Commonly known as: SINGULAIR Take 1 tablet (10 mg total) by mouth at bedtime.   oxyCODONE 5 MG immediate release tablet Commonly known as: Roxicodone Take 1-2 tablets (5-10 mg total) by mouth every 4 (four) hours as needed for moderate pain (pain score 4-6) or severe pain (pain score 7-10).   prednisoLONE acetate 1 % ophthalmic suspension Commonly  known as: PRED FORTE Place 1 drop into the left eye daily.   prednisoLONE-Moxifloxacin 1-0.5 % Soln Place 1 drop into the right eye 4 (four) times daily.   QC Calcium/Minerals/Vitamin D 600-400 MG-UNIT Tabs Take 1 tablet by mouth 2 (two) times daily.   simvastatin 40 MG tablet Commonly known as: ZOCOR Take 1 tablet (40 mg total) by mouth once daily What changed:  how much to take how to take this when to take this   VITAFUSION FIBER WELL/PRO PO Take 1 tablet by mouth daily.   Vitamin B-12 5000 MCG Lozg Take 1 tablet by mouth daily.   vitamin E 180 MG (400 UNITS) capsule Take 400 Units by mouth daily.   zolpidem 5 MG tablet Commonly known as: AMBIEN Take 1 tablet (5 mg total) by mouth at bedtime as needed for sleep.               Durable Medical Equipment  (From admission, onward)           Start     Ordered   05/16/23 1520  DME Bedside commode  Once       Question:  Patient needs a bedside commode to treat with the following condition  Answer:  Status post total knee replacement using cement, left   05/16/23 1519   05/16/23 1520  DME 3 n 1  Once        05/16/23 1519   05/16/23 1520  DME Walker rolling  Once       Question Answer Comment  Walker: With 5 Inch Wheels   Patient needs a walker to treat with the following condition Status post total knee replacement using cement, left      05/16/23 1519            Follow-up Information     Anson [place], PA-C Follow up.   Specialty: Physician Assistant Why: Friday 11/8 @ 2:15 pm Contact information: [address] [place] [address] [phone]         Mick Sell, PT Follow up.   Specialty: Physical Therapy Why: Physical therapy Friday 11/8 @ 3:00 pm Contact information: [address] [place] [address] [phone]                 Signed: Gean Birchwood 05/17/2023, 11:17 AM   Objective: Vital signs in last 24 hours: Temp:  [97 F (36.1  C)-98.3 F (36.8 C)] 98.3 F (36.8 C) (10/25 0833) Pulse Rate:  [60-85] 80 (10/25 0833) Resp:  [12-22] 16 (10/25 0833) BP: (101-146)/(45-111) 131/86 (10/25 0833) SpO2:  [96 %-100 %] 97 % (10/25 0833)  Intake/Output from previous day:  Intake/Output Summary (Last 24 hours) at 05/17/2023 1117 Last data filed at 05/17/2023 0611 Gross per  24 hour  Intake 600 ml  Output 1261 ml  Net -661 ml    Intake/Output this shift: No intake/output data recorded.  Labs: No results for input(s): "HGB" in the last 72 hours. No results for input(s): "WBC", "RBC", "HCT", "PLT" in the last 72 hours. No results for input(s): "NA", "K", "CL", "CO2", "BUN", "CREATININE", "GLUCOSE", "CALCIUM" in the last 72 hours. No results for input(s): "LABPT", "INR" in the last 72 hours.  EXAM: General - Patient is Alert, Appropriate, and Oriented Extremity - Neurologically intact ABD soft Neurovascular intact Sensation intact distally Intact pulses distally Dorsiflexion/Plantar flexion intact No cellulitis present Compartment soft Dressing - dressing C/D/I and scant drainage Motor Function - intact, moving foot and toes well on exam. Able to plantar and dorsiflex with good strength and range of motion.  Patient able to straight leg raise with out any assistance from provider.  She is neurovascular intact all dermatomes down her left lower extremity.  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   Weight as of this encounter: 64 kg.  Patient has passed all of her PT protocols and is stable for discharge home.  Look to transition to home health physical therapy to work on strength, ROM and gait using this left lower extremity.   Discussed with the patient continuing to utilize [employer]   Patient will use bone foam in 20-30 minute intervals   Patient will wear TED hose bilaterally to help prevent DVT and clot formation   Discussed the honeycomb bandage.  Can be replaced with other honeycomb bandages that will be sent home with the patient   Discussed sending the patient home with Tylenol and oxycodone for as needed pain management.  Patient will take 2.5 of Eliquis twice daily for DVT prophylaxis   Weight-Bearing as tolerated to left leg   Patient will follow-up with [facility] orthopedics in 2 weeks for staple removal and reevaluation  Diet - Regular diet Follow up - in 2 weeks Activity - WBAT Disposition - Home Condition Upon Discharge - Good DVT Prophylaxis -  Eliquis and TED hose  Danise Edge, PA-C Orthopaedic Surgery 05/17/2023, 11:17 AM

## 2023-05-17 NOTE — Evaluation (Signed)
Physical Therapy Evaluation Patient Details Name: Patricia Oliver MRN: [id number] DOB: 1951/05/31 Today's Date: 05/17/2023  History of Present Illness  Pt is a 72 y/o female admitted 05/16/23 for Left Total Knee Replacement. Op date 10/24 by Dr. Joice Lofts.  Clinical Impression  Pt received in recliner with husband at bedside and agreed to PT session. Pt reports pain is on a scale of 3/10 and primarily feels tightness. Pt presented with improve sensation bilat in comparison to yesterday. Pt demonstrated and verbally expressed understanding with PT TKR HEP packet and OT TKR handout. Pt performed STS from recliner with the use of RW (2wheels) supervision. Pt amb to hallway and completed stair negotiation CGA. Stair negotiation was perform with a step to approach as the pt used the R hand rail. Pt amb back to recliner to complete session and did not report an exacerbation of pain during or after activity. Left knee ext goniometric measurement reads: 3 degrees. Left knee flx goniometric measurement reads: 95 degrees. Pt will continue to benefit from skilled PT sessions following d/c in order to improve strength, ROM, activity tolerance, and functional mobility in order to maximize safe independence.       If plan is discharge home, recommend the following: A little help with walking and/or transfers;A little help with bathing/dressing/bathroom;Assist for transportation;Help with stairs or ramp for entrance   Can travel by private vehicle        Equipment Recommendations Rolling walker (2 wheels)  Recommendations for Other Services       Functional Status Assessment Patient has not had a recent decline in their functional status     Precautions / Restrictions Precautions Precautions: Knee Precaution Booklet Issued: Yes (comment) Precaution Comments: PT HEP and precautions packet and OT handout provided. Restrictions Weight Bearing Restrictions: Yes LLE Weight Bearing: Weight bearing as tolerated       Mobility  Bed Mobility               General bed mobility comments: Pt recieved in recliner. Bed mobility deferred.    Transfers Overall transfer level: Needs assistance Equipment used: Rolling walker (2 wheels) Transfers: Sit to/from Stand Sit to Stand: Supervision           General transfer comment: Pt performed STS from recliner with the use of RW (2wheels) supervision.    Ambulation/Gait Ambulation/Gait assistance: Contact guard assist Gait Distance (Feet): 150 Feet Assistive device: Rolling walker (2 wheels) Gait Pattern/deviations: Step-through pattern, Antalgic Gait velocity: decreased     General Gait Details: Pt amb from room into hallway with the use of RW (2wheels) CGA. Pt reports a sensation of primarily tightness vs pain in Left knee.  Stairs Stairs: Yes Stairs assistance: Contact guard assist Stair Management: One rail Right Number of Stairs: 4 General stair comments: Pt performed stair negotiation CGA without the exacerbation of discomfort during or after activity.  Wheelchair Mobility     Tilt Bed    Modified Rankin (Stroke Patients Only)       Balance Overall balance assessment: Needs assistance Sitting-balance support: No upper extremity supported, Feet supported Sitting balance-Leahy Scale: Good     Standing balance support: Bilateral upper extremity supported Standing balance-Leahy Scale: Good                               Pertinent Vitals/Pain Pain Assessment Pain Assessment: 0-10 Pain Score: 3  Pain Location: Left knee Pain Descriptors / Indicators: Constant, Tightness Pain  Intervention(s): Monitored during session, Ice applied, Premedicated before session    Home Living Family/patient expects to be discharged to:: Private residence Living Arrangements: Spouse/significant other Available Help at Discharge: Family;Available 24 hours/day Type of Home: House Home Access: Stairs to enter Entrance  Stairs-Rails: Right Entrance Stairs-Number of Steps: 3   Home Layout: One level Home Equipment: Agricultural consultant (2 wheels);Cane - single point;BSC/3in1      Prior Function Prior Level of Function : Independent/Modified Independent             Mobility Comments: Pt reports due to prior pain, pt did not participate in long distance walks. However pt has been able to grocery shop on their own. ADLs Comments: Pt reports independence.     Extremity/Trunk Assessment   Upper Extremity Assessment Upper Extremity Assessment: Overall WFL for tasks assessed    Lower Extremity Assessment Lower Extremity Assessment: LLE deficits/detail LLE Deficits / Details: Left TKR       Communication   Communication Communication: No apparent difficulties Cueing Techniques: Verbal cues  Cognition Arousal: Alert Behavior During Therapy: WFL for tasks assessed/performed Overall Cognitive Status: Within Functional Limits for tasks assessed                                 General Comments: AOx4. Pt pleasant and willing to participate in PT session.        General Comments General comments (skin integrity, edema, etc.): Pt reports minimal sensation of numbness within LLE, however pt reports improvement incomparison to yesterday. Pt is reactive to light touch sensation on LLE.    Exercises Total Joint Exercises Ankle Circles/Pumps: AROM, Strengthening, Left, 5 reps, Seated Quad Sets: AROM, Strengthening, Left, 5 reps, Seated Short Arc Quad: AROM, Strengthening, Left, 5 reps, Seated Heel Slides: AROM, Strengthening, Left, 5 reps, Seated Hip ABduction/ADduction: AROM, Strengthening, Left, 5 reps, Seated Straight Leg Raises: AROM, Strengthening, Left, 5 reps, Seated Long Arc Quad: AROM, Left, Strengthening, 5 reps, Seated Knee Flexion: AROM, Left, 5 reps, Seated, Strengthening Goniometric ROM: Left Knee Flexion= 95 degrees. Left Knee Extension= 3 degrees.   Assessment/Plan    PT  Assessment Patient does not need any further PT services  PT Problem List Decreased strength;Decreased range of motion;Decreased activity tolerance;Decreased balance;Decreased mobility;Pain       PT Treatment Interventions Gait training;DME instruction;Stair training;Therapeutic activities;Therapeutic exercise;Modalities    PT Goals (Current goals can be found in the Care Plan section)  Acute Rehab PT Goals Patient Stated Goal: To decrease pain and regain full extension. PT Goal Formulation: With patient/family Time For Goal Achievement: 05/31/23 Potential to Achieve Goals: Good    Frequency BID     Co-evaluation               AM-PAC PT "6 Clicks" Mobility  Outcome Measure Help needed turning from your back to your side while in a flat bed without using bedrails?: A Little Help needed moving from lying on your back to sitting on the side of a flat bed without using bedrails?: A Little Help needed moving to and from a bed to a chair (including a wheelchair)?: A Little Help needed standing up from a chair using your arms (e.g., wheelchair or bedside chair)?: A Little Help needed to walk in hospital room?: A Little Help needed climbing 3-5 steps with a railing? : A Little 6 Click Score: 18    End of Session Equipment Utilized During Treatment: Gait belt Activity  Tolerance: Patient tolerated treatment well Patient left: in chair;with nursing/sitter in room;with call bell/phone within reach;with family/visitor present Nurse Communication: Mobility status PT Visit Diagnosis: Other abnormalities of gait and mobility (R26.89);Muscle weakness (generalized) (M62.81);Difficulty in walking, not elsewhere classified (R26.2);Pain Pain - Right/Left: Left Pain - part of body: Knee    Time: 1610-9604 PT Time Calculation (min) (ACUTE ONLY): 28 min   Charges:   PT Evaluation $PT Eval Low Complexity: 1 Low PT Treatments $Gait Training: 8-22 mins PT General Charges $$ ACUTE PT  VISIT: 1 Visit         Niyanna Asch Sauvignon Howard SPT, LAT, ATC   Charnita Trudel Sauvignon-Howard 05/17/2023, 12:35 PM

## 2023-05-17 NOTE — TOC Transition Note (Signed)
Transition of Care [facility]) - CM/SW Discharge Note   Patient Details  Name: Patricia Oliver MRN: [id number] Date of Birth: [birth year]  Transition of Care [facility] CM/SW Contact:  Margarito Liner, LCSW Phone Number: 05/17/2023, 11:22 AM   Clinical Narrative: Patient has orders to discharge home today. She was set up with [facility] prior to admission. Per RN, patient works for [employer]. Patient told RN she has a RW at home and will obtain any other DME from [employer] equipment closet. No further concerns. CSW signing off.  Final next level of care: Home w Home Health Services Barriers to Discharge: No Barriers Identified   Patient Goals and CMS Choice      Discharge Placement                      Patient and family notified of of transfer: 05/17/23  Discharge Plan and Services Additional resources added to the After Visit Summary for                            [facility] Arranged: PT HH Agency: [facility]        Social Determinants of Health (SDOH) Interventions SDOH Screenings   Food Insecurity: No Food Insecurity (05/16/2023)  Housing: Low Risk  (05/16/2023)  Transportation Needs: No Transportation Needs (05/16/2023)  Utilities: Not At Risk (05/16/2023)  Tobacco Use: Low Risk  (05/16/2023)     Readmission Risk Interventions     No data to display

## 2023-05-17 NOTE — Progress Notes (Signed)
Subjective: 1 Day Post-Op Procedure(s) (LRB): TOTAL KNEE ARTHROPLASTY (Left) Patient reports pain as mild.   Patient seen in rounds with Dr. Joice Lofts. Patient is well, and has had no acute complaints or problems.  We will start therapy today.  Plan is to go Home after hospital stay.  Objective: Vital signs in last 24 hours: Temp:  [97 F (36.1 C)-98.3 F (36.8 C)] 98.3 F (36.8 C) (10/25 0833) Pulse Rate:  [60-85] 80 (10/25 0833) Resp:  [12-22] 16 (10/25 0833) BP: (101-146)/(45-111) 131/86 (10/25 0833) SpO2:  [96 %-100 %] 97 % (10/25 0833)  Intake/Output from previous day:  Intake/Output Summary (Last 24 hours) at 05/17/2023 1102 Last data filed at 05/17/2023 0611 Gross per 24 hour  Intake 600 ml  Output 1261 ml  Net -661 ml    Intake/Output this shift: No intake/output data recorded.  Labs: No results for input(s): "HGB" in the last 72 hours. No results for input(s): "WBC", "RBC", "HCT", "PLT" in the last 72 hours. No results for input(s): "NA", "K", "CL", "CO2", "BUN", "CREATININE", "GLUCOSE", "CALCIUM" in the last 72 hours. No results for input(s): "LABPT", "INR" in the last 72 hours.  EXAM General - Patient is Alert, Appropriate, and Oriented Extremity - Neurologically intact ABD soft Neurovascular intact Sensation intact distally Intact pulses distally Dorsiflexion/Plantar flexion intact No cellulitis present Compartment soft Dressing - dressing C/D/I and scant drainage Motor Function - intact, moving foot and toes well on exam. Able to plantar and dorsiflex with good strength and range of motion.  Patient able to straight leg raise with out any assistance from provider.  She is neurovascular intact all dermatomes down her left lower extremity.  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   Weight as of this encounter: 64 kg. Advance diet Up with therapy  Patient will continue to work with physical therapy to pass postoperative PT protocols, ROM and strengthening  Discussed with the patient continuing to utilize Polar Care  Patient will use bone foam in 20-30 minute intervals  Patient will wear TED hose bilaterally to help prevent DVT and clot formation  Discussed the honeycomb bandage.  Can be replaced with other honeycomb bandages that will be sent home with the patient  Discussed sending the patient home with Tylenol and oxycodone for as needed pain management.  Patient will take 2.5 of Eliquis twice daily for DVT prophylaxis  Weight-Bearing as tolerated to left leg  Patient will follow-up with [facility] clinic orthopedics in 2 weeks for staple removal and reevaluation  Rayburn Go, PA-C [facility] 05/17/2023, 11:02 AM

## 2023-05-17 NOTE — Anesthesia Postprocedure Evaluation (Signed)
Anesthesia Post Note  Patient: Patricia Oliver  Procedure(s) Performed: TOTAL KNEE ARTHROPLASTY (Left: Knee)  Patient location during evaluation: PACU Anesthesia Type: Spinal Level of consciousness: awake and alert Pain management: pain level controlled Vital Signs Assessment: post-procedure vital signs reviewed and stable Respiratory status: spontaneous breathing, nonlabored ventilation, respiratory function stable and patient connected to nasal cannula oxygen Cardiovascular status: blood pressure returned to baseline and stable Postop Assessment: no apparent nausea or vomiting Anesthetic complications: no  No notable events documented.   Last Vitals:  Vitals:   05/17/23 0516 05/17/23 0833  BP: 123/80 131/86  Pulse: 70 80  Resp: 14 16  Temp: 36.8 C 36.8 C  SpO2: 96% 97%    Last Pain:  Vitals:   05/17/23 0833  TempSrc: Temporal  PainSc:                  Stephanie Coup

## 2023-05-17 NOTE — Plan of Care (Signed)
progressing 

## 2023-06-03 ENCOUNTER — Other Ambulatory Visit: Payer: Self-pay | Admitting: Family Medicine

## 2023-06-03 DIAGNOSIS — Z1231 Encounter for screening mammogram for malignant neoplasm of breast: Secondary | ICD-10-CM

## 2023-06-04 ENCOUNTER — Ambulatory Visit: Payer: Medicare PPO | Attending: Student

## 2023-06-04 DIAGNOSIS — R262 Difficulty in walking, not elsewhere classified: Secondary | ICD-10-CM | POA: Diagnosis present

## 2023-06-04 DIAGNOSIS — M25662 Stiffness of left knee, not elsewhere classified: Secondary | ICD-10-CM | POA: Diagnosis present

## 2023-06-04 NOTE — Therapy (Signed)
OUTPATIENT PHYSICAL THERAPY EVALUATION   Patient Name: Patricia Oliver MRN: [id number] DOB:[birth year], 72 y.o., female Today's Date: 06/04/2023  END OF SESSION:  PT End of Session - 06/04/23 1357     Visit Number 1    Number of Visits 12    Date for PT Re-Evaluation 07/16/23    Authorization Type [employer]    Authorization Time Period 06/04/23-07/16/23    Progress Note Due on Visit 10    PT Start Time 1345    PT Stop Time 1430    PT Time Calculation (min) 45 min    Activity Tolerance Patient tolerated treatment well;No increased pain    Behavior During Therapy WFL for tasks assessed/performed             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, right    Cataract cortical, senile, left    Colon polyp    COVID-19 02/26/2023   Diverticulitis 10/2022   Diverticulosis    Fibrocystic breast disease    GERD (gastroesophageal reflux disease)    Hyperlipidemia    Insomnia    Osteoarthritis of left knee    Osteopenia    Past Surgical History:  Procedure Laterality Date   ABDOMINAL HYSTERECTOMY  2001   APPENDECTOMY  1956   BREAST BIOPSY Left 1996   neg   BREAST BIOPSY Left 04/06/2021   stereo bx, ribbon clip, neg   CATARACT EXTRACTION W/ INTRAOCULAR LENS IMPLANT Left 01/2023   CATARACT EXTRACTION W/ INTRAOCULAR LENS IMPLANT Right 03/2023   COLONOSCOPY N/A 12/10/2014   Procedure: COLONOSCOPY;  Surgeon: Christena Deem, MD;  Location: [facility] ENDOSCOPY;  Service: Endoscopy;  Laterality: N/A;   COLONOSCOPY WITH PROPOFOL N/A 11/21/2020   Procedure: COLONOSCOPY WITH PROPOFOL;  Surgeon: Wyline Mood, MD;  Location: [facility] ENDOSCOPY;  Service: Gastroenterology;  Laterality: N/A;   EXCISION MASS UPPER EXTREMETIES Right 08/09/2021   Procedure: Excision of benign neoplasm right index PIP joint and repair of extensor tendon;  Surgeon: Christena Flake, MD;  Location: [facility] ORS;  Service: Orthopedics;  Laterality: Right;    MASS EXCISION Right 03/19/2023   Procedure: EXCISION OF RECURRENT SOFT TISSUE MASS OF RIGHT INDEX FINGER;  Surgeon: Christena Flake, MD;  Location: [facility] ORS;  Service: Orthopedics;  Laterality: Right;   ORIF ANKLE FRACTURE Right 2006   2 screws and pin   TOTAL KNEE ARTHROPLASTY Left 05/16/2023   Procedure: TOTAL KNEE ARTHROPLASTY;  Surgeon: Christena Flake, MD;  Location: [facility] ORS;  Service: Orthopedics;  Laterality: Left;   Patient Active Problem List   Diagnosis Date Noted   Status post total knee replacement using cement, left 05/16/2023   Diverticulitis of large intestine with abscess without bleeding 10/29/2022   Diverticulitis 10/26/2022   Insomnia 10/26/2022   Abdominal pain 10/26/2022   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  PERTINENT HISTORY: 72yoF referred for OPPT s/p Left TKA 10/24. Pt DC to home on POD1, then took HHPT for 2 weeks. Pt reports successful compliance and tolerance with HEP performance, achieved up to 96 degrees knee flexion at DC from HHPT. Pt reports knee well controlled overall, swelling moderate. Pt plans to return to work at Jan 13 at the latest. Pt transitioned from RW to [facility] ~2 weeks postop.  PAIN:  Are you having pain? 3/10 anterior knee pain   PRECAUTIONS: Knee  WEIGHT BEARING RESTRICTIONS: WBAT   FALLS:  Has patient fallen in last 6  months? None   LIVING ENVIRONMENT: Lives with: Husband  Lives in: [place], 1 level   Stairs: 3 steps, 1 railing  Has following equipment at home: RW, [facility]    OCCUPATION: Works for [employer], planning to return in January if possible; works 20 hours weekly, some sitting, some time on feet   PLOF: unlimited walking without device   PATIENT GOALS: return to prior level   NEXT MD VISIT: FU in several weeks   OBJECTIVE:  Note: Objective measures were completed at Evaluation unless otherwise noted.  PATIENT SURVEYS:  FOTO: 47    LOWER EXTREMITY ROM:  Active ROM Left eval  Knee flexion 100  Knee extension 14   (Blank rows = not tested)  FUNCTIONAL TESTS:  -314ft AMB: 11sec, SPC RUE (0.34m/s)  -minimal antalgia, minimal asymmetry   TODAY'S TREATMENT:                                                                                                                              DATE:   -Evaluation only; no intervention today    PATIENT EDUCATION:  Education details: HEP updates, ROM focus Person educated: Patient  Education method: 1:1 conversation  Education comprehension: 90%   HOME EXERCISE PROGRAM: Access Code: [email or web address] URL: [email or web address] Date: 06/04/2023 Prepared by: Alvera Novel  Exercises - Seated Knee Extension Stretch with Chair  - 2 x daily - 7 x weekly - 1 sets - 10 reps - 10 hold - Walk 3x daily for 2 minutes   (Carryovers from HPT continued as follows: supine hip ABDCT/ADD, mini squats, standing hip ABDCT bilat, standing heel raises x10)   ASSESSMENT:  CLINICAL IMPRESSION: Assessment this date revealing of antalgic gait, intolerance to walking and symmetrical, sequential transfers, in ability to stoop, squat, or kneel, joint hypomobility with reduced ROM of knee joint. Pt's pain is at goal, flexion ROM better than typical per timeline, extension ROM average for timeline. Asked pt to conitnue with most recent HEP, however added in  knee extension ROM exercise for home and have assigned TID walking of 2 minutes duration. Pt will gain all benefits from a skilled, guided, and evidence based intervention to achieve goals of care and return to PLOF in ADL, IADL, work, and leisure.   OBJECTIVE IMPAIRMENTS: Abnormal gait, decreased activity tolerance, decreased balance, decreased endurance, decreased knowledge of use of  DME, decreased mobility, difficulty walking, decreased ROM, decreased strength, hypomobility, increased fascial restrictions, increased muscle spasms, and impaired flexibility.   ACTIVITY LIMITATIONS: carrying, lifting, standing, squatting, stairs, transfers, bed mobility, and locomotion level  PARTICIPATION LIMITATIONS: meal prep, cleaning, laundry, medication management, interpersonal relationship, driving, shopping, community activity, occupation, and yard work  PERSONAL FACTORS: Education, Financial risk analyst, Past/current experiences, Profession, Social background, and Transportation are also affecting patient's functional outcome.   REHAB POTENTIAL: Excellent  CLINICAL DECISION MAKING: Stable/uncomplicated  EVALUATION COMPLEXITY: Moderate   GOALS: Goals reviewed with patient? No  LONG TERM GOALS: Target date: 07/16/23  FOTO survey score increase by >14 points.  Baseline: 47 Goal status: INITIAL  2.  5xSTS hands free, symmetrical footing x11 seconds or even less.  Baseline:  Goal status: INITIAL  3.  Left knee ROM extension <10 and flexion >125 to facilitate ability to sit in low sports car bucket seats Baseline:  Goal status: INITIAL  4.  faster than 1.41m/s to facilitate optimal ergonomics for acessing the community for IADL  Baseline:  Goal status: INITIAL  5.  >1685ft without increase in pain >2 points and without any episodes of knee buckling.  Baseline: 328ft AMB at evaluation  Goal status: INITIAL  6.  SLS >15sec bilateral  Baseline: defer to later visits  Goal status:  INITIAL   PLAN:  PT FREQUENCY: 1-2x/week  PT DURATION: 6 weeks  PLANNED INTERVENTIONS: 97110-Therapeutic exercises, 97530- Therapeutic activity, [id number]- Neuromuscular re-education, 97535- Self Care, [id number]- Manual therapy, [phone]- Gait training, [phone]- Prosthetic training, [phone]- Electrical stimulation (unattended), [phone]- Electrical stimulation (manual), Patient/Family education, Balance training, Stair training, and Dry Needling  PLAN FOR NEXT SESSION: baseline, update HEP (DC supine hip ABDCT/ADD; transition standing hipp ABDCT to side stepping; transition minisquat to elevated STS)    Shota Kohrs C, PT 06/04/2023, 3:38 PM    3:45 PM, 06/04/23 Rosamaria Lints, PT, DPT Physical Therapist - [place] [phone] (Office)

## 2023-06-07 ENCOUNTER — Other Ambulatory Visit: Payer: Self-pay

## 2023-06-07 MED ORDER — BACLOFEN 10 MG PO TABS
5.0000 mg | ORAL_TABLET | Freq: Every evening | ORAL | 1 refills | Status: DC | PRN
  Filled 2023-06-07: qty 30, 30d supply, fill #0

## 2023-06-11 ENCOUNTER — Ambulatory Visit: Payer: Medicare PPO

## 2023-06-11 DIAGNOSIS — M25662 Stiffness of left knee, not elsewhere classified: Secondary | ICD-10-CM | POA: Diagnosis not present

## 2023-06-11 DIAGNOSIS — R262 Difficulty in walking, not elsewhere classified: Secondary | ICD-10-CM

## 2023-06-11 NOTE — Therapy (Signed)
OUTPATIENT PHYSICAL THERAPY TREATMENT   Patient Name: Patricia Oliver MRN: [id number] DOB:11/15/1950, 72 y.o., female Today's Date: 06/11/2023  END OF SESSION:  PT End of Session - 06/11/23 1434     Visit Number 2    Number of Visits 12    Date for PT Re-Evaluation 07/16/23    Authorization Type [employer]    Authorization Time Period 06/04/23-07/16/23    Progress Note Due on Visit 10    PT Start Time 1434    PT Stop Time 1515    PT Time Calculation (min) 41 min    Activity Tolerance Patient tolerated treatment well;No increased pain    Behavior During Therapy WFL for tasks assessed/performed              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, right    Cataract cortical, senile, left    Colon polyp    COVID-19 02/26/2023   Diverticulitis 10/2022   Diverticulosis    Fibrocystic breast disease    GERD (gastroesophageal reflux disease)    Hyperlipidemia    Insomnia    Osteoarthritis of left knee    Osteopenia    Past Surgical History:  Procedure Laterality Date   ABDOMINAL HYSTERECTOMY  2001   APPENDECTOMY  1956   BREAST BIOPSY Left 1996   neg   BREAST BIOPSY Left 04/06/2021   stereo bx, ribbon clip, neg   CATARACT EXTRACTION W/ INTRAOCULAR LENS IMPLANT Left 01/2023   CATARACT EXTRACTION W/ INTRAOCULAR LENS IMPLANT Right 03/2023   COLONOSCOPY N/A 12/10/2014   Procedure: COLONOSCOPY;  Surgeon: Christena Deem, MD;  Location: [facility] ENDOSCOPY;  Service: Endoscopy;  Laterality: N/A;   COLONOSCOPY WITH PROPOFOL N/A 11/21/2020   Procedure: COLONOSCOPY WITH PROPOFOL;  Surgeon: Wyline Mood, MD;  Location: [facility] ENDOSCOPY;  Service: Gastroenterology;  Laterality: N/A;   EXCISION MASS UPPER EXTREMETIES Right 08/09/2021   Procedure: Excision of benign neoplasm right index PIP joint and repair of extensor tendon;  Surgeon: Christena Flake, MD;  Location: [facility] ORS;  Service: Orthopedics;  Laterality: Right;    MASS EXCISION Right 03/19/2023   Procedure: EXCISION OF RECURRENT SOFT TISSUE MASS OF RIGHT INDEX FINGER;  Surgeon: Christena Flake, MD;  Location: [facility] ORS;  Service: Orthopedics;  Laterality: Right;   ORIF ANKLE FRACTURE Right 2006   2 screws and pin   TOTAL KNEE ARTHROPLASTY Left 05/16/2023   Procedure: TOTAL KNEE ARTHROPLASTY;  Surgeon: Christena Flake, MD;  Location: [facility] ORS;  Service: Orthopedics;  Laterality: Left;   Patient Active Problem List   Diagnosis Date Noted   Status post total knee replacement using cement, left 05/16/2023   Diverticulitis of large intestine with abscess without bleeding 10/29/2022   Diverticulitis 10/26/2022   Insomnia 10/26/2022   Abdominal pain 10/26/2022   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  Pt reports  she forgot to bring in her HEP, but will attempt to bring them next visit.   PERTINENT HISTORY: 72yoF referred for OPPT s/p Left TKA 10/24. Pt DC to home on POD1, then took HHPT for 2 weeks. Pt reports successful compliance and tolerance with HEP performance, achieved up to 96 degrees knee flexion at DC from HHPT. Pt reports knee well controlled overall, swelling moderate. Pt plans to return to work at Jan 13 at the latest. Pt transitioned from RW to [facility] ~2 weeks postop.  PAIN:  Are you having pain? 3/10 anterior knee pain   PRECAUTIONS: Knee  WEIGHT  BEARING RESTRICTIONS: WBAT   FALLS:  Has patient fallen in last 6 months? None   LIVING ENVIRONMENT: Lives with: Husband  Lives in: [place], 1 level   Stairs: 3 steps, 1 railing  Has following equipment at home: RW, [facility]    OCCUPATION: Works for [employer], planning to return in January if possible; works 20 hours weekly, some sitting, some time on feet   PLOF: unlimited walking without device   PATIENT GOALS: return to prior level   NEXT MD VISIT: FU in several weeks   OBJECTIVE:  Note: Objective measures were completed at Evaluation unless otherwise noted.  PATIENT SURVEYS:  FOTO: 47    LOWER EXTREMITY ROM:  Active ROM Left eval  Knee flexion 100  Knee extension 14   (Blank rows = not tested)  FUNCTIONAL TESTS:  -328ft AMB: 11sec, SPC RUE (0.12m/s)  -minimal antalgia, minimal asymmetry   TODAY'S TREATMENT: DATE: 06/11/23  TherAct:  Goal assessment continued from initial evaluation and noted in the goal section below:    TherEx:  SAQ, 2x10 on L LE only SLR, 2x10 on L LE only Supine quad sets, 3 sec holds, 2x10 Standing terminal knee extension into physioball, 2x10   Manual:  Hooklying proximal Tibiofibular Anterior-Posterior mobilizations grades I-II for pain modulation, 30 sec bouts Hooklying Tibiofemoral Posterior-Anterior mobilization, grades I-II for pain modulation, 30 sec bouts Supine patellar mobilizations in all planes of mobility for increased knee ROM, x2 minutes total  PATIENT EDUCATION:  Education details: HEP updates, ROM focus Person educated: Patient  Education method: 1:1 conversation  Education comprehension: 90%   HOME EXERCISE PROGRAM: Access Code: [email or web address] URL: [email or web address] Date: 06/04/2023 Prepared by: Alvera Novel  Exercises - Seated Knee Extension Stretch with Chair  - 2 x daily - 7 x weekly - 1 sets - 10 reps - 10 hold - Walk 3x daily for 2 minutes   (Carryovers from HPT continued as  follows: supine hip ABDCT/ADD, mini squats, standing hip ABDCT bilat, standing heel raises x10)   ASSESSMENT:  CLINICAL IMPRESSION:  Pt responded well to the exercises and manual therapy.  All goals have been established from evaluation and updated as noted below.  Pt still lacking full terminal knee extension and will be primary focus moving forward in future sessions.  Pt overall tolerated manual therapy approach without any increase in discomfort.  Pt is still sore with patellar mobilizations, however tolerates the mobilization.   Pt will continue to benefit from skilled therapy to address remaining deficits in order to improve overall QoL and return to PLOF.     OBJECTIVE IMPAIRMENTS: Abnormal gait, decreased activity tolerance, decreased balance, decreased endurance, decreased knowledge of use of DME, decreased mobility, difficulty walking, decreased ROM, decreased strength, hypomobility, increased fascial restrictions, increased muscle spasms, and impaired flexibility.   ACTIVITY LIMITATIONS: carrying, lifting, standing, squatting, stairs, transfers, bed mobility, and locomotion level  PARTICIPATION LIMITATIONS: meal prep, cleaning, laundry, medication management, interpersonal relationship, driving, shopping, community activity, occupation, and yard work  PERSONAL FACTORS: Education, Financial risk analyst, Past/current experiences, Profession, Social background, and Transportation are also affecting patient's functional outcome.   REHAB POTENTIAL: Excellent  CLINICAL DECISION MAKING: Stable/uncomplicated  EVALUATION COMPLEXITY: Moderate   GOALS: Goals reviewed with patient? No  LONG TERM GOALS: Target date: 07/16/23  FOTO survey score increase by >14 points.  Baseline: 47 Goal status: INITIAL  2.  5xSTS hands free, symmetrical footing x11 seconds or even less.  Baseline:  17.91 sec Goal status: INITIAL  3.  Left knee ROM extension <10 and flexion >125 to facilitate ability to sit in low  sports car bucket seats Baseline: L Knee Extension: 14 deg lacking; L Knee Flexion: 100 deg Goal status: INITIAL  4.  faster than 1.66m/s to facilitate optimal ergonomics for acessing the community for IADL  Baseline: 0.95 m/s Goal status: INITIAL  5.  >1650ft without increase in pain >2 points and without any episodes of knee buckling.  Baseline: 3100ft AMB at evaluation  Goal status: INITIAL  6.  SLS >15sec bilateral  Baseline: defer to later visits  Goal status: INITIAL   PLAN:  PT FREQUENCY: 1-2x/week  PT DURATION: 6 weeks  PLANNED INTERVENTIONS: 97110-Therapeutic exercises, 97530- Therapeutic activity, [id number]- Neuromuscular re-education, 97535- Self Care, 52841- Manual therapy, [phone]- Gait training, [phone]- Prosthetic training, [phone]- Electrical stimulation (unattended), [phone]- Electrical stimulation (manual), Patient/Family education, Balance training, Stair training, and Dry Needling  PLAN FOR NEXT SESSION: update HEP (DC supine hip ABDCT/ADD; transition standing hipp ABDCT to side stepping; transition minisquat to elevated STS)     Nolon Bussing, PT, DPT Physical Therapist - [facility]  06/11/23, 4:05 PM

## 2023-06-18 ENCOUNTER — Ambulatory Visit: Payer: Medicare PPO

## 2023-06-18 DIAGNOSIS — R262 Difficulty in walking, not elsewhere classified: Secondary | ICD-10-CM

## 2023-06-18 DIAGNOSIS — M25662 Stiffness of left knee, not elsewhere classified: Secondary | ICD-10-CM | POA: Diagnosis not present

## 2023-06-18 NOTE — Therapy (Signed)
OUTPATIENT PHYSICAL THERAPY TREATMENT   Patient Name: Patricia Oliver Oliver MRN: [id number] DOB:02/27/1951, 72 y.o., female Today's Date: 06/18/2023  END OF SESSION:  PT End of Session - 06/18/23 1430     Visit Number 3    Number of Visits 12    Date for PT Re-Evaluation 07/16/23    Authorization Type [employer]    Authorization Time Period 06/04/23-07/16/23    Progress Note Due on Visit 10    PT Start Time 1430    PT Stop Time 1515    PT Time Calculation (min) 45 min    Activity Tolerance Patient tolerated treatment well;No increased pain    Behavior During Therapy WFL for tasks assessed/performed              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, right    Cataract cortical, senile, left    Colon polyp    COVID-19 02/26/2023   Diverticulitis 10/2022   Diverticulosis    Fibrocystic breast disease    GERD (gastroesophageal reflux disease)    Hyperlipidemia    Insomnia    Osteoarthritis of left knee    Osteopenia    Past Surgical History:  Procedure Laterality Date   ABDOMINAL HYSTERECTOMY  2001   APPENDECTOMY  1956   BREAST BIOPSY Left 1996   neg   BREAST BIOPSY Left 04/06/2021   stereo bx, ribbon clip, neg   CATARACT EXTRACTION W/ INTRAOCULAR LENS IMPLANT Left 01/2023   CATARACT EXTRACTION W/ INTRAOCULAR LENS IMPLANT Right 03/2023   COLONOSCOPY N/A 12/10/2014   Procedure: COLONOSCOPY;  Surgeon: Christena Deem, MD;  Location: [facility] ENDOSCOPY;  Service: Endoscopy;  Laterality: N/A;   COLONOSCOPY WITH PROPOFOL N/A 11/21/2020   Procedure: COLONOSCOPY WITH PROPOFOL;  Surgeon: Wyline Mood, MD;  Location: [facility] ENDOSCOPY;  Service: Gastroenterology;  Laterality: N/A;   EXCISION MASS UPPER EXTREMETIES Right 08/09/2021   Procedure: Excision of benign neoplasm right index PIP joint and repair of extensor tendon;  Surgeon: Christena Flake, MD;  Location: [facility] ORS;  Service: Orthopedics;  Laterality: Right;    MASS EXCISION Right 03/19/2023   Procedure: EXCISION OF RECURRENT SOFT TISSUE MASS OF RIGHT INDEX FINGER;  Surgeon: Christena Flake, MD;  Location: [facility] ORS;  Service: Orthopedics;  Laterality: Right;   ORIF ANKLE FRACTURE Right 2006   2 screws and pin   TOTAL KNEE ARTHROPLASTY Left 05/16/2023   Procedure: TOTAL KNEE ARTHROPLASTY;  Surgeon: Christena Flake, MD;  Location: [facility] ORS;  Service: Orthopedics;  Laterality: Left;   Patient Active Problem List   Diagnosis Date Noted   Status post total knee replacement using cement, left 05/16/2023   Diverticulitis of large intestine with abscess without bleeding 10/29/2022   Diverticulitis 10/26/2022   Insomnia 10/26/2022   Abdominal pain 10/26/2022   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  Pt notes that  she was able to tolerate mobility in the bed without any complications.   PERTINENT HISTORY: 72yoF referred for OPPT s/p Left TKA 10/24. Pt DC to home on POD1, then took HHPT for 2 weeks. Pt reports successful compliance and tolerance with HEP performance, achieved up to 96 degrees knee flexion at DC from HHPT. Pt reports knee well controlled overall, swelling moderate. Pt plans to return to work at Jan 13 at the latest. Pt transitioned from RW to [facility] ~2 weeks postop.  PAIN:  Are you having pain? 3/10 anterior knee pain   PRECAUTIONS: Knee  WEIGHT BEARING  RESTRICTIONS: WBAT   FALLS:  Has patient fallen in last 6 months? None   LIVING ENVIRONMENT: Lives with: Husband  Lives in: [place], 1 level   Stairs: 3 steps, 1 railing  Has following equipment at home: RW, [facility]    OCCUPATION: Works for [employer], planning to return in January if possible; works 20 hours weekly, some sitting, some time on feet   PLOF: unlimited walking without device   PATIENT GOALS: return to prior level   NEXT MD VISIT: FU in several weeks   OBJECTIVE:  Note: Objective measures were completed at Evaluation unless otherwise noted.  PATIENT SURVEYS:  FOTO: 47    LOWER EXTREMITY ROM:  Active ROM Left eval  Knee flexion 100  Knee extension 14   (Blank rows = not tested)  06/18/23: Flexion: 104 deg; Extension: 8 deg    FUNCTIONAL TESTS:  -380ft AMB: 11sec, SPC RUE (0.47m/s)  -minimal antalgia, minimal asymmetry   TODAY'S TREATMENT: DATE: 06/18/23  TherEx:  SAQ, 2x10 on L LE only SLR, 2x10 on L LE only Supine quad sets, 3 sec holds, 2x10 Standing terminal knee extension into physioball, 2x10 Prone knee extension hang with 2.5# weight, 60 sec bouts x2 Prone knee flexion with overpressure applied by therapist, 30 sec bouts x2  Manual:  Hooklying proximal Tibiofibular Anterior-Posterior mobilizations grades I-II for pain modulation, 30 sec bouts Hooklying Tibiofemoral Posterior-Anterior mobilization, grades I-II for pain modulation, 30 sec bouts Supine patellar mobilizations in all planes of mobility for increased knee ROM, x2 minutes total    PATIENT EDUCATION:  Education details: HEP updates, ROM focus Person educated: Patient  Education method: 1:1 conversation  Education comprehension: 90%   HOME EXERCISE PROGRAM: Access Code: [email or web address] URL: [email or web address] Date: 06/04/2023 Prepared by: Alvera Novel  Exercises - Seated Knee Extension Stretch with Chair  - 2 x daily - 7 x weekly - 1 sets - 10 reps - 10  hold - Walk 3x daily for 2 minutes   (Carryovers from HPT continued as follows: supine hip ABDCT/ADD, mini squats, standing hip ABDCT bilat, standing heel raises x10)   ASSESSMENT:  CLINICAL IMPRESSION:  Pt responded well to the exercises and manual therapy performed at this visit.  Pt to benefit from continued manual therapy and will continue with current HEP in order to improve overall ROM of the knee, and to improve strength and stability of the knee as well.  Pt encouraged by progress made with therapy thus far.   Pt will continue to benefit from skilled therapy to address remaining deficits in order to improve overall QoL and return to PLOF.      OBJECTIVE IMPAIRMENTS: Abnormal gait, decreased activity tolerance, decreased balance, decreased endurance, decreased knowledge of use of DME, decreased mobility, difficulty walking, decreased ROM, decreased strength, hypomobility, increased fascial restrictions, increased muscle spasms, and impaired flexibility.   ACTIVITY LIMITATIONS: carrying, lifting, standing, squatting,  stairs, transfers, bed mobility, and locomotion level  PARTICIPATION LIMITATIONS: meal prep, cleaning, laundry, medication management, interpersonal relationship, driving, shopping, community activity, occupation, and yard work  PERSONAL FACTORS: Education, Financial risk analyst, Past/current experiences, Profession, Social background, and Transportation are also affecting patient's functional outcome.   REHAB POTENTIAL: Excellent  CLINICAL DECISION MAKING: Stable/uncomplicated  EVALUATION COMPLEXITY: Moderate   GOALS: Goals reviewed with patient? No  LONG TERM GOALS: Target date: 07/16/23  FOTO survey score increase by >14 points.  Baseline: 47 Goal status: INITIAL  2.  5xSTS hands free, symmetrical footing x11 seconds or even less.  Baseline:  17.91 sec Goal status: INITIAL  3.  Left knee ROM extension <10 and flexion >125 to facilitate ability to sit in low sports car  bucket seats Baseline: L Knee Extension: 14 deg lacking; L Knee Flexion: 100 deg Goal status: INITIAL  4.  faster than 1.60m/s to facilitate optimal ergonomics for acessing the community for IADL  Baseline: 0.95 m/s Goal status: INITIAL  5.  >1679ft without increase in pain >2 points and without any episodes of knee buckling.  Baseline: 3109ft AMB at evaluation  Goal status: INITIAL  6.  SLS >15sec bilateral  Baseline: defer to later visits  Goal status: INITIAL   PLAN:  PT FREQUENCY: 1-2x/week  PT DURATION: 6 weeks  PLANNED INTERVENTIONS: 97110-Therapeutic exercises, 97530- Therapeutic activity, [id number]- Neuromuscular re-education, 97535- Self Care, 91478- Manual therapy, [phone]- Gait training, [phone]- Prosthetic training, [phone]- Electrical stimulation (unattended), [phone]- Electrical stimulation (manual), Patient/Family education, Balance training, Stair training, and Dry Needling  PLAN FOR NEXT SESSION: update HEP (DC supine hip ABDCT/ADD; transition standing hipp ABDCT to side stepping; transition minisquat to elevated STS)     Nolon Bussing, PT, DPT Physical Therapist - [facility]  06/18/23, 4:13 PM

## 2023-06-24 ENCOUNTER — Other Ambulatory Visit: Payer: Self-pay

## 2023-06-24 MED ORDER — SIMVASTATIN 40 MG PO TABS
40.0000 mg | ORAL_TABLET | Freq: Every day | ORAL | 3 refills | Status: AC
  Filled 2023-06-24: qty 90, 90d supply, fill #0
  Filled 2023-09-24: qty 90, 90d supply, fill #1
  Filled 2024-04-02: qty 90, 90d supply, fill #2

## 2023-06-24 MED ORDER — CELECOXIB 200 MG PO CAPS
200.0000 mg | ORAL_CAPSULE | Freq: Two times a day (BID) | ORAL | 1 refills | Status: DC
  Filled 2023-06-24: qty 180, 90d supply, fill #0
  Filled 2023-10-16: qty 180, 90d supply, fill #1

## 2023-06-24 MED ORDER — AZELASTINE HCL 0.05 % OP SOLN
1.0000 [drp] | Freq: Every day | OPHTHALMIC | 5 refills | Status: AC
  Filled 2023-06-24: qty 6, 60d supply, fill #0
  Filled 2023-09-04: qty 6, 60d supply, fill #1
  Filled 2023-11-01: qty 6, 60d supply, fill #2
  Filled 2024-04-20: qty 6, 60d supply, fill #3

## 2023-06-24 MED ORDER — MONTELUKAST SODIUM 10 MG PO TABS
10.0000 mg | ORAL_TABLET | Freq: Every day | ORAL | 3 refills | Status: AC
  Filled 2023-06-24: qty 90, 90d supply, fill #0
  Filled 2023-09-24: qty 90, 90d supply, fill #1

## 2023-06-25 ENCOUNTER — Ambulatory Visit: Payer: Medicare PPO | Attending: Surgery

## 2023-06-25 DIAGNOSIS — M25662 Stiffness of left knee, not elsewhere classified: Secondary | ICD-10-CM | POA: Diagnosis present

## 2023-06-25 DIAGNOSIS — R262 Difficulty in walking, not elsewhere classified: Secondary | ICD-10-CM | POA: Insufficient documentation

## 2023-06-25 NOTE — Therapy (Signed)
OUTPATIENT PHYSICAL THERAPY TREATMENT   Patient Name: Patricia Oliver MRN: [id number] DOB:1951-06-23, 72 y.o., female Today's Date: 06/25/2023  END OF SESSION:  PT End of Session - 06/25/23 1437     Visit Number 4    Number of Visits 12    Date for PT Re-Evaluation 07/16/23    Authorization Type [employer]    Authorization Time Period 06/04/23-07/16/23    Progress Note Due on Visit 10    PT Start Time 1437    PT Stop Time 1520    PT Time Calculation (min) 43 min    Activity Tolerance Patient tolerated treatment well;No increased pain    Behavior During Therapy WFL for tasks assessed/performed              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, right    Cataract cortical, senile, left    Colon polyp    COVID-19 02/26/2023   Diverticulitis 10/2022   Diverticulosis    Fibrocystic breast disease    GERD (gastroesophageal reflux disease)    Hyperlipidemia    Insomnia    Osteoarthritis of left knee    Osteopenia    Past Surgical History:  Procedure Laterality Date   ABDOMINAL HYSTERECTOMY  2001   APPENDECTOMY  1956   BREAST BIOPSY Left 1996   neg   BREAST BIOPSY Left 04/06/2021   stereo bx, ribbon clip, neg   CATARACT EXTRACTION W/ INTRAOCULAR LENS IMPLANT Left 01/2023   CATARACT EXTRACTION W/ INTRAOCULAR LENS IMPLANT Right 03/2023   COLONOSCOPY N/A 12/10/2014   Procedure: COLONOSCOPY;  Surgeon: Christena Deem, MD;  Location: [facility] ENDOSCOPY;  Service: Endoscopy;  Laterality: N/A;   COLONOSCOPY WITH PROPOFOL N/A 11/21/2020   Procedure: COLONOSCOPY WITH PROPOFOL;  Surgeon: Wyline Mood, MD;  Location: [facility] ENDOSCOPY;  Service: Gastroenterology;  Laterality: N/A;   EXCISION MASS UPPER EXTREMETIES Right 08/09/2021   Procedure: Excision of benign neoplasm right index PIP joint and repair of extensor tendon;  Surgeon: Christena Flake, MD;  Location: [facility] ORS;  Service: Orthopedics;  Laterality: Right;    MASS EXCISION Right 03/19/2023   Procedure: EXCISION OF RECURRENT SOFT TISSUE MASS OF RIGHT INDEX FINGER;  Surgeon: Christena Flake, MD;  Location: [facility] ORS;  Service: Orthopedics;  Laterality: Right;   ORIF ANKLE FRACTURE Right 2006   2 screws and pin   TOTAL KNEE ARTHROPLASTY Left 05/16/2023   Procedure: TOTAL KNEE ARTHROPLASTY;  Surgeon: Christena Flake, MD;  Location: [facility] ORS;  Service: Orthopedics;  Laterality: Left;   Patient Active Problem List   Diagnosis Date Noted   Status post total knee replacement using cement, left 05/16/2023   Diverticulitis of large intestine with abscess without bleeding 10/29/2022   Diverticulitis 10/26/2022   Insomnia 10/26/2022   Abdominal pain 10/26/2022   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  Rationale for Evaluation and Treatment: Rehabilitation  ONSET DATE: 05/16/23  SUBJECTIVE:   SUBJECTIVE STATEMENT:  Pt reports she is still having tightness in the morning, and it loosens up, and the swelling reduces.  Pt notes she's in a little  bit of discomfort upon entering into the clinic 2/10.   PERTINENT HISTORY: 72yoF referred for OPPT s/p Left TKA 10/24. Pt DC to home on POD1, then took HHPT for 2 weeks. Pt reports successful compliance and tolerance with HEP performance, achieved up to 96 degrees knee flexion at DC from HHPT. Pt reports knee well controlled overall, swelling moderate. Pt plans to return to work at Jan 13 at the latest. Pt transitioned from RW to [facility] ~2 weeks postop.  PAIN:  Are you having pain? 3/10 anterior knee pain   PRECAUTIONS: Knee  WEIGHT BEARING RESTRICTIONS: WBAT   FALLS:  Has patient  fallen in last 6 months? None   LIVING ENVIRONMENT: Lives with: Husband  Lives in: [place], 1 level   Stairs: 3 steps, 1 railing  Has following equipment at home: RW, [facility]    OCCUPATION: Works for [employer], planning to return in January if possible; works 20 hours weekly, some sitting, some time on feet   PLOF: unlimited walking without device   PATIENT GOALS: return to prior level   NEXT MD VISIT: FU in several weeks   OBJECTIVE:  Note: Objective measures were completed at Evaluation unless otherwise noted.  PATIENT SURVEYS:  FOTO: 47    LOWER EXTREMITY ROM:  Active ROM Left eval  Knee flexion 100  Knee extension 14   (Blank rows = not tested)  06/18/23: Flexion: 104 deg; Extension: 8 deg 06/25/23: Flexion: 120 deg; Extension: 7 deg    FUNCTIONAL TESTS:  -366ft AMB: 11sec, SPC RUE (0.52m/s)  -minimal antalgia, minimal asymmetry   TODAY'S TREATMENT: DATE: 06/25/23  TherEx:  Seated matrix biking, L1-:L2 for 5 minutes for increased ROM and reduced swelling of the knee Squats to doing butt taps on chair with TRX straps, 2x10 Standing forward lunges with Ue support at stairs, 2x10 Standing hamstring stretch with L LE on second steps, 30 sec bouts x2 Backwards ambulation for improved terminal knee extension Standing terminal knee extension into green physioball, 3 sec holds, 2x10 Sled push x2 laps with no additional weight for increased strengthening of the LE's2      PATIENT EDUCATION:  Education details: HEP updates, ROM focus Person educated: Patient  Education method: 1:1 conversation  Education comprehension: 90%   HOME EXERCISE PROGRAM: Access Code: [email or web address] URL: [email or web address] Date: 06/04/2023 Prepared by: Alvera Novel  Exercises - Seated Knee Extension Stretch with Chair  - 2 x daily - 7 x weekly - 1 sets - 10 reps - 10 hold - Walk 3x daily for 2 minutes   (Carryovers from HPT continued as follows: supine hip  ABDCT/ADD, mini squats, standing hip ABDCT bilat, standing heel raises x10)   ASSESSMENT:  CLINICAL IMPRESSION:  Pt continues to perform well with the exercises given and noted to have improved ROM as noted above.  Pt has made good progress in R knee flexion, however is still lacking full extension.  Pt encouraged to continue with HEP in order to focus on ROM and strengthening of the quads necessary for proper ambulation.   Pt will continue to benefit from skilled therapy to address remaining deficits in order to improve overall QoL and return to PLOF.       OBJECTIVE IMPAIRMENTS: Abnormal gait, decreased activity tolerance, decreased balance, decreased endurance, decreased knowledge of use of DME, decreased mobility, difficulty walking, decreased ROM, decreased strength, hypomobility, increased fascial restrictions, increased muscle spasms, and impaired flexibility.   ACTIVITY LIMITATIONS: carrying, lifting, standing, squatting, stairs, transfers, bed mobility, and  locomotion level  PARTICIPATION LIMITATIONS: meal prep, cleaning, laundry, medication management, interpersonal relationship, driving, shopping, community activity, occupation, and yard work  PERSONAL FACTORS: Education, Financial risk analyst, Past/current experiences, Profession, Social background, and Transportation are also affecting patient's functional outcome.   REHAB POTENTIAL: Excellent  CLINICAL DECISION MAKING: Stable/uncomplicated  EVALUATION COMPLEXITY: Moderate   GOALS: Goals reviewed with patient? No  LONG TERM GOALS: Target date: 07/16/23  FOTO survey score increase by >14 points.  Baseline: 47 Goal status: INITIAL  2.  5xSTS hands free, symmetrical footing x11 seconds or even less.  Baseline:  17.91 sec Goal status: INITIAL  3.  Left knee ROM extension <10 and flexion >125 to facilitate ability to sit in low sports car bucket seats Baseline: L Knee Extension: 14 deg lacking; L Knee Flexion: 100 deg Goal status:  INITIAL  4.  faster than 1.74m/s to facilitate optimal ergonomics for acessing the community for IADL  Baseline: 0.95 m/s Goal status: INITIAL  5.  >1668ft without increase in pain >2 points and without any episodes of knee buckling.  Baseline: 349ft AMB at evaluation  Goal status: INITIAL  6.  SLS >15sec bilateral  Baseline: defer to later visits  Goal status: INITIAL   PLAN:  PT FREQUENCY: 1-2x/week  PT DURATION: 6 weeks  PLANNED INTERVENTIONS: 97110-Therapeutic exercises, 97530- Therapeutic activity, [id number]- Neuromuscular re-education, 97535- Self Care, [id number]- Manual therapy, [id number]- Gait training, [phone]- Prosthetic training, [phone]- Electrical stimulation (unattended), [phone]- Electrical stimulation (manual), Patient/Family education, Balance training, Stair training, and Dry Needling  PLAN FOR NEXT SESSION: continue strengthening LE's and improve terminal knee extension.    Nolon Bussing, PT, DPT Physical Therapist - [facility]  06/25/23, 2:37 PM

## 2023-06-27 ENCOUNTER — Ambulatory Visit: Payer: Medicare PPO

## 2023-06-27 DIAGNOSIS — M25662 Stiffness of left knee, not elsewhere classified: Secondary | ICD-10-CM

## 2023-06-27 DIAGNOSIS — R262 Difficulty in walking, not elsewhere classified: Secondary | ICD-10-CM

## 2023-06-27 NOTE — Therapy (Signed)
OUTPATIENT PHYSICAL THERAPY TREATMENT   Patient Name: Patricia Oliver MRN: [id number] DOB:[birth year], 72 y.o., female Today's Date: 06/27/2023  END OF SESSION:  PT End of Session - 06/27/23 1349     Visit Number 5    Number of Visits 12    Date for PT Re-Evaluation 07/16/23    Authorization Type [employer]    Authorization Time Period 06/04/23-07/16/23    Progress Note Due on Visit 10    PT Start Time 1347    PT Stop Time 1430    PT Time Calculation (min) 43 min    Activity Tolerance Patient tolerated treatment well;No increased pain    Behavior During Therapy WFL for tasks assessed/performed              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, right    Cataract cortical, senile, left    Colon polyp    COVID-19 02/26/2023   Diverticulitis 10/2022   Diverticulosis    Fibrocystic breast disease    GERD (gastroesophageal reflux disease)    Hyperlipidemia    Insomnia    Osteoarthritis of left knee    Osteopenia    Past Surgical History:  Procedure Laterality Date   ABDOMINAL HYSTERECTOMY  2001   APPENDECTOMY  1956   BREAST BIOPSY Left 1996   neg   BREAST BIOPSY Left 04/06/2021   stereo bx, ribbon clip, neg   CATARACT EXTRACTION W/ INTRAOCULAR LENS IMPLANT Left 01/2023   CATARACT EXTRACTION W/ INTRAOCULAR LENS IMPLANT Right 03/2023   COLONOSCOPY N/A 12/10/2014   Procedure: COLONOSCOPY;  Surgeon: Christena Deem, MD;  Location: [facility] ENDOSCOPY;  Service: Endoscopy;  Laterality: N/A;   COLONOSCOPY WITH PROPOFOL N/A 11/21/2020   Procedure: COLONOSCOPY WITH PROPOFOL;  Surgeon: Wyline Mood, MD;  Location: [facility] ENDOSCOPY;  Service: Gastroenterology;  Laterality: N/A;   EXCISION MASS UPPER EXTREMETIES Right 08/09/2021   Procedure: Excision of benign neoplasm right index PIP joint and repair of extensor tendon;  Surgeon: Christena Flake, MD;  Location: [facility] ORS;  Service: Orthopedics;  Laterality: Right;    MASS EXCISION Right 03/19/2023   Procedure: EXCISION OF RECURRENT SOFT TISSUE MASS OF RIGHT INDEX FINGER;  Surgeon: Christena Flake, MD;  Location: [facility] ORS;  Service: Orthopedics;  Laterality: Right;   ORIF ANKLE FRACTURE Right 2006   2 screws and pin   TOTAL KNEE ARTHROPLASTY Left 05/16/2023   Procedure: TOTAL KNEE ARTHROPLASTY;  Surgeon: Christena Flake, MD;  Location: [facility] ORS;  Service: Orthopedics;  Laterality: Left;   Patient Active Problem List   Diagnosis Date Noted   Status post total knee replacement using cement, left 05/16/2023   Diverticulitis of large intestine with abscess without bleeding 10/29/2022   Diverticulitis 10/26/2022   Insomnia 10/26/2022   Abdominal pain 10/26/2022   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, right 07/21/2021   Change in bowel habits 10/26/2020   Primary osteoarthritis of left knee 04/30/2018   Allergic rhinitis 11/19/2013   Esophageal reflux 11/19/2013   HLD (hyperlipidemia) 11/19/2013    PCP: Kandyce Rud, MD   REFERRING PROVIDER: Gayla Doss, MD   REFERRING DIAG:   THERAPY DIAG:  Stiffness of left knee, not elsewhere classified  Difficulty in walking, not elsewhere classified  Rationale for Evaluation and Treatment: Rehabilitation  ONSET DATE: 05/16/23  SUBJECTIVE:   SUBJECTIVE STATEMENT:  Pt reports some increased swelling of the knee and tightness following the last treatment  session.  Pt otherwise is doing well, and is ready to begin therapy.   PERTINENT HISTORY: 72yoF referred for OPPT s/p Left TKA 10/24. Pt DC to home on POD1, then took HHPT for 2 weeks. Pt reports successful compliance and tolerance with HEP performance, achieved up to 96 degrees knee flexion at DC from HHPT. Pt reports knee well controlled overall, swelling moderate. Pt plans to return to work at Jan 13 at the latest. Pt transitioned from RW to [facility] ~2 weeks postop.  PAIN:  Are you having pain? 3/10 anterior knee pain   PRECAUTIONS:  Knee  WEIGHT BEARING RESTRICTIONS: WBAT   FALLS:  Has patient fallen in last 6 months? None   LIVING ENVIRONMENT: Lives with: Husband  Lives in: [place], 1 level   Stairs: 3 steps, 1 railing  Has following equipment at home: RW, [employer]    OCCUPATION: Works for [employer], planning to return in January if possible; works 20 hours weekly, some sitting, some time on feet   PLOF: unlimited walking without device   PATIENT GOALS: return to prior level   NEXT MD VISIT: FU in several weeks   OBJECTIVE:  Note: Objective measures were completed at Evaluation unless otherwise noted.  PATIENT SURVEYS:  FOTO: 47    LOWER EXTREMITY ROM:  Active ROM Left eval  Knee flexion 100  Knee extension 14   (Blank rows = not tested)  06/18/23: Flexion: 104 deg; Extension: 8 deg 06/25/23: Flexion: 120 deg; Extension: 7 deg    FUNCTIONAL TESTS:  -398ft AMB: 11sec, SPC RUE (0.29m/s)  -minimal antalgia, minimal asymmetry   TODAY'S TREATMENT: DATE: 06/27/23   TherEx:  Seated matrix biking, L1 for 5 minutes, 2.5 min forward/2.5 min backward, for increased ROM and reduced swelling of the knee  Prone knee hangs with 4# AW's, 2 minute bouts x2 Prone hip extension with 4# AW's, 2x10 Prone hamstring curls with 4# AW's, 2x10 Prone PROM with overpressure into knee flexion, 30 sec holds x4  Standing forward lunges with UE support at stairs, 2x10 Standing hamstring stretch with L LE on second steps, 30 sec bouts x2    PATIENT EDUCATION:  Education details: HEP updates, ROM focus Person educated: Patient  Education method: 1:1 conversation  Education comprehension: 90%   HOME EXERCISE PROGRAM: Access Code: [email or web address] URL: [email or web address] Date: 06/04/2023 Prepared by: Alvera Novel  Exercises - Seated Knee Extension Stretch with Chair  - 2 x daily - 7 x weekly - 1 sets - 10 reps - 10 hold - Walk 3x daily for 2 minutes   (Carryovers from HPT continued as follows:  supine hip ABDCT/ADD, mini squats, standing hip ABDCT bilat, standing heel raises x10)   ASSESSMENT:  CLINICAL IMPRESSION:  Pt continues to perform well with exercises and is able to achieve the same ROM even with self-reported knee swelling and tightness.  Pt continues to improve in strength as well and will continue to improve with continued therapy.  Pt encouraged to continue to perform HEP in order to improve terminal knee extension and knee flexion for improved mobility.   Pt will continue to benefit from skilled therapy to address remaining deficits in order to improve overall QoL and return to PLOF.        OBJECTIVE IMPAIRMENTS: Abnormal gait, decreased activity tolerance, decreased balance, decreased endurance, decreased knowledge of use of DME, decreased mobility, difficulty walking, decreased ROM, decreased strength, hypomobility, increased fascial restrictions, increased muscle spasms, and impaired flexibility.   ACTIVITY LIMITATIONS: carrying, lifting,  standing, squatting, stairs, transfers, bed mobility, and locomotion level  PARTICIPATION LIMITATIONS: meal prep, cleaning, laundry, medication management, interpersonal relationship, driving, shopping, community activity, occupation, and yard work  PERSONAL FACTORS: Education, Financial risk analyst, Past/current experiences, Profession, Social background, and Transportation are also affecting patient's functional outcome.   REHAB POTENTIAL: Excellent  CLINICAL DECISION MAKING: Stable/uncomplicated  EVALUATION COMPLEXITY: Moderate   GOALS: Goals reviewed with patient? No  LONG TERM GOALS: Target date: 07/16/23  FOTO survey score increase by >14 points.  Baseline: 47 Goal status: INITIAL  2.  5xSTS hands free, symmetrical footing x11 seconds or even less.  Baseline:  17.91 sec Goal status: INITIAL  3.  Left knee ROM extension <10 and flexion >125 to facilitate ability to sit in low sports car bucket seats Baseline: L Knee  Extension: 14 deg lacking; L Knee Flexion: 100 deg Goal status: INITIAL  4.  faster than 1.53m/s to facilitate optimal ergonomics for acessing the community for IADL  Baseline: 0.95 m/s Goal status: INITIAL  5.  >1666ft without increase in pain >2 points and without any episodes of knee buckling.  Baseline: 377ft AMB at evaluation  Goal status: INITIAL  6.  SLS >15sec bilateral  Baseline: defer to later visits  Goal status: INITIAL   PLAN:  PT FREQUENCY: 1-2x/week  PT DURATION: 6 weeks  PLANNED INTERVENTIONS: 97110-Therapeutic exercises, 97530- Therapeutic activity, [id number]- Neuromuscular re-education, 97535- Self Care, 16109- Manual therapy, [id number]- Gait training, [phone]- Prosthetic training, [phone]- Electrical stimulation (unattended), [phone]- Electrical stimulation (manual), Patient/Family education, Balance training, Stair training, and Dry Needling  PLAN FOR NEXT SESSION:  continue strengthening LE's and improve terminal knee extension.    Nolon Bussing, PT, DPT Physical Therapist - [facility]  06/27/23, 1:51 PM

## 2023-07-01 ENCOUNTER — Ambulatory Visit: Payer: Medicare PPO

## 2023-07-01 ENCOUNTER — Telehealth: Payer: Self-pay | Admitting: Gastroenterology

## 2023-07-01 ENCOUNTER — Telehealth: Payer: Self-pay

## 2023-07-01 DIAGNOSIS — R262 Difficulty in walking, not elsewhere classified: Secondary | ICD-10-CM

## 2023-07-01 DIAGNOSIS — M25662 Stiffness of left knee, not elsewhere classified: Secondary | ICD-10-CM

## 2023-07-01 NOTE — Telephone Encounter (Signed)
Patient called in to reschedule her colonoscopy because she just had a knee replacement.

## 2023-07-01 NOTE — Telephone Encounter (Signed)
Patient called and left me a voicemail that she recently had knee replacement and had an upcoming colonoscopy. She stated that she would like to postpone. I then called her back and left her a voicemail to call me back.

## 2023-07-01 NOTE — Telephone Encounter (Signed)
Patient called in left a voicemail stating she returning [place] call. I called the patient back to inform her we receive her message, and I sent the message to the nurse.

## 2023-07-01 NOTE — Therapy (Signed)
OUTPATIENT PHYSICAL THERAPY TREATMENT   Patient Name: Patricia Oliver MRN: [id number] DOB:[birth year], 72 y.o., female Today's Date: 07/01/2023  END OF SESSION:  PT End of Session - 07/01/23 1348     Visit Number 6    Number of Visits 12    Date for PT Re-Evaluation 07/16/23    Authorization Type [employer]    Authorization Time Period 06/04/23-07/16/23    Progress Note Due on Visit 10    PT Start Time 1348    PT Stop Time 1430    PT Time Calculation (min) 42 min    Activity Tolerance Patient tolerated treatment well;No increased pain    Behavior During Therapy WFL for tasks assessed/performed              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, right    Cataract cortical, senile, left    Colon polyp    COVID-19 02/26/2023   Diverticulitis 10/2022   Diverticulosis    Fibrocystic breast disease    GERD (gastroesophageal reflux disease)    Hyperlipidemia    Insomnia    Osteoarthritis of left knee    Osteopenia    Past Surgical History:  Procedure Laterality Date   ABDOMINAL HYSTERECTOMY  2001   APPENDECTOMY  1956   BREAST BIOPSY Left 1996   neg   BREAST BIOPSY Left 04/06/2021   stereo bx, ribbon clip, neg   CATARACT EXTRACTION W/ INTRAOCULAR LENS IMPLANT Left 01/2023   CATARACT EXTRACTION W/ INTRAOCULAR LENS IMPLANT Right 03/2023   COLONOSCOPY N/A 12/10/2014   Procedure: COLONOSCOPY;  Surgeon: Christena Deem, MD;  Location: [facility] ENDOSCOPY;  Service: Endoscopy;  Laterality: N/A;   COLONOSCOPY WITH PROPOFOL N/A 11/21/2020   Procedure: COLONOSCOPY WITH PROPOFOL;  Surgeon: Wyline Mood, MD;  Location: [facility] ENDOSCOPY;  Service: Gastroenterology;  Laterality: N/A;   EXCISION MASS UPPER EXTREMETIES Right 08/09/2021   Procedure: Excision of benign neoplasm right index PIP joint and repair of extensor tendon;  Surgeon: Christena Flake, MD;  Location: [facility] ORS;  Service: Orthopedics;  Laterality: Right;    MASS EXCISION Right 03/19/2023   Procedure: EXCISION OF RECURRENT SOFT TISSUE MASS OF RIGHT INDEX FINGER;  Surgeon: Christena Flake, MD;  Location: [facility] ORS;  Service: Orthopedics;  Laterality: Right;   ORIF ANKLE FRACTURE Right 2006   2 screws and pin   TOTAL KNEE ARTHROPLASTY Left 05/16/2023   Procedure: TOTAL KNEE ARTHROPLASTY;  Surgeon: Christena Flake, MD;  Location: [facility] ORS;  Service: Orthopedics;  Laterality: Left;   Patient Active Problem List   Diagnosis Date Noted   Status post total knee replacement using cement, left 05/16/2023   Diverticulitis of large intestine with abscess without bleeding 10/29/2022   Diverticulitis 10/26/2022   Insomnia 10/26/2022   Abdominal pain 10/26/2022   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  Otherwise, pt is doing well and has no complaints other than the expected stiffness.   PERTINENT HISTORY: 72yoF referred for OPPT s/p Left TKA 10/24. Pt DC to home on POD1, then took HHPT for 2 weeks. Pt reports successful compliance and tolerance with HEP performance, achieved up to 96 degrees knee flexion at DC from HHPT. Pt reports knee well controlled overall, swelling moderate. Pt plans to return to work at Jan 13 at the latest. Pt transitioned from RW to [facility] ~2 weeks postop.  PAIN:  Are you having pain? 3/10 anterior knee pain   PRECAUTIONS:  Knee  WEIGHT BEARING RESTRICTIONS: WBAT   FALLS:  Has patient fallen in last 6 months? None   LIVING ENVIRONMENT: Lives with: Husband  Lives in: [place], 1 level   Stairs: 3 steps, 1 railing  Has following equipment at home: RW, [address] [facility]    OCCUPATION: Works for [employer], planning to return in January if possible; works 20 hours weekly, some sitting, some time on feet   PLOF: unlimited walking without device   PATIENT GOALS: return to prior level   NEXT MD VISIT: FU in several weeks   OBJECTIVE:  Note: Objective measures were completed at Evaluation unless otherwise noted.  PATIENT SURVEYS:  FOTO: 47    LOWER EXTREMITY ROM:  Active ROM Left eval  Knee flexion 100  Knee extension 14   (Blank rows = not tested)  06/18/23: Flexion: 104 deg; Extension: 8 deg 06/25/23: Flexion: 120 deg; Extension: 7 deg 07/01/23: Flexion: 111 deg AROM; 7 deg AROM    FUNCTIONAL TESTS:  -344ft AMB: 11sec, SPC RUE (0.82m/s)  -minimal antalgia, minimal asymmetry   TODAY'S TREATMENT: DATE: 07/01/23   TherEx:  Seated matrix biking, L1 for 5 minutes, 2.5 min forward/2.5 min backward, for increased ROM and reduced swelling of the knee  Squats on total gym, 2x10 Seated on stool rolling in gym, 22m x 4 laps for increased hamstring strengthening Seated leg press, 35# x10, 55# x10 Seated knee extension, 20#, 2x10 Seated hamstring curls, 20#, 2x10  Standing forward lunges with UE support at stairs, second step, 2x10 Standing hamstring stretch with L LE on third step, 30 sec bouts x2    PATIENT EDUCATION:  Education details: HEP updates, ROM focus Person educated: Patient  Education method: 1:1 conversation  Education comprehension: 90%   HOME EXERCISE PROGRAM: Access Code: [email or web address] URL: [email or web address] Date: 06/04/2023 Prepared by: Alvera Novel  Exercises - Seated Knee Extension Stretch with Chair  - 2 x daily - 7 x weekly - 1 sets - 10 reps - 10 hold -  Walk 3x daily for 2 minutes   (Carryovers from HPT continued as follows: supine hip ABDCT/ADD, mini squats, standing hip ABDCT bilat, standing heel raises x10)   ASSESSMENT:  CLINICAL IMPRESSION:  Pt responded well to the therapeutic exercises and put forth great effort throughout the session.  Pt continues to make improvements in AROM as noted above.  Pt currently demonstrating reduced knee flexion, however the flexion measurements prior were PROM with overpressure applied.  Pt encouraged by mobility and will continue to benefit from skilled therapy to address any remaining deficits.        OBJECTIVE IMPAIRMENTS: Abnormal gait, decreased activity tolerance, decreased balance, decreased endurance, decreased knowledge of use of DME, decreased mobility, difficulty walking, decreased ROM, decreased strength, hypomobility, increased fascial restrictions, increased muscle spasms, and impaired flexibility.   ACTIVITY LIMITATIONS: carrying, lifting, standing, squatting, stairs, transfers, bed mobility,  and locomotion level  PARTICIPATION LIMITATIONS: meal prep, cleaning, laundry, medication management, interpersonal relationship, driving, shopping, community activity, occupation, and yard work  PERSONAL FACTORS: Education, Financial risk analyst, Past/current experiences, Profession, Social background, and Transportation are also affecting patient's functional outcome.   REHAB POTENTIAL: Excellent  CLINICAL DECISION MAKING: Stable/uncomplicated  EVALUATION COMPLEXITY: Moderate   GOALS: Goals reviewed with patient? No  LONG TERM GOALS: Target date: 07/16/23  FOTO survey score increase by >14 points.  Baseline: 47 Goal status: INITIAL  2.  5xSTS hands free, symmetrical footing x11 seconds or even less.  Baseline:  17.91 sec Goal status: INITIAL  3.  Left knee ROM extension <10 and flexion >125 to facilitate ability to sit in low sports car bucket seats Baseline: L Knee Extension: 14 deg lacking; L  Knee Flexion: 100 deg Goal status: INITIAL  4.  faster than 1.39m/s to facilitate optimal ergonomics for acessing the community for IADL  Baseline: 0.95 m/s Goal status: INITIAL  5.  >165ft without increase in pain >2 points and without any episodes of knee buckling.  Baseline: 340ft AMB at evaluation  Goal status: INITIAL  6.  SLS >15sec bilateral  Baseline: defer to later visits  Goal status: INITIAL   PLAN:  PT FREQUENCY: 1-2x/week  PT DURATION: 6 weeks  PLANNED INTERVENTIONS: 97110-Therapeutic exercises, 97530- Therapeutic activity, [id number]- Neuromuscular re-education, 97535- Self Care, 16109- Manual therapy, [id number]- Gait training, [phone]- Prosthetic training, [phone]- Electrical stimulation (unattended), [phone]- Electrical stimulation (manual), Patient/Family education, Balance training, Stair training, and Dry Needling  PLAN FOR NEXT SESSION:  continue strengthening LE's and improve terminal knee extension.    Nolon Bussing, PT, DPT Physical Therapist - [facility]  07/01/23, 4:21 PM

## 2023-07-02 ENCOUNTER — Telehealth: Payer: Self-pay | Admitting: Gastroenterology

## 2023-07-02 NOTE — Telephone Encounter (Signed)
Patient called in she needs to reschedule her procedure because she has knee replacement surgery.

## 2023-07-02 NOTE — Telephone Encounter (Signed)
Called patient back, please read message from 07/01/2023.

## 2023-07-02 NOTE — Telephone Encounter (Signed)
Called patient back and she stated that she wanted to reschedule her procedure since she just had knee surgery. Therefore, she chose to reschedule it until 08/26/2023. I then called the endoscopy unit and spoke with [place] and let her know about the change.

## 2023-07-03 ENCOUNTER — Ambulatory Visit: Payer: Medicare PPO

## 2023-07-03 DIAGNOSIS — M25662 Stiffness of left knee, not elsewhere classified: Secondary | ICD-10-CM

## 2023-07-03 DIAGNOSIS — R262 Difficulty in walking, not elsewhere classified: Secondary | ICD-10-CM

## 2023-07-03 NOTE — Therapy (Signed)
OUTPATIENT PHYSICAL THERAPY TREATMENT   Patient Name: Patricia Oliver MRN: [id number] DOB:[birth year], 72 y.o., female Today's Date: 07/03/2023   END OF SESSION:   PT End of Session - 07/03/23 1434     Visit Number 7    Number of Visits 12    Date for PT Re-Evaluation 07/16/23    Authorization Type [employer]    Authorization Time Period 06/04/23-07/16/23    Progress Note Due on Visit 10    PT Start Time 1431    PT Stop Time 1515    PT Time Calculation (min) 44 min    Activity Tolerance Patient tolerated treatment well;No increased pain    Behavior During Therapy WFL for tasks assessed/performed              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, right    Cataract cortical, senile, left    Colon polyp    COVID-19 02/26/2023   Diverticulitis 10/2022   Diverticulosis    Fibrocystic breast disease    GERD (gastroesophageal reflux disease)    Hyperlipidemia    Insomnia    Osteoarthritis of left knee    Osteopenia    Past Surgical History:  Procedure Laterality Date   ABDOMINAL HYSTERECTOMY  2001   APPENDECTOMY  1956   BREAST BIOPSY Left 1996   neg   BREAST BIOPSY Left 04/06/2021   stereo bx, ribbon clip, neg   CATARACT EXTRACTION W/ INTRAOCULAR LENS IMPLANT Left 01/2023   CATARACT EXTRACTION W/ INTRAOCULAR LENS IMPLANT Right 03/2023   COLONOSCOPY N/A 12/10/2014   Procedure: COLONOSCOPY;  Surgeon: Christena Deem, MD;  Location: [facility] ENDOSCOPY;  Service: Endoscopy;  Laterality: N/A;   COLONOSCOPY WITH PROPOFOL N/A 11/21/2020   Procedure: COLONOSCOPY WITH PROPOFOL;  Surgeon: Wyline Mood, MD;  Location: [facility] ENDOSCOPY;  Service: Gastroenterology;  Laterality: N/A;   EXCISION MASS UPPER EXTREMETIES Right 08/09/2021   Procedure: Excision of benign neoplasm right index PIP joint and repair of extensor tendon;  Surgeon: Christena Flake, MD;  Location: [facility] ORS;  Service: Orthopedics;  Laterality:  Right;   MASS EXCISION Right 03/19/2023   Procedure: EXCISION OF RECURRENT SOFT TISSUE MASS OF RIGHT INDEX FINGER;  Surgeon: Christena Flake, MD;  Location: [facility] ORS;  Service: Orthopedics;  Laterality: Right;   ORIF ANKLE FRACTURE Right 2006   2 screws and pin   TOTAL KNEE ARTHROPLASTY Left 05/16/2023   Procedure: TOTAL KNEE ARTHROPLASTY;  Surgeon: Christena Flake, MD;  Location: [facility] ORS;  Service: Orthopedics;  Laterality: Left;   Patient Active Problem List   Diagnosis Date Noted   Status post total knee replacement using cement, left 05/16/2023   Diverticulitis of large intestine with abscess without bleeding 10/29/2022   Diverticulitis 10/26/2022   Insomnia 10/26/2022   Abdominal pain 10/26/2022   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, right 07/21/2021   Change in bowel habits 10/26/2020   Primary osteoarthritis of left knee 04/30/2018   Allergic rhinitis 11/19/2013   Esophageal reflux 11/19/2013   HLD (hyperlipidemia) 11/19/2013    PCP: Kandyce Rud, MD   REFERRING PROVIDER: Gayla Doss, MD   REFERRING DIAG:   THERAPY DIAG:  Stiffness of left knee, not elsewhere classified  Difficulty in walking, not elsewhere classified  Rationale for Evaluation and Treatment: Rehabilitation  ONSET DATE: 05/16/23  SUBJECTIVE:   SUBJECTIVE STATEMENT:  Pt reports the visit with the other PT went well and was  able to get some exercises for the piriformis pain that she has been having.  Pt notes that she is paying a copay and filing insurance with them as well.  Pt advised that it may be conflicting with this therapy session due to filing insurances.   PERTINENT HISTORY: 72yoF referred for OPPT s/p Left TKA 10/24. Pt DC to home on POD1, then took HHPT for 2 weeks. Pt reports successful compliance and tolerance with HEP performance, achieved up to 96 degrees knee flexion at DC from HHPT. Pt reports knee well controlled overall, swelling moderate. Pt plans to return to work at  Jan 13 at the latest. Pt transitioned from RW to [facility] ~2 weeks postop.    PAIN:  Are you having pain? 3/10 anterior knee pain   PRECAUTIONS: Knee  WEIGHT BEARING RESTRICTIONS: WBAT   FALLS:  Has patient fallen in last 6 months? None   LIVING ENVIRONMENT: Lives with: Husband  Lives in: [place], 1 level   Stairs: 3 steps, 1 railing  Has following equipment at home: RW, [facility]    OCCUPATION: Works for [employer], planning to return in January if possible; works 20 hours weekly, some sitting, some time on feet   PLOF: unlimited walking without device   PATIENT GOALS: return to prior level   NEXT MD VISIT: FU in several weeks   OBJECTIVE:  Note: Objective measures were completed at Evaluation unless otherwise noted.  PATIENT SURVEYS:  FOTO: 47    LOWER EXTREMITY ROM:  Active ROM Left eval  Knee flexion 100  Knee extension 14   (Blank rows = not tested)  06/18/23: Flexion: 104 deg; Extension: 8 deg 06/25/23: Flexion: 120 deg; Extension: 7 deg 07/01/23: Flexion: 111 deg AROM; 7 deg AROM 07/03/23: Flexion: 121 deg AROM; 7 deg AROM  FUNCTIONAL TESTS:  -332ft AMB: 11sec, SPC RUE (0.72m/s)  -minimal antalgia, minimal asymmetry   TODAY'S TREATMENT: DATE: 07/03/23   TherEx:  Seated matrix biking, L1 for 5 minutes, 2.5 min forward/2.5 min backward, for increased ROM and reduced swelling of the knee  Prone knee hangs with 4# AW's, 2 minute bouts x4 Prone hip extension with 4# AW's, 2x10 Prone hamstring curls with 4# AW's, 2x10 Prone PROM with overpressure into knee flexion, 30 sec holds x4  Sled push with 50# weight added, x2 laps with focus on lowering center of mass and utilizing the LE's to push  Standing forward lunges with UE support at stairs, second step, 2x10 Standing hamstring stretch with L LE on third step, 30 sec bouts x2    PATIENT EDUCATION:  Education details: HEP updates, ROM focus Person educated: Patient  Education method: 1:1 conversation   Education comprehension: 90%   HOME EXERCISE PROGRAM: Access Code: [email or web address] URL: [email or web address] Date: 06/04/2023 Prepared by: Alvera Novel  Exercises - Seated Knee Extension Stretch with Chair  - 2 x daily - 7 x weekly - 1 sets - 10 reps - 10 hold - Walk 3x daily for 2 minutes   (Carryovers from HPT continued as follows: supine hip ABDCT/ADD, mini squats, standing hip ABDCT bilat, standing heel raises x10)   ASSESSMENT:  CLINICAL IMPRESSION:  Pt continues to put forth great effort throughout the session and is making consistent improvement in strength.  Pt still has lacking ROM at this time, which has continued to be focal point in therapy and HEP.  Pt encouraged to continue to perform at home in order to improve and increase the ROM.   Pt will continue  to benefit from skilled therapy to address remaining deficits in order to improve overall QoL and return to PLOF.         OBJECTIVE IMPAIRMENTS: Abnormal gait, decreased activity tolerance, decreased balance, decreased endurance, decreased knowledge of use of DME, decreased mobility, difficulty walking, decreased ROM, decreased strength, hypomobility, increased fascial restrictions, increased muscle spasms, and impaired flexibility.   ACTIVITY LIMITATIONS: carrying, lifting, standing, squatting, stairs, transfers, bed mobility, and locomotion level  PARTICIPATION LIMITATIONS: meal prep, cleaning, laundry, medication management, interpersonal relationship, driving, shopping, community activity, occupation, and yard work  PERSONAL FACTORS: Education, Financial risk analyst, Past/current experiences, Profession, Social background, and Transportation are also affecting patient's functional outcome.   REHAB POTENTIAL: Excellent  CLINICAL DECISION MAKING: Stable/uncomplicated  EVALUATION COMPLEXITY: Moderate   GOALS: Goals reviewed with patient? No  LONG TERM GOALS: Target date: 07/16/23  FOTO survey score increase by  >14 points.  Baseline: 47 Goal status: INITIAL  2.  5xSTS hands free, symmetrical footing x11 seconds or even less.  Baseline:  17.91 sec Goal status: INITIAL  3.  Left knee ROM extension <10 and flexion >125 to facilitate ability to sit in low sports car bucket seats Baseline: L Knee Extension: 14 deg lacking; L Knee Flexion: 100 deg Goal status: INITIAL  4.  faster than 1.57m/s to facilitate optimal ergonomics for acessing the community for IADL  Baseline: 0.95 m/s Goal status: INITIAL  5.  >1650ft without increase in pain >2 points and without any episodes of knee buckling.  Baseline: 373ft AMB at evaluation  Goal status: INITIAL  6.  SLS >15sec bilateral  Baseline: defer to later visits  Goal status: INITIAL   PLAN:  PT FREQUENCY: 1-2x/week  PT DURATION: 6 weeks  PLANNED INTERVENTIONS: 97110-Therapeutic exercises, 97530- Therapeutic activity, [id number]- Neuromuscular re-education, 97535- Self Care, 16109- Manual therapy, [id number]- Gait training, [phone]- Prosthetic training, [phone]- Electrical stimulation (unattended), [phone]- Electrical stimulation (manual), Patient/Family education, Balance training, Stair training, and Dry Needling  PLAN FOR NEXT SESSION:   continue strengthening LE's and improve terminal knee extension.  Pt also still lacking full active knee flexion and would benefit from continued sustained stretches in order to improve tolerance.    Nolon Bussing, PT, DPT Physical Therapist - [facility]  07/03/23, 4:59 PM

## 2023-07-04 ENCOUNTER — Ambulatory Visit
Admission: RE | Admit: 2023-07-04 | Discharge: 2023-07-04 | Disposition: A | Discharge status: Home / Self Care | Payer: Medicare PPO | Source: Ambulatory Visit | Attending: Family Medicine | Admitting: Family Medicine

## 2023-07-04 DIAGNOSIS — Z1231 Encounter for screening mammogram for malignant neoplasm of breast: Secondary | ICD-10-CM | POA: Insufficient documentation

## 2023-07-08 ENCOUNTER — Ambulatory Visit: Payer: Medicare PPO

## 2023-07-08 DIAGNOSIS — M25662 Stiffness of left knee, not elsewhere classified: Secondary | ICD-10-CM

## 2023-07-08 DIAGNOSIS — R262 Difficulty in walking, not elsewhere classified: Secondary | ICD-10-CM

## 2023-07-08 NOTE — Therapy (Signed)
OUTPATIENT PHYSICAL THERAPY TREATMENT   Patient Name: Patricia Oliver MRN: [id number] DOB:01-16-1951, 72 y.o., female Today's Date: 07/08/2023   END OF SESSION:   PT End of Session - 07/08/23 1442     Visit Number 8    Number of Visits 12    Date for PT Re-Evaluation 07/16/23    Authorization Type [employer]    Authorization Time Period 06/04/23-07/16/23    Progress Note Due on Visit 10    PT Start Time 1441    PT Stop Time 1520    PT Time Calculation (min) 39 min    Activity Tolerance Patient tolerated treatment well;No increased pain    Behavior During Therapy WFL for tasks assessed/performed             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, right    Cataract cortical, senile, left    Colon polyp    COVID-19 02/26/2023   Diverticulitis 10/2022   Diverticulosis    Fibrocystic breast disease    GERD (gastroesophageal reflux disease)    Hyperlipidemia    Insomnia    Osteoarthritis of left knee    Osteopenia    Past Surgical History:  Procedure Laterality Date   ABDOMINAL HYSTERECTOMY  2001   APPENDECTOMY  1956   BREAST BIOPSY Left 1996   neg   BREAST BIOPSY Left 04/06/2021   stereo bx, ribbon clip, neg   CATARACT EXTRACTION W/ INTRAOCULAR LENS IMPLANT Left 01/2023   CATARACT EXTRACTION W/ INTRAOCULAR LENS IMPLANT Right 03/2023   COLONOSCOPY N/A 12/10/2014   Procedure: COLONOSCOPY;  Surgeon: Christena Deem, MD;  Location: [facility] ENDOSCOPY;  Service: Endoscopy;  Laterality: N/A;   COLONOSCOPY WITH PROPOFOL N/A 11/21/2020   Procedure: COLONOSCOPY WITH PROPOFOL;  Surgeon: Wyline Mood, MD;  Location: [facility] ENDOSCOPY;  Service: Gastroenterology;  Laterality: N/A;   EXCISION MASS UPPER EXTREMETIES Right 08/09/2021   Procedure: Excision of benign neoplasm right index PIP joint and repair of extensor tendon;  Surgeon: Christena Flake, MD;  Location: [facility] ORS;  Service: Orthopedics;  Laterality: Right;    MASS EXCISION Right 03/19/2023   Procedure: EXCISION OF RECURRENT SOFT TISSUE MASS OF RIGHT INDEX FINGER;  Surgeon: Christena Flake, MD;  Location: [facility] ORS;  Service: Orthopedics;  Laterality: Right;   ORIF ANKLE FRACTURE Right 2006   2 screws and pin   TOTAL KNEE ARTHROPLASTY Left 05/16/2023   Procedure: TOTAL KNEE ARTHROPLASTY;  Surgeon: Christena Flake, MD;  Location: [facility] ORS;  Service: Orthopedics;  Laterality: Left;   Patient Active Problem List   Diagnosis Date Noted   Status post total knee replacement using cement, left 05/16/2023   Diverticulitis of large intestine with abscess without bleeding 10/29/2022   Diverticulitis 10/26/2022   Insomnia 10/26/2022   Abdominal pain 10/26/2022   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, right 07/21/2021   Change in bowel habits 10/26/2020   Primary osteoarthritis of left knee 04/30/2018   Allergic rhinitis 11/19/2013   Esophageal reflux 11/19/2013   HLD (hyperlipidemia) 11/19/2013    PCP: Kandyce Rud, MD   REFERRING PROVIDER: Gayla Doss, MD   REFERRING DIAG:   THERAPY DIAG:  Stiffness of left knee, not elsewhere classified  Difficulty in walking, not elsewhere classified  Rationale for Evaluation and Treatment: Rehabilitation  ONSET DATE: 05/16/23  SUBJECTIVE:   SUBJECTIVE STATEMENT:   Patient reports she is doing well and had a good weekend  PERTINENT HISTORY: 72yoF referred for OPPT s/p Left TKA 10/24. Pt DC to home on POD1, then took HHPT for 2 weeks. Pt reports successful compliance and tolerance with HEP performance, achieved up to 96 degrees knee flexion at DC from HHPT. Pt reports knee well controlled overall, swelling moderate. Pt plans to return to work at Jan 13 at the latest. Pt transitioned from RW to [facility] ~2 weeks postop.    PAIN:  Are you having pain? 3/10 anterior knee pain   PRECAUTIONS: Knee  WEIGHT BEARING RESTRICTIONS: WBAT   FALLS:  Has patient fallen in last 6 months? None   LIVING  ENVIRONMENT: Lives with: Husband  Lives in: [place], 1 level   Stairs: 3 steps, 1 railing  Has following equipment at home: RW, [facility]    OCCUPATION: Works for [employer], planning to return in January if possible; works 20 hours weekly, some sitting, some time on feet   PLOF: unlimited walking without device   PATIENT GOALS: return to prior level   NEXT MD VISIT: FU in several weeks   OBJECTIVE:  Note: Objective measures were completed at Evaluation unless otherwise noted.  PATIENT SURVEYS:  FOTO: 47    LOWER EXTREMITY ROM:  Active ROM Left eval  Knee flexion 100  Knee extension 14   (Blank rows = not tested)  06/18/23: Flexion: 104 deg; Extension: 8 deg 06/25/23: Flexion: 120 deg; Extension: 7 deg 07/01/23: Flexion: 111 deg AROM; 7 deg AROM 07/03/23: Flexion: 121 deg AROM; 7 deg AROM 07/08/23: Flexion: 122 deg AROM; 7 deg AROM   FUNCTIONAL TESTS:  -323ft AMB: 11sec, SPC RUE (0.110m/s)  -minimal antalgia, minimal asymmetry   TODAY'S TREATMENT: DATE: 07/08/23    TherEx:  Seated matrix biking, L2 for 5 minutes for increased ROM and reduced swelling of the knee  Standing knee flexion stretch on 2nd step x 10 with 10 second holds  Standing TKE with GTB anchored at steps 2 x 10   Prone knee hangs with 4# AW's, 2 minute bouts x3 Prone hamstring curls with 4# AW's, 2x10 Seated LAQ with 4# AW 2 x 10    See above for measurements taken this date for L knee   PATIENT EDUCATION:  Education details: HEP updates, ROM focus Person educated: Patient  Education method: 1:1 conversation  Education comprehension: 90%   HOME EXERCISE PROGRAM: Access Code: [email or web address] URL: [email or web address] Date: 06/04/2023 Prepared by: Alvera Novel  Exercises - Seated Knee Extension Stretch with Chair  - 2 x daily - 7 x weekly - 1 sets - 10 reps - 10 hold - Walk 3x daily for 2 minutes   (Carryovers from HPT continued as follows: supine hip ABDCT/ADD, mini squats,  standing hip ABDCT bilat, standing heel raises x10)   ASSESSMENT:  CLINICAL IMPRESSION:   Patient arrives to treatment session motivated to participate. Continues to be most limited in L knee extension ROM with lacking 7 degrees from zero. Continued to encourage hamstring stretch and weight knee extension at home. Patient will continue to benefit from skilled therapy to address remaining deficits in order to improve quality of life and return to PLOF.     OBJECTIVE IMPAIRMENTS: Abnormal gait, decreased activity tolerance, decreased balance, decreased endurance, decreased knowledge of use of DME, decreased mobility, difficulty walking, decreased ROM, decreased strength, hypomobility, increased fascial restrictions, increased muscle spasms, and impaired flexibility.   ACTIVITY LIMITATIONS: carrying, lifting, standing, squatting, stairs, transfers, bed mobility, and locomotion level  PARTICIPATION LIMITATIONS: meal prep, cleaning, laundry, medication  management, interpersonal relationship, driving, shopping, community activity, occupation, and yard work  PERSONAL FACTORS: Education, Financial risk analyst, Past/current experiences, Profession, Social background, and Transportation are also affecting patient's functional outcome.   REHAB POTENTIAL: Excellent  CLINICAL DECISION MAKING: Stable/uncomplicated  EVALUATION COMPLEXITY: Moderate   GOALS: Goals reviewed with patient? No  LONG TERM GOALS: Target date: 07/16/23  FOTO survey score increase by >14 points.  Baseline: 47 Goal status: INITIAL  2.  5xSTS hands free, symmetrical footing x11 seconds or even less.  Baseline:  17.91 sec Goal status: INITIAL  3.  Left knee ROM extension <10 and flexion >125 to facilitate ability to sit in low sports car bucket seats Baseline: L Knee Extension: 14 deg lacking; L Knee Flexion: 100 deg Goal status: INITIAL  4.  faster than 1.23m/s to facilitate optimal ergonomics for acessing the community for IADL   Baseline: 0.95 m/s Goal status: INITIAL  5.  >1661ft without increase in pain >2 points and without any episodes of knee buckling.  Baseline: 367ft AMB at evaluation  Goal status: INITIAL  6.  SLS >15sec bilateral  Baseline: defer to later visits  Goal status: INITIAL   PLAN:  PT FREQUENCY: 1-2x/week  PT DURATION: 6 weeks  PLANNED INTERVENTIONS: 97110-Therapeutic exercises, 97530- Therapeutic activity, [id number]- Neuromuscular re-education, 97535- Self Care, [id number]- Manual therapy, [id number]- Gait training, [phone]- Prosthetic training, [phone]- Electrical stimulation (unattended), [phone]- Electrical stimulation (manual), Patient/Family education, Balance training, Stair training, and Dry Needling  PLAN FOR NEXT SESSION:   continue strengthening LE's and improve terminal knee extension.  Pt also still lacking full active knee flexion and would benefit from continued sustained stretches in order to improve tolerance.   Maylon Peppers, PT, DPT Physical Therapist - [facility] 07/08/23, 3:24 PM

## 2023-07-10 ENCOUNTER — Ambulatory Visit: Payer: Medicare PPO

## 2023-07-10 DIAGNOSIS — R262 Difficulty in walking, not elsewhere classified: Secondary | ICD-10-CM

## 2023-07-10 DIAGNOSIS — M25662 Stiffness of left knee, not elsewhere classified: Secondary | ICD-10-CM

## 2023-07-10 NOTE — Therapy (Signed)
OUTPATIENT PHYSICAL THERAPY TREATMENT/RE-CERT   Patient Name: Patricia Oliver MRN: [id number] DOB:1950-10-03, 72 y.o., female Today's Date: 07/10/2023   END OF SESSION:   PT End of Session - 07/10/23 1301     Visit Number 9    Number of Visits 22    Date for PT Re-Evaluation 08/07/23    Authorization Type [employer]    Authorization Time Period 06/04/23-08/23/23    Progress Note Due on Visit 10    PT Start Time 1301    PT Stop Time 1345    PT Time Calculation (min) 44 min    Activity Tolerance Patient tolerated treatment well;No increased pain    Behavior During Therapy WFL for tasks assessed/performed             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, right    Cataract cortical, senile, left    Colon polyp    COVID-19 02/26/2023   Diverticulitis 10/2022   Diverticulosis    Fibrocystic breast disease    GERD (gastroesophageal reflux disease)    Hyperlipidemia    Insomnia    Osteoarthritis of left knee    Osteopenia    Past Surgical History:  Procedure Laterality Date   ABDOMINAL HYSTERECTOMY  2001   APPENDECTOMY  1956   BREAST BIOPSY Left 1996   neg   BREAST BIOPSY Left 04/06/2021   stereo bx, ribbon clip, neg   CATARACT EXTRACTION W/ INTRAOCULAR LENS IMPLANT Left 01/2023   CATARACT EXTRACTION W/ INTRAOCULAR LENS IMPLANT Right 03/2023   COLONOSCOPY N/A 12/10/2014   Procedure: COLONOSCOPY;  Surgeon: Christena Deem, MD;  Location: [facility] ENDOSCOPY;  Service: Endoscopy;  Laterality: N/A;   COLONOSCOPY WITH PROPOFOL N/A 11/21/2020   Procedure: COLONOSCOPY WITH PROPOFOL;  Surgeon: Wyline Mood, MD;  Location: [facility] ENDOSCOPY;  Service: Gastroenterology;  Laterality: N/A;   EXCISION MASS UPPER EXTREMETIES Right 08/09/2021   Procedure: Excision of benign neoplasm right index PIP joint and repair of extensor tendon;  Surgeon: Christena Flake, MD;  Location: [facility] ORS;  Service: Orthopedics;  Laterality:  Right;   MASS EXCISION Right 03/19/2023   Procedure: EXCISION OF RECURRENT SOFT TISSUE MASS OF RIGHT INDEX FINGER;  Surgeon: Christena Flake, MD;  Location: [facility] ORS;  Service: Orthopedics;  Laterality: Right;   ORIF ANKLE FRACTURE Right 2006   2 screws and pin   TOTAL KNEE ARTHROPLASTY Left 05/16/2023   Procedure: TOTAL KNEE ARTHROPLASTY;  Surgeon: Christena Flake, MD;  Location: [facility] ORS;  Service: Orthopedics;  Laterality: Left;   Patient Active Problem List   Diagnosis Date Noted   Status post total knee replacement using cement, left 05/16/2023   Diverticulitis of large intestine with abscess without bleeding 10/29/2022   Diverticulitis 10/26/2022   Insomnia 10/26/2022   Abdominal pain 10/26/2022   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  Pt notes some stiffness, maybe  2/10.  Pt also notes that she has dimples in the L knee which she attributes to the swelling going down.  PERTINENT HISTORY: 72yoF referred for OPPT s/p Left TKA 10/24. Pt DC to home on POD1, then took HHPT for 2 weeks. Pt reports successful compliance and tolerance with HEP performance, achieved up to 96 degrees knee flexion at DC from HHPT. Pt reports knee well controlled overall, swelling moderate. Pt plans to return to work at Jan 13 at the latest. Pt transitioned from RW to [facility] ~2 weeks postop.    PAIN:  Are you having pain? 3/10 anterior knee pain    PRECAUTIONS: Knee  WEIGHT BEARING RESTRICTIONS: WBAT   FALLS:  Has patient fallen in last 6 months? None   LIVING ENVIRONMENT: Lives with: Husband  Lives in: [place], 1 level   Stairs: 3 steps, 1 railing  Has following equipment at home: RW, [facility]    OCCUPATION: Works for [employer], planning to return in January if possible; works 20 hours weekly, some sitting, some time on feet   PLOF: unlimited walking without device   PATIENT GOALS: return to prior level   NEXT MD VISIT: FU in several weeks   OBJECTIVE:  Note: Objective measures were completed at Evaluation unless otherwise noted.  PATIENT SURVEYS:  FOTO: 47    LOWER EXTREMITY ROM:  Active ROM Left eval  Knee flexion 100  Knee extension 14   (Blank rows = not tested)  06/18/23: Flexion: 104 deg; Extension: 8 deg 06/25/23: Flexion: 120 deg; Extension: 7 deg 07/01/23: Flexion: 111 deg AROM; 7 deg AROM 07/03/23: Flexion: 121 deg AROM; 7 deg AROM 07/08/23: Flexion: 122 deg AROM; 7 deg AROM   FUNCTIONAL TESTS:  -326ft AMB: 11sec, SPC RUE (0.59m/s)  -minimal antalgia, minimal asymmetry   TODAY'S TREATMENT: DATE: 07/10/23    TherEx:  Seated matrix biking, L2 for 5 minutes for increased ROM and reduced swelling of the knee  TherAct:  Goal assessment performed and noted below:   PATIENT EDUCATION:  Education details: HEP updates, ROM focus Person educated: Patient  Education method: 1:1 conversation  Education comprehension: 90%   HOME EXERCISE PROGRAM: Access Code: [email or web address] URL: [email or web address] Date: 06/04/2023 Prepared by: Alvera Novel  Exercises - Seated Knee Extension Stretch with Chair  - 2 x daily - 7 x weekly - 1 sets - 10 reps - 10 hold - Walk 3x daily for 2 minutes   (Carryovers from HPT continued as follows: supine hip ABDCT/ADD, mini squats, standing hip ABDCT bilat, standing heel raises x10)    ASSESSMENT:  CLINICAL IMPRESSION:  Pt has made considerable  improved with goals as noted below.  Pt has been able to achieve [facility] ROM of the L knee and continued to improve with strength as well.  Pt encouraged to continue current HEP at this time in order to keep ROM.  Pt will be assessed at next visit for progress report, however goals and re-certification has officially been completed.  Patient's condition has the potential to improve in response to therapy. Maximum improvement is yet to be obtained. The anticipated improvement is attainable and reasonable in a generally predictable time.   Pt will continue to benefit from skilled therapy to address remaining deficits in order to improve overall QoL and return to PLOF.      OBJECTIVE IMPAIRMENTS: Abnormal gait, decreased activity tolerance, decreased balance, decreased endurance, decreased knowledge of use of DME, decreased mobility, difficulty walking, decreased ROM, decreased strength, hypomobility, increased fascial restrictions,  increased muscle spasms, and impaired flexibility.   ACTIVITY LIMITATIONS: carrying, lifting, standing, squatting, stairs, transfers, bed mobility, and locomotion level  PARTICIPATION LIMITATIONS: meal prep, cleaning, laundry, medication management, interpersonal relationship, driving, shopping, community activity, occupation, and yard work  PERSONAL FACTORS: Education, Financial risk analyst, Past/current experiences, Profession, Social background, and Transportation are also affecting patient's functional outcome.   REHAB POTENTIAL: Excellent  CLINICAL DECISION MAKING: Stable/uncomplicated  EVALUATION COMPLEXITY: Moderate   GOALS: Goals reviewed with patient? No  LONG TERM GOALS: Target date: 07/16/23  FOTO survey score increase by >14 points.  Baseline: 47 07/10/23: FOTO: 71 Goal status: MET  2.  5xSTS hands free, symmetrical footing x11 seconds or even less.  Baseline:  17.91 sec 07/10/23: 9.82 sec with slight push off on knees Goal status: MET  3.  Left knee ROM  extension <10 and flexion >125 to facilitate ability to sit in low sports car bucket seats Baseline: L Knee Extension: 14 deg lacking; L Knee Flexion: 100 deg 07/10/23: L Knee Extension: 4 deg lacking; L Knee Flexion: 126 deg Goal status: PROGRESSING  4.  faster than 1.55m/s to facilitate optimal ergonomics for acessing the community for IADL  Baseline: 0.95 m/s 07/10/23: 1.17 m/s Goal status: MET  5.  >1674ft without increase in pain >2 points and without any episodes of knee buckling.  Baseline: 3109ft AMB at evaluation  07/10/23: 1427 ft with 2/10 pain Goal status: PROGRESSING  6.  SLS >15sec bilateral  Baseline: defer to later visits  07/10/23: L: 10 sec; R: 4 sec  Goal status: PROGRESSING   PLAN:  PT FREQUENCY: 1-2x/week  PT DURATION: 6 weeks  PLANNED INTERVENTIONS: 97110-Therapeutic exercises, 97530- Therapeutic activity, 97112- Neuromuscular re-education, 97535- Self Care, [id number]- Manual therapy, [id number]- Gait training, [phone]- Prosthetic training, [phone]- Electrical stimulation (unattended), [phone]- Electrical stimulation (manual), Patient/Family education, Balance training, Stair training, and Dry Needling  PLAN FOR NEXT SESSION:   continue strengthening LE's and improve terminal knee extension.  Pt also still lacking full active knee flexion and would benefit from continued sustained stretches in order to improve tolerance.   Nolon Bussing, PT, DPT Physical Therapist - [facility]  07/10/23, 1:04 PM

## 2023-07-11 ENCOUNTER — Ambulatory Visit: Payer: Medicare PPO

## 2023-07-22 ENCOUNTER — Ambulatory Visit: Payer: Medicare PPO

## 2023-07-22 DIAGNOSIS — M25662 Stiffness of left knee, not elsewhere classified: Secondary | ICD-10-CM

## 2023-07-22 DIAGNOSIS — R262 Difficulty in walking, not elsewhere classified: Secondary | ICD-10-CM

## 2023-07-22 NOTE — Therapy (Addendum)
OUTPATIENT PHYSICAL THERAPY TREATMENT And Progress Report (06/04/2023 - 07/22/2023)   Patient Name: Patricia Oliver MRN: [id number] DOB:1951-02-09, 72 y.o., female Today's Date: 07/22/2023   END OF SESSION:   PT End of Session - 07/22/23 1302     Visit Number 10    Number of Visits 22    Date for PT Re-Evaluation 08/07/23    Authorization Type [employer]    Authorization Time Period 06/04/23-08/23/23    Progress Note Due on Visit 10    PT Start Time 1303    PT Stop Time 1343    PT Time Calculation (min) 40 min    Activity Tolerance Patient tolerated treatment well;No increased pain    Behavior During Therapy WFL for tasks assessed/performed              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, right    Cataract cortical, senile, left    Colon polyp    COVID-19 02/26/2023   Diverticulitis 10/2022   Diverticulosis    Fibrocystic breast disease    GERD (gastroesophageal reflux disease)    Hyperlipidemia    Insomnia    Osteoarthritis of left knee    Osteopenia    Past Surgical History:  Procedure Laterality Date   ABDOMINAL HYSTERECTOMY  2001   APPENDECTOMY  1956   BREAST BIOPSY Left 1996   neg   BREAST BIOPSY Left 04/06/2021   stereo bx, ribbon clip, neg   CATARACT EXTRACTION W/ INTRAOCULAR LENS IMPLANT Left 01/2023   CATARACT EXTRACTION W/ INTRAOCULAR LENS IMPLANT Right 03/2023   COLONOSCOPY N/A 12/10/2014   Procedure: COLONOSCOPY;  Surgeon: Christena Deem, MD;  Location: [facility] ENDOSCOPY;  Service: Endoscopy;  Laterality: N/A;   COLONOSCOPY WITH PROPOFOL N/A 11/21/2020   Procedure: COLONOSCOPY WITH PROPOFOL;  Surgeon: Wyline Mood, MD;  Location: [facility] ENDOSCOPY;  Service: Gastroenterology;  Laterality: N/A;   EXCISION MASS UPPER EXTREMETIES Right 08/09/2021   Procedure: Excision of benign neoplasm right index PIP joint and repair of extensor tendon;  Surgeon: Christena Flake, MD;  Location:  [facility] ORS;  Service: Orthopedics;  Laterality: Right;   MASS EXCISION Right 03/19/2023   Procedure: EXCISION OF RECURRENT SOFT TISSUE MASS OF RIGHT INDEX FINGER;  Surgeon: Christena Flake, MD;  Location: [facility] ORS;  Service: Orthopedics;  Laterality: Right;   ORIF ANKLE FRACTURE Right 2006   2 screws and pin   TOTAL KNEE ARTHROPLASTY Left 05/16/2023   Procedure: TOTAL KNEE ARTHROPLASTY;  Surgeon: Christena Flake, MD;  Location: [facility] ORS;  Service: Orthopedics;  Laterality: Left;   Patient Active Problem List   Diagnosis Date Noted   Status post total knee replacement using cement, left 05/16/2023   Diverticulitis of large intestine with abscess without bleeding 10/29/2022   Diverticulitis 10/26/2022   Insomnia 10/26/2022   Abdominal pain 10/26/2022   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 A little  tight. Swelling is going down. No pain currently.      PERTINENT HISTORY: 72yoF referred for OPPT s/p Left TKA 10/24. Pt DC to home on POD1, then took HHPT for 2 weeks. Pt reports successful compliance and tolerance with HEP performance, achieved up to 96 degrees knee flexion at DC from HHPT. Pt reports knee well controlled overall, swelling moderate. Pt plans to return to work at Jan 13 at the latest. Pt transitioned from RW to [facility] ~2 weeks postop.    PAIN:  Are you having pain? 3/10 anterior knee pain   PRECAUTIONS: Knee  WEIGHT BEARING  RESTRICTIONS: WBAT   FALLS:  Has patient fallen in last 6 months? None   LIVING ENVIRONMENT: Lives with: Husband  Lives in: [place], 1 level   Stairs: 3 steps, 1 railing  Has following equipment at home: RW, [facility]    OCCUPATION: Works for [employer], planning to return in January if possible; works 20 hours weekly, some sitting, some time on feet   PLOF: unlimited walking without device   PATIENT GOALS: return to prior level   NEXT MD VISIT: FU in several weeks   OBJECTIVE:  Note: Objective measures were completed at Evaluation unless otherwise noted.  PATIENT SURVEYS:  FOTO: 47    LOWER EXTREMITY ROM:  Active ROM Left eval  Knee flexion 100  Knee extension 14   (Blank rows = not tested)  06/18/23: Flexion: 104 deg; Extension: 8 deg 06/25/23: Flexion: 120 deg; Extension: 7 deg 07/01/23: Flexion: 111 deg AROM; 7 deg AROM 07/03/23: Flexion: 121 deg AROM; 7 deg AROM 07/08/23: Flexion: 122 deg AROM; 7 deg AROM   At start of session: 07/22/2023: flexion 116 degrees AROM, -5 degrees extension AROM Flexion 125 degrees after treatment - 4 degrees seated L knee extension AROM after treatment   FUNCTIONAL TESTS:  -326ft AMB: 11sec, SPC RUE (0.67m/s)  -minimal antalgia, minimal asymmetry   TODAY'S TREATMENT: DATE: 07/22/23    Manual therapy  Seated STM L anterior knee to decrease fascial restrictions  Seated STM L medial and lateral hamstrings to decrease muscle tension.   TherEx:  Seated L knee AROM  Seated L knee flexoin AAROM with PT 10x3  125 degrees seated L knee flexion AROM afterwards.   Seated L knee quad set 10x5 seconds for 3 sets   Standing mini squats with TKE 10x5 seconds for 2 sets   - 4 degrees seated L knee extension AROM afterwards  Standing static mini lunge with contralateral UE assist   L 10x3  Standing with B UE assist   Knee flexion    L with 2 lb ankle weight 10x3       Improved exercise technique, movement at target joints,  use of target muscles after mod verbal, visual, tactile cues.      PATIENT EDUCATION:  Education details: HEP updates, ROM focus Person educated: Patient  Education method: 1:1 conversation  Education comprehension: 90%   HOME EXERCISE PROGRAM: Access Code: [email or web address] URL: [email or web address] Date: 06/04/2023 Prepared by: Alvera Novel  Exercises - Seated Knee Extension Stretch with Chair  - 2 x daily - 7 x weekly - 1 sets - 10 reps - 10 hold - Walk 3x daily for 2 minutes   (Carryovers from HPT continued as follows: supine hip ABDCT/ADD, mini squats, standing hip ABDCT bilat, standing heel raises x10)    ASSESSMENT:  CLINICAL IMPRESSION:   Improving L knee flexion and extension AROM, function, strength, and gait speed since initial evaluation.  Pt able to achieve 125 degrees flexion and -4 degrees L knee extension AROM after treatment today. Pt tolerated session well without aggravation of symptoms. Pt will benefit from continued skilled physical therapy services to improve ROM, strength and function and return to PLOF.       OBJECTIVE IMPAIRMENTS: Abnormal gait, decreased activity tolerance, decreased balance, decreased endurance, decreased knowledge of use of DME, decreased mobility, difficulty walking, decreased ROM, decreased strength, hypomobility, increased fascial restrictions, increased muscle spasms, and impaired flexibility.   ACTIVITY LIMITATIONS: carrying, lifting, standing, squatting, stairs, transfers, bed mobility, and locomotion level  PARTICIPATION LIMITATIONS: meal prep, cleaning, laundry, medication management, interpersonal relationship, driving, shopping, community activity, occupation, and yard work  PERSONAL FACTORS: Education, Financial risk analyst, Past/current experiences, Profession, Social background, and Transportation are also affecting patient's functional outcome.   REHAB POTENTIAL: Excellent  CLINICAL DECISION MAKING:  Stable/uncomplicated  EVALUATION COMPLEXITY: Moderate   GOALS: Goals reviewed with patient? No  LONG TERM GOALS: Target date: 07/16/23  FOTO survey score increase by >14 points.  Baseline: 47 07/10/23: FOTO: 71 Goal status: MET  2.  5xSTS hands free, symmetrical footing x11 seconds or even less.  Baseline:  17.91 sec 07/10/23: 9.82 sec with slight push off on knees Goal status: MET  3.  Left knee ROM extension <10 and flexion >125 to facilitate ability to sit in low sports car bucket seats Baseline: L Knee Extension: 14 deg lacking; L Knee Flexion: 100 deg 07/10/23: L Knee Extension: 4 deg lacking; L Knee Flexion: 126 deg Goal status: PROGRESSING  4.  faster than 1.35m/s to facilitate optimal ergonomics for acessing the community for IADL  Baseline: 0.95 m/s 07/10/23: 1.17 m/s Goal status: MET  5.  >1625ft without increase in pain >2 points and without any episodes of knee buckling.  Baseline: 344ft AMB at evaluation  07/10/23: 1427 ft with 2/10 pain Goal status: PROGRESSING  6.  SLS >15sec bilateral  Baseline: defer to later visits  07/10/23: L: 10 sec; R: 4 sec  Goal status: PROGRESSING   PLAN:  PT FREQUENCY: 1-2x/week  PT DURATION: 6 weeks  PLANNED INTERVENTIONS: 97110-Therapeutic exercises, 97530- Therapeutic activity, 97112- Neuromuscular re-education, 97535- Self Care, 86578- Manual therapy, [id number]- Gait training, [phone]- Prosthetic training, [phone]- Electrical stimulation (unattended), [phone]- Electrical stimulation (manual), Patient/Family education, Balance training, Stair training, and Dry Needling  PLAN FOR NEXT SESSION:   continue strengthening LE's and improve terminal knee extension.  Pt also still lacking full active knee flexion and would benefit from continued sustained stretches in order to improve tolerance.    Thank you for your referral.  Loralyn Freshwater PT, DPT  Physical Therapist - [facility]   07/22/23, 4:39 PM

## 2023-07-25 ENCOUNTER — Ambulatory Visit: Payer: Medicare PPO | Attending: Surgery

## 2023-07-25 DIAGNOSIS — M25662 Stiffness of left knee, not elsewhere classified: Secondary | ICD-10-CM | POA: Diagnosis present

## 2023-07-25 DIAGNOSIS — R262 Difficulty in walking, not elsewhere classified: Secondary | ICD-10-CM | POA: Diagnosis present

## 2023-07-25 NOTE — Therapy (Signed)
 OUTPATIENT PHYSICAL THERAPY TREATMENT    Patient Name: ILETA OFARRELL MRN: [id number] DOB:10-28-1950, 73 y.o., female Today's Date: 07/25/2023   END OF SESSION:   PT End of Session - 07/25/23 1305     Visit Number 11    Number of Visits 22    Date for PT Re-Evaluation 08/07/23    Authorization Type [employer]    Authorization Time Period 06/04/23-08/23/23    Progress Note Due on Visit 10    PT Start Time 1305    PT Stop Time 1345    PT Time Calculation (min) 40 min    Activity Tolerance Patient tolerated treatment well;No increased pain    Behavior During Therapy WFL for tasks assessed/performed               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, right    Cataract cortical, senile, left    Colon polyp    COVID-19 02/26/2023   Diverticulitis 10/2022   Diverticulosis    Fibrocystic breast disease    GERD (gastroesophageal reflux disease)    Hyperlipidemia    Insomnia    Osteoarthritis of left knee    Osteopenia    Past Surgical History:  Procedure Laterality Date   ABDOMINAL HYSTERECTOMY  2001   APPENDECTOMY  1956   BREAST BIOPSY Left 1996   neg   BREAST BIOPSY Left 04/06/2021   stereo bx, ribbon clip, neg   CATARACT EXTRACTION W/ INTRAOCULAR LENS IMPLANT Left 01/2023   CATARACT EXTRACTION W/ INTRAOCULAR LENS IMPLANT Right 03/2023   COLONOSCOPY N/A 12/10/2014   Procedure: COLONOSCOPY;  Surgeon: Gladis RAYMOND Mariner, MD;  Location[facility] ENDOSCOPY;  Service: Endoscopy;  Laterality: N/A;   COLONOSCOPY WITH PROPOFOL  N/A 11/21/2020   Procedure: COLONOSCOPY WITH PROPOFOL ;  Surgeon: Therisa Bi, MD;  Location: [facility] ENDOSCOPY;  Service: Gastroenterology;  Laterality: N/A;   EXCISION MASS UPPER EXTREMETIES Right 08/09/2021   Procedure: Excision of benign neoplasm right index PIP joint and repair of extensor tendon;  Surgeon: Edie Norleen PARAS, MD;  Location: [facility] ORS;  Service: Orthopedics;  Laterality:  Right;   MASS EXCISION Right 03/19/2023   Procedure: EXCISION OF RECURRENT SOFT TISSUE MASS OF RIGHT INDEX FINGER;  Surgeon: Edie Norleen PARAS, MD;  Location: [facility] ORS;  Service: Orthopedics;  Laterality: Right;   ORIF ANKLE FRACTURE Right 2006   2 screws and pin   TOTAL KNEE ARTHROPLASTY Left 05/16/2023   Procedure: TOTAL KNEE ARTHROPLASTY;  Surgeon: Edie Norleen PARAS, MD;  Location: [facility] ORS;  Service: Orthopedics;  Laterality: Left;   Patient Active Problem List   Diagnosis Date Noted   Status post total knee replacement using cement, left 05/16/2023   Diverticulitis of large intestine with abscess without bleeding 10/29/2022   Diverticulitis 10/26/2022   Insomnia 10/26/2022   Abdominal pain 10/26/2022   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 A little stiff  right now. Felt  pretty good after last session.   No pain currently.  Feels tight.     PERTINENT HISTORY: 72yoF referred for OPPT s/p Left TKA 10/24. Pt DC to home on POD1, then took HHPT for 2 weeks. Pt reports successful compliance and tolerance with HEP performance, achieved up to 96 degrees knee flexion at DC from HHPT. Pt reports knee well controlled overall, swelling moderate. Pt plans to return to work at Jan 13 at the latest. Pt transitioned from RW to [facility] ~2 weeks postop.    PAIN:  Are you having pain? 3/10 anterior knee pain   PRECAUTIONS: Knee  WEIGHT BEARING RESTRICTIONS: WBAT    FALLS:  Has patient fallen in last 6 months? None   LIVING ENVIRONMENT: Lives with: Husband  Lives in: [place], 1 level   Stairs: 3 steps, 1 railing  Has following equipment at home: RW, [facility]    OCCUPATION: Works for [employer], planning to return in January if possible; works 20 hours weekly, some sitting, some time on feet   PLOF: unlimited walking without device   PATIENT GOALS: return to prior level   NEXT MD VISIT: FU in several weeks   OBJECTIVE:  Note: Objective measures were completed at Evaluation unless otherwise noted.  PATIENT SURVEYS:  FOTO: 47    LOWER EXTREMITY ROM:  Active ROM Left eval  Knee flexion 100  Knee extension 14   (Blank rows = not tested)  06/18/23: Flexion: 104 deg; Extension: 8 deg 06/25/23: Flexion: 120 deg; Extension: 7 deg 07/01/23: Flexion: 111 deg AROM; 7 deg AROM 07/03/23: Flexion: 121 deg AROM; 7 deg AROM 07/08/23: Flexion: 122 deg AROM; 7 deg AROM   At start of session: 07/22/2023: flexion 116 degrees AROM, -5 degrees extension AROM Flexion 125 degrees after treatment - 4 degrees seated L knee extension AROM after treatment   FUNCTIONAL TESTS:  -322ft AMB: 11sec, SPC RUE (0.53m/s)  -minimal antalgia, minimal asymmetry   TODAY'S TREATMENT: DATE: 07/25/23    Manual therapy  Seated STM L anterior knee to decrease fascial restrictions  Supine STM L medial and lateral hamstrings to decrease muscle tension.     TherEx:  Seated L knee AROM  Seated L knee flexoin AAROM with PT 10x3  127 degrees seated L knee flexion AROM afterwards.   Supine L knee quad set 10x5 seconds for 3 sets   Standing hip abduction with B UE assist   Yellow band distal thighs  L 10x3  R 10x3  Stand to sit onto chair with yellow band around distal thighs  No UE assist  5x3  Standing heel toe raises with B UE assist 10x2  Seated hip adduction isometric folded pillow squeeze 10x5 seconds  SLS with contralateral UE assist and  contralateral foot on furniture slider  R hip circles 10x clockwise and counterclockwise     Improved exercise technique, movement at target joints, use of target muscles after mod verbal, visual, tactile cues.      PATIENT EDUCATION:  Education details: HEP updates, ROM focus Person educated: Patient  Education method: 1:1 conversation  Education comprehension: 90%   HOME EXERCISE PROGRAM: Access Code: [email or web address] URL: [email or web address] Date: 06/04/2023 Prepared by: Peggye Linear  Exercises - Seated Knee Extension Stretch with Chair  - 2 x daily - 7 x weekly - 1 sets - 10 reps - 10 hold - Walk 3x daily for 2 minutes   (Carryovers from HPT continued as follows: supine hip ABDCT/ADD, mini squats, standing hip ABDCT bilat, standing  heel raises x10)    ASSESSMENT:  CLINICAL IMPRESSION:  Continued working on improving knee flexion and extension AROM, as well as LE strengthening to promote ability to perform standing tasks with less difficulty.  Pt tolerated session well without aggravation of symptoms. Pt will benefit from continued skilled physical therapy services to improve ROM, strength and function and return to PLOF.       OBJECTIVE IMPAIRMENTS: Abnormal gait, decreased activity tolerance, decreased balance, decreased endurance, decreased knowledge of use of DME, decreased mobility, difficulty walking, decreased ROM, decreased strength, hypomobility, increased fascial restrictions, increased muscle spasms, and impaired flexibility.   ACTIVITY LIMITATIONS: carrying, lifting, standing, squatting, stairs, transfers, bed mobility, and locomotion level  PARTICIPATION LIMITATIONS: meal prep, cleaning, laundry, medication management, interpersonal relationship, driving, shopping, community activity, occupation, and yard work  PERSONAL FACTORS: Education, Financial Risk Analyst, Past/current experiences, Profession, Social background, and Transportation are also affecting  patient's functional outcome.   REHAB POTENTIAL: Excellent  CLINICAL DECISION MAKING: Stable/uncomplicated  EVALUATION COMPLEXITY: Moderate   GOALS: Goals reviewed with patient? No  LONG TERM GOALS: Target date: 07/16/23  FOTO survey score increase by >14 points.  Baseline: 47 07/10/23: FOTO: 71 Goal status: MET  2.  5xSTS hands free, symmetrical footing x11 seconds or even less.  Baseline:  17.91 sec 07/10/23: 9.82 sec with slight push off on knees Goal status: MET  3.  Left knee ROM extension <10 and flexion >125 to facilitate ability to sit in low sports car bucket seats Baseline: L Knee Extension: 14 deg lacking; L Knee Flexion: 100 deg 07/10/23: L Knee Extension: 4 deg lacking; L Knee Flexion: 126 deg Goal status: PROGRESSING  4.  faster than 1.44m/s to facilitate optimal ergonomics for acessing the community for IADL  Baseline: 0.95 m/s 07/10/23: 1.17 m/s Goal status: MET  5.  >163ft without increase in pain >2 points and without any episodes of knee buckling.  Baseline: 324ft AMB at evaluation  07/10/23: 1427 ft with 2/10 pain Goal status: PROGRESSING  6.  SLS >15sec bilateral  Baseline: defer to later visits  07/10/23: L: 10 sec; R: 4 sec  Goal status: PROGRESSING   PLAN:  PT FREQUENCY: 1-2x/week  PT DURATION: 6 weeks  PLANNED INTERVENTIONS: 97110-Therapeutic exercises, 97530- Therapeutic activity, 97112- Neuromuscular re-education, 97535- Self Care, [id number]- Manual therapy, [id number]- Gait training, [phone]- Prosthetic training, [phone]- Electrical stimulation (unattended), [phone]- Electrical stimulation (manual), Patient/Family education, Balance training, Stair training, and Dry Needling  PLAN FOR NEXT SESSION:   continue strengthening LE's and improve terminal knee extension.  Pt also still lacking full active knee flexion and would benefit from continued sustained stretches in order to improve tolerance.     Emil Glassman PT, DPT  Physical  Therapist - [facility]  07/25/23, 4:26 PM

## 2023-07-29 ENCOUNTER — Ambulatory Visit: Payer: Medicare PPO

## 2023-07-29 DIAGNOSIS — M25662 Stiffness of left knee, not elsewhere classified: Secondary | ICD-10-CM

## 2023-07-29 DIAGNOSIS — R262 Difficulty in walking, not elsewhere classified: Secondary | ICD-10-CM

## 2023-07-29 NOTE — Therapy (Signed)
 OUTPATIENT PHYSICAL THERAPY TREATMENT    Patient Name: DENNA FRYBERGER MRN: [id number] DOB:05-Feb-1951, 73 y.o., female Today's Date: 07/29/2023   END OF SESSION:   PT End of Session - 07/29/23 1301     Visit Number 12    Number of Visits 22    Date for PT Re-Evaluation 08/07/23    Authorization Type [employer]    Authorization Time Period 06/04/23-08/23/23    Progress Note Due on Visit 10    PT Start Time 1301    PT Stop Time 1342    PT Time Calculation (min) 41 min    Activity Tolerance Patient tolerated treatment well;No increased pain    Behavior During Therapy WFL for tasks assessed/performed                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, right    Cataract cortical, senile, left    Colon polyp    COVID-19 02/26/2023   Diverticulitis 10/2022   Diverticulosis    Fibrocystic breast disease    GERD (gastroesophageal reflux disease)    Hyperlipidemia    Insomnia    Osteoarthritis of left knee    Osteopenia    Past Surgical History:  Procedure Laterality Date   ABDOMINAL HYSTERECTOMY  2001   APPENDECTOMY  1956   BREAST BIOPSY Left 1996   neg   BREAST BIOPSY Left 04/06/2021   stereo bx, ribbon clip, neg   CATARACT EXTRACTION W/ INTRAOCULAR LENS IMPLANT Left 01/2023   CATARACT EXTRACTION W/ INTRAOCULAR LENS IMPLANT Right 03/2023   COLONOSCOPY N/A 12/10/2014   Procedure: COLONOSCOPY;  Surgeon: Gladis RAYMOND Mariner, MD;  Location: [facility] ENDOSCOPY;  Service: Endoscopy;  Laterality: N/A;   COLONOSCOPY WITH PROPOFOL  N/A 11/21/2020   Procedure: COLONOSCOPY WITH PROPOFOL ;  Surgeon: Therisa Bi, MD;  Location: [facility] ENDOSCOPY;  Service: Gastroenterology;  Laterality: N/A;   EXCISION MASS UPPER EXTREMETIES Right 08/09/2021   Procedure: Excision of benign neoplasm right index PIP joint and repair of extensor tendon;  Surgeon: Edie Norleen PARAS, MD;  Location: [facility] ORS;  Service: Orthopedics;  Laterality:  Right;   MASS EXCISION Right 03/19/2023   Procedure: EXCISION OF RECURRENT SOFT TISSUE MASS OF RIGHT INDEX FINGER;  Surgeon: Edie Norleen PARAS, MD;  Location: [facility] ORS;  Service: Orthopedics;  Laterality: Right;   ORIF ANKLE FRACTURE Right 2006   2 screws and pin   TOTAL KNEE ARTHROPLASTY Left 05/16/2023   Procedure: TOTAL KNEE ARTHROPLASTY;  Surgeon: Edie Norleen PARAS, MD;  Location: [facility] ORS;  Service: Orthopedics;  Laterality: Left;   Patient Active Problem List   Diagnosis Date Noted   Status post total knee replacement using cement, left 05/16/2023   Diverticulitis of large intestine with abscess without bleeding 10/29/2022   Diverticulitis 10/26/2022   Insomnia 10/26/2022   Abdominal pain 10/26/2022   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 Good today, just tight.     PERTINENT HISTORY: 72yoF referred for OPPT s/p Left TKA 10/24. Pt DC to home on POD1, then took HHPT for 2 weeks. Pt reports successful compliance and tolerance with HEP performance, achieved up to 96 degrees knee flexion at DC from HHPT. Pt reports knee well controlled overall, swelling moderate. Pt plans to return to work at Jan 13 at the latest. Pt transitioned from RW to [facility] ~2 weeks postop.    PAIN:  Are you having pain? 3/10 anterior knee pain   PRECAUTIONS: Knee  WEIGHT BEARING RESTRICTIONS: WBAT   FALLS:  Has patient fallen in  last 6 months? None   LIVING ENVIRONMENT: Lives with: Husband  Lives in: [place], 1 level   Stairs: 3 steps, 1 railing  Has following equipment at home: RW, [facility]    OCCUPATION: Works for [employer], planning to return in January if possible; works 20 hours weekly, some sitting, some time on feet   PLOF: unlimited walking without device   PATIENT GOALS: return to prior level   NEXT MD VISIT: FU in several weeks   OBJECTIVE:  Note: Objective measures were completed at Evaluation unless otherwise noted.  PATIENT SURVEYS:  FOTO: 47    LOWER EXTREMITY ROM:  Active ROM Left eval  Knee flexion 100  Knee extension 14   (Blank rows = not tested)  06/18/23: Flexion: 104 deg; Extension: 8 deg 06/25/23: Flexion: 120 deg; Extension: 7 deg 07/01/23: Flexion: 111 deg AROM; 7 deg AROM 07/03/23: Flexion: 121 deg AROM; 7 deg AROM 07/08/23: Flexion: 122 deg AROM; 7 deg AROM   At start of session: 07/22/2023: flexion 116 degrees AROM, -5 degrees extension AROM Flexion 125 degrees after treatment - 4 degrees seated L knee extension AROM after treatment   FUNCTIONAL TESTS:  -332ft AMB: 11sec, SPC RUE (0.47m/s)  -minimal antalgia, minimal asymmetry   TODAY'S TREATMENT: DATE: 07/29/23    Manual therapy   supine STM L medial and lateral hamstrings to decrease muscle tension.    TherEx: Sit <> stand on regular chair with yellow band around distal thighs  No UE assist  10x2  Standing hip abduction with B UE assist   Yellow band distal thighs  L 10x3  R 10x3  Side stepping with yellow band around distal thighs  32 ft to the R and 32 ft to the L for 2 sets  Standing static mini lunge with contralateral UE assist   L 10x2 with towel padding  SLS with contralateral UE assist and contralateral foot on furniture slider  R hip circles 10x3 clockwise and counterclockwise  Forward step up onto first regular step with B UE assist to one UE light touch assis  L 10x3  Seated L  knee AROM  -7 to 125 degrees   Supine L knee quad set 10x5 seconds for 2 sets       Improved exercise technique, movement at target joints, use of target muscles after mod verbal, visual, tactile cues.      PATIENT EDUCATION:  Education details: HEP updates, ROM focus Person educated: Patient  Education method: 1:1 conversation  Education comprehension: 90%   HOME EXERCISE PROGRAM: Access Code: [email or web address] URL: [email or web address] Date: 06/04/2023 Prepared by: Peggye Linear  Exercises - Seated Knee Extension Stretch with Chair  - 2 x daily - 7 x weekly - 1 sets - 10 reps - 10 hold - Walk 3x daily for 2 minutes   (Carryovers from HPT continued as  follows: supine hip ABDCT/ADD, mini squats, standing hip ABDCT bilat, standing heel raises x10)    ASSESSMENT:  CLINICAL IMPRESSION:   Decreased stiffness reported after exercises. Continued working on improving knee flexion and extension AROM, as well as LE strengthening to promote ability to perform standing tasks with less difficulty.  Pt tolerated session well without aggravation of symptoms. Pt will benefit from continued skilled physical therapy services to improve ROM, strength and function and return to PLOF.       OBJECTIVE IMPAIRMENTS: Abnormal gait, decreased activity tolerance, decreased balance, decreased endurance, decreased knowledge of use of DME, decreased mobility, difficulty walking, decreased ROM, decreased strength, hypomobility, increased fascial restrictions, increased muscle spasms, and impaired flexibility.   ACTIVITY LIMITATIONS: carrying, lifting, standing, squatting, stairs, transfers, bed mobility, and locomotion level  PARTICIPATION LIMITATIONS: meal prep, cleaning, laundry, medication management, interpersonal relationship, driving, shopping, community activity, occupation, and yard work  PERSONAL FACTORS: Education, Financial Risk Analyst, Past/current experiences, Profession, Social background,  and Transportation are also affecting patient's functional outcome.   REHAB POTENTIAL: Excellent  CLINICAL DECISION MAKING: Stable/uncomplicated  EVALUATION COMPLEXITY: Moderate   GOALS: Goals reviewed with patient? No  LONG TERM GOALS: Target date: 07/16/23  FOTO survey score increase by >14 points.  Baseline: 47 07/10/23: FOTO: 71 Goal status: MET  2.  5xSTS hands free, symmetrical footing x11 seconds or even less.  Baseline:  17.91 sec 07/10/23: 9.82 sec with slight push off on knees Goal status: MET  3.  Left knee ROM extension <10 and flexion >125 to facilitate ability to sit in low sports car bucket seats Baseline: L Knee Extension: 14 deg lacking; L Knee Flexion: 100 deg 07/10/23: L Knee Extension: 4 deg lacking; L Knee Flexion: 126 deg Goal status: PROGRESSING  4.  faster than 1.49m/s to facilitate optimal ergonomics for acessing the community for IADL  Baseline: 0.95 m/s 07/10/23: 1.17 m/s Goal status: MET  5.  >1667ft without increase in pain >2 points and without any episodes of knee buckling.  Baseline: 373ft AMB at evaluation  07/10/23: 1427 ft with 2/10 pain Goal status: PROGRESSING  6.  SLS >15sec bilateral  Baseline: defer to later visits  07/10/23: L: 10 sec; R: 4 sec  Goal status: PROGRESSING   PLAN:  PT FREQUENCY: 1-2x/week  PT DURATION: 6 weeks  PLANNED INTERVENTIONS: 97110-Therapeutic exercises, 97530- Therapeutic activity, 97112- Neuromuscular re-education, 97535- Self Care, 02859- Manual therapy, [id number]- Gait training, [phone]- Prosthetic training, [phone]- Electrical stimulation (unattended), [phone]- Electrical stimulation (manual), Patient/Family education, Balance training, Stair training, and Dry Needling  PLAN FOR NEXT SESSION:   continue strengthening LE's and improve terminal knee extension.  Pt also still lacking full active knee flexion and would benefit from continued sustained stretches in order to improve  tolerance.     Emil Glassman PT, DPT  Physical Therapist - [facility]  07/29/23, 4:53 PM

## 2023-07-31 ENCOUNTER — Ambulatory Visit: Payer: Medicare PPO

## 2023-07-31 ENCOUNTER — Telehealth: Payer: Self-pay

## 2023-07-31 NOTE — Telephone Encounter (Signed)
 No show. Called patient who said that she thought her appointment was for tomorrow. Will be able to make to her next follow up appointment.

## 2023-08-05 ENCOUNTER — Ambulatory Visit: Payer: Medicare PPO

## 2023-08-05 DIAGNOSIS — R262 Difficulty in walking, not elsewhere classified: Secondary | ICD-10-CM

## 2023-08-05 DIAGNOSIS — M25662 Stiffness of left knee, not elsewhere classified: Secondary | ICD-10-CM | POA: Diagnosis not present

## 2023-08-05 NOTE — Therapy (Signed)
 OUTPATIENT PHYSICAL THERAPY TREATMENT    Patient Name: Patricia Oliver MRN: [id number] DOB:05-23-1951, 73 y.o., female Today's Date: 08/05/2023   END OF SESSION:   PT End of Session - 08/05/23 1306     Visit Number 13    Number of Visits 22    Date for PT Re-Evaluation 08/07/23    Authorization Type [employer]    Authorization Time Period 06/04/23-08/23/23    Progress Note Due on Visit 10    PT Start Time 1306    PT Stop Time 1344    PT Time Calculation (min) 38 min    Activity Tolerance Patient tolerated treatment well;No increased pain    Behavior During Therapy WFL for tasks assessed/performed                 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, right    Cataract cortical, senile, left    Colon polyp    COVID-19 02/26/2023   Diverticulitis 10/2022   Diverticulosis    Fibrocystic breast disease    GERD (gastroesophageal reflux disease)    Hyperlipidemia    Insomnia    Osteoarthritis of left knee    Osteopenia    Past Surgical History:  Procedure Laterality Date   ABDOMINAL HYSTERECTOMY  2001   APPENDECTOMY  1956   BREAST BIOPSY Left 1996   neg   BREAST BIOPSY Left 04/06/2021   stereo bx, ribbon clip, neg   CATARACT EXTRACTION W/ INTRAOCULAR LENS IMPLANT Left 01/2023   CATARACT EXTRACTION W/ INTRAOCULAR LENS IMPLANT Right 03/2023   COLONOSCOPY N/A 12/10/2014   Procedure: COLONOSCOPY;  Surgeon: Gladis RAYMOND Mariner, MD;  Location: [facility] ENDOSCOPY;  Service: Endoscopy;  Laterality: N/A;   COLONOSCOPY WITH PROPOFOL  N/A 11/21/2020   Procedure: COLONOSCOPY WITH PROPOFOL ;  Surgeon: Therisa Bi, MD;  Location: [facility] ENDOSCOPY;  Service: Gastroenterology;  Laterality: N/A;   EXCISION MASS UPPER EXTREMETIES Right 08/09/2021   Procedure: Excision of benign neoplasm right index PIP joint and repair of extensor tendon;  Surgeon: Edie Norleen PARAS, MD;  Location: [facility] ORS;  Service: Orthopedics;   Laterality: Right;   MASS EXCISION Right 03/19/2023   Procedure: EXCISION OF RECURRENT SOFT TISSUE MASS OF RIGHT INDEX FINGER;  Surgeon: Edie Norleen PARAS, MD;  Location: [facility] ORS;  Service: Orthopedics;  Laterality: Right;   ORIF ANKLE FRACTURE Right 2006   2 screws and pin   TOTAL KNEE ARTHROPLASTY Left 05/16/2023   Procedure: TOTAL KNEE ARTHROPLASTY;  Surgeon: Edie Norleen PARAS, MD;  Location: [facility] ORS;  Service: Orthopedics;  Laterality: Left;   Patient Active Problem List   Diagnosis Date Noted   Status post total knee replacement using cement, left 05/16/2023   Diverticulitis of large intestine with abscess without bleeding 10/29/2022   Diverticulitis 10/26/2022   Insomnia 10/26/2022   Abdominal pain 10/26/2022   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 No pain.  PERTINENT HISTORY: 72yoF referred for OPPT s/p Left TKA 10/24. Pt DC to home on POD1, then took HHPT for 2 weeks. Pt reports successful compliance and tolerance with HEP performance, achieved up to 96 degrees knee flexion at DC from HHPT. Pt reports knee well controlled overall, swelling moderate. Pt plans to return to work at Jan 13 at the latest. Pt transitioned from RW to [facility] ~2 weeks postop.    PAIN:  Are you having pain? 3/10 anterior knee pain   PRECAUTIONS: Knee  WEIGHT BEARING RESTRICTIONS: WBAT   FALLS:  Has patient fallen in last 6 months? None    LIVING ENVIRONMENT: Lives with: Husband  Lives in: [place], 1 level   Stairs: 3 steps, 1 railing  Has following equipment at home: RW, [facility]    OCCUPATION: Works for [employer], planning to return in January if possible; works 20 hours weekly, some sitting, some time on feet   PLOF: unlimited walking without device   PATIENT GOALS: return to prior level   NEXT MD VISIT: FU in several weeks   OBJECTIVE:  Note: Objective measures were completed at Evaluation unless otherwise noted.  PATIENT SURVEYS:  FOTO: 47    LOWER EXTREMITY ROM:  Active ROM Left eval  Knee flexion 100  Knee extension 14   (Blank rows = not tested)  06/18/23: Flexion: 104 deg; Extension: 8 deg 06/25/23: Flexion: 120 deg; Extension: 7 deg 07/01/23: Flexion: 111 deg AROM; 7 deg AROM 07/03/23: Flexion: 121 deg AROM; 7 deg AROM 07/08/23: Flexion: 122 deg AROM; 7 deg AROM  Prior to treatment: 08/15/2023: flexion 123, -4 degrees extension   At start of session: 07/22/2023: flexion 116 degrees AROM, -5 degrees extension AROM Flexion 125 degrees after treatment - 4 degrees seated L knee extension AROM after treatment   FUNCTIONAL TESTS:  -358ft AMB: 11sec, SPC RUE (0.33m/s)  -minimal antalgia, minimal asymmetry   TODAY'S TREATMENT: DATE: 08/05/23    Manual therapy   supine STM L medial and lateral hamstrings to decrease muscle tension.    TherEx:  Gait x 6 minutes   1551 ft, no AD  SLS  L 31 seconds, R 6 seconds, no UE assist  Seated L knee AROM   Reviewed progress/current status with pt.   Supine quad set  L 10x5 seconds   Seated knee flexion AAROM 10x3 with PT  130 degrees afterwards   Forward step up onto first regular step with B UE assist to one UE light touch assis  L 10x      Improved exercise technique, movement at target joints, use of target muscles after mod verbal, visual, tactile cues.      PATIENT EDUCATION:  Education details: HEP updates, ROM focus Person  educated: Patient  Education method: 1:1 conversation  Education comprehension: 90%   HOME EXERCISE PROGRAM: Access Code: [email or web address] URL: [email or web address] Date: 06/04/2023 Prepared by: Peggye Linear  Exercises - Seated Knee Extension Stretch with Chair  - 2 x daily - 7 x weekly - 1 sets - 10 reps - 10 hold - Walk 3x daily for 2 minutes   (Carryovers from HPT continued as follows: supine hip ABDCT/ADD, mini squats, standing hip ABDCT bilat, standing heel raises x10)    ASSESSMENT:  CLINICAL IMPRESSION:  Improved 6 minute walk distance and L LE single leg stance balance compared to previous measurement. Able to achieve 130 degrees seated L knee flexion AROM after treatment.   Pt tolerated session well without aggravation of symptoms. Pt will  benefit from continued skilled physical therapy services to improve ROM, strength and function and return to PLOF.       OBJECTIVE IMPAIRMENTS: Abnormal gait, decreased activity tolerance, decreased balance, decreased endurance, decreased knowledge of use of DME, decreased mobility, difficulty walking, decreased ROM, decreased strength, hypomobility, increased fascial restrictions, increased muscle spasms, and impaired flexibility.   ACTIVITY LIMITATIONS: carrying, lifting, standing, squatting, stairs, transfers, bed mobility, and locomotion level  PARTICIPATION LIMITATIONS: meal prep, cleaning, laundry, medication management, interpersonal relationship, driving, shopping, community activity, occupation, and yard work  PERSONAL FACTORS: Education, Financial Risk Analyst, Past/current experiences, Profession, Social background, and Transportation are also affecting patient's functional outcome.   REHAB POTENTIAL: Excellent  CLINICAL DECISION MAKING: Stable/uncomplicated  EVALUATION COMPLEXITY: Moderate   GOALS: Goals reviewed with patient? No  LONG TERM GOALS: Target date: 07/16/23  FOTO survey score increase by >14 points.   Baseline: 47 07/10/23: FOTO: 71 Goal status: MET  2.  5xSTS hands free, symmetrical footing x11 seconds or even less.  Baseline:  17.91 sec 07/10/23: 9.82 sec with slight push off on knees Goal status: MET  3.  Left knee ROM extension <10 and flexion >125 to facilitate ability to sit in low sports car bucket seats Baseline: L Knee Extension: 14 deg lacking; L Knee Flexion: 100 deg 07/10/23: L Knee Extension: 4 deg lacking; L Knee Flexion: 126 deg 08/15/2023: flexion 130, -4 degrees extension AROM   Goal status: MET  4.  faster than 1.81m/s to facilitate optimal ergonomics for acessing the community for IADL  Baseline: 0.95 m/s 07/10/23: 1.17 m/s Goal status: MET  5.  >1674ft without increase in pain >2 points and without any episodes of knee buckling.  Baseline: 390ft AMB at evaluation  07/10/23: 1427 ft with 2/10 pain; 08/05/2023: 1551 ft, no AD, no knee pain.  Goal status: Almost met  6.  SLS >15sec bilateral  Baseline: defer to later visits  07/10/23: L: 10 sec; R: 4 sec; 08/05/2023:  L 31 seconds; 6 seconds Goal status: Partially met    PLAN:  PT FREQUENCY: 1-2x/week  PT DURATION: 6 weeks  PLANNED INTERVENTIONS: 97110-Therapeutic exercises, 97530- Therapeutic activity, 97112- Neuromuscular re-education, 97535- Self Care, 02859- Manual therapy, [id number]- Gait training, [phone]- Prosthetic training, [phone]- Electrical stimulation (unattended), [phone]- Electrical stimulation (manual), Patient/Family education, Balance training, Stair training, and Dry Needling  PLAN FOR NEXT SESSION:   continue strengthening LE's and improve terminal knee extension.  Pt also still lacking full active knee flexion and would benefit from continued sustained stretches in order to improve tolerance.     Emil Glassman PT, DPT  Physical Therapist - [facility]  08/05/23, 4:29 PM

## 2023-08-07 ENCOUNTER — Ambulatory Visit: Payer: Medicare PPO

## 2023-08-07 DIAGNOSIS — M25662 Stiffness of left knee, not elsewhere classified: Secondary | ICD-10-CM | POA: Diagnosis not present

## 2023-08-07 DIAGNOSIS — R262 Difficulty in walking, not elsewhere classified: Secondary | ICD-10-CM

## 2023-08-07 NOTE — Therapy (Signed)
 OUTPATIENT PHYSICAL THERAPY TREATMENT And Discharge Summary   Patient Name: Patricia Oliver MRN: [id number] DOB:Jun 24, 1951, 73 y.o., female Today's Date: 08/07/2023   END OF SESSION:   PT End of Session - 08/07/23 1303     Visit Number 14    Number of Visits 22    Date for PT Re-Evaluation 08/07/23    Authorization Type [employer]    Authorization Time Period 06/04/23-08/23/23    Progress Note Due on Visit 10    PT Start Time 1303    PT Stop Time 1342    PT Time Calculation (min) 39 min    Activity Tolerance Patient tolerated treatment well;No increased pain    Behavior During Therapy WFL for tasks assessed/performed                  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, right    Cataract cortical, senile, left    Colon polyp    COVID-19 02/26/2023   Diverticulitis 10/2022   Diverticulosis    Fibrocystic breast disease    GERD (gastroesophageal reflux disease)    Hyperlipidemia    Insomnia    Osteoarthritis of left knee    Osteopenia    Past Surgical History:  Procedure Laterality Date   ABDOMINAL HYSTERECTOMY  2001   APPENDECTOMY  1956   BREAST BIOPSY Left 1996   neg   BREAST BIOPSY Left 04/06/2021   stereo bx, ribbon clip, neg   CATARACT EXTRACTION W/ INTRAOCULAR LENS IMPLANT Left 01/2023   CATARACT EXTRACTION W/ INTRAOCULAR LENS IMPLANT Right 03/2023   COLONOSCOPY N/A 12/10/2014   Procedure: COLONOSCOPY;  Surgeon: Deveron Fly, MD;  Location: [facility] ENDOSCOPY;  Service: Endoscopy;  Laterality: N/A;   COLONOSCOPY WITH PROPOFOL  N/A 11/21/2020   Procedure: COLONOSCOPY WITH PROPOFOL ;  Surgeon: Luke Salaam, MD;  Location: [facility] ENDOSCOPY;  Service: Gastroenterology;  Laterality: N/A;   EXCISION MASS UPPER EXTREMETIES Right 08/09/2021   Procedure: Excision of benign neoplasm right index PIP joint and repair of extensor tendon;  Surgeon: Elner Hahn, MD;  Location: [facility] ORS;  Service:  Orthopedics;  Laterality: Right;   MASS EXCISION Right 03/19/2023   Procedure: EXCISION OF RECURRENT SOFT TISSUE MASS OF RIGHT INDEX FINGER;  Surgeon: Elner Hahn, MD;  Location: [facility] ORS;  Service: Orthopedics;  Laterality: Right;   ORIF ANKLE FRACTURE Right 2006   2 screws and pin   TOTAL KNEE ARTHROPLASTY Left 05/16/2023   Procedure: TOTAL KNEE ARTHROPLASTY;  Surgeon: Elner Hahn, MD;  Location: [facility] ORS;  Service: Orthopedics;  Laterality: Left;   Patient Active Problem List   Diagnosis Date Noted   Status post total knee replacement using cement, left 05/16/2023   Diverticulitis of large intestine with abscess without bleeding 10/29/2022   Diverticulitis 10/26/2022   Insomnia 10/26/2022   Abdominal pain 10/26/2022   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 No pain.  Just stiff.     PERTINENT HISTORY: 72yoF referred for OPPT s/p Left TKA 10/24. Pt DC to home on POD1, then took HHPT for 2 weeks. Pt reports successful compliance and tolerance with HEP performance, achieved up to 96 degrees knee flexion at DC from HHPT. Pt reports knee well controlled overall, swelling moderate. Pt plans to return to work at Jan 13 at the latest. Pt transitioned from RW to [facility] ~2 weeks postop.    PAIN:  Are you having pain? 3/10 anterior knee pain   PRECAUTIONS: Knee  WEIGHT BEARING RESTRICTIONS: WBAT   FALLS:  Has patient fallen in last 6 months?  None   LIVING ENVIRONMENT: Lives with: Husband  Lives in: [place], 1 level   Stairs: 3 steps, 1 railing  Has following equipment at home: RW, [facility]    OCCUPATION: Works for [employer], planning to return in January if possible; works 20 hours weekly, some sitting, some time on feet   PLOF: unlimited walking without device   PATIENT GOALS: return to prior level   NEXT MD VISIT: FU in several weeks   OBJECTIVE:  Note: Objective measures were completed at Evaluation unless otherwise noted.  PATIENT SURVEYS:  FOTO: 47    LOWER EXTREMITY ROM:  Active ROM Left eval  Knee flexion 100  Knee extension 14   (Blank rows = not tested)  06/18/23: Flexion: 104 deg; Extension: 8 deg 06/25/23: Flexion: 120 deg; Extension: 7 deg 07/01/23: Flexion: 111 deg AROM; 7 deg AROM 07/03/23: Flexion: 121 deg AROM; 7 deg AROM 07/08/23: Flexion: 122 deg AROM; 7 deg AROM  Prior to treatment: 08/15/2023: flexion 123, -4 degrees extension   At start of session: 07/22/2023: flexion 116 degrees AROM, -5 degrees extension AROM Flexion 125 degrees after treatment - 4 degrees seated L knee extension AROM after treatment   FUNCTIONAL TESTS:  -334ft AMB: 11sec, SPC RUE (0.30m/s)  -minimal antalgia, minimal asymmetry   TODAY'S TREATMENT: DATE: 08/07/23    Manual therapy   supine STM L  lateral hamstrings to decrease muscle tension.   TherEx:   Supine quad set  L 10x5 seconds for 2 sets  Seated knee flexion AAROM 10x3 with PT  124 degrees afterwards   Standing static mini lunge with contralateral UE assist  L 10x3  Side stepping with yellow band around distal thighs 32 ft to the R and 32 ft to the L   With yellow band around distal thighs  Squat to chair 5x2   Forward step up onto first regular step with one UE light touch assist PRN  L 10x3   SLS on L LE with R foot on furniture slider, one UE assist PRN   R hip circles 10x each direction   Hip abduction with B UE assist   R  10x3   L 10x3      Improved exercise technique, movement at target joints, use of target muscles after mod verbal, visual, tactile cues.      PATIENT EDUCATION:  Education details: HEP updates, ROM focus Person educated: Patient  Education method: 1:1 conversation  Education comprehension: 90%   HOME EXERCISE PROGRAM: Access Code: [email or web address] URL: [email or web address] Date: 06/04/2023 Prepared by: Atlee Blanks  Exercises - Seated Knee Extension Stretch with Chair  - 2 x daily - 7 x weekly - 1 sets - 10 reps - 10 hold - Walk 3x daily for 2 minutes   (Carryovers from HPT continued as follows: supine hip ABDCT/ADD, mini squats, standing hip  ABDCT bilat, standing heel raises x10)    ASSESSMENT:  CLINICAL IMPRESSION:  Pt demonstrates improved L knee ROM, function, strength, and ability to ambulate longer distance as well as increased gait speed independently since initial evaluation. Pt has made very good progress with PT towards goals. Skilled physical therapy services discharged with pt continuing with her HEP.        OBJECTIVE IMPAIRMENTS: Abnormal gait, decreased activity tolerance, decreased balance, decreased endurance, decreased knowledge of use of DME, decreased mobility, difficulty walking, decreased ROM, decreased strength, hypomobility, increased fascial restrictions, increased muscle spasms, and impaired flexibility.   ACTIVITY LIMITATIONS: carrying, lifting, standing, squatting, stairs, transfers, bed mobility, and locomotion level  PARTICIPATION LIMITATIONS: meal prep, cleaning, laundry, medication management, interpersonal relationship, driving, shopping, community activity, occupation, and yard work  PERSONAL FACTORS: Education, Financial risk analyst, Past/current experiences, Profession, Social background, and Transportation are also affecting patient's functional outcome.   REHAB POTENTIAL: Excellent  CLINICAL DECISION MAKING:  Stable/uncomplicated  EVALUATION COMPLEXITY: Moderate   GOALS: Goals reviewed with patient? No  LONG TERM GOALS: Target date: 07/16/23  FOTO survey score increase by >14 points.  Baseline: 47 07/10/23: FOTO: 71 Goal status: MET  2.  5xSTS hands free, symmetrical footing x11 seconds or even less.  Baseline:  17.91 sec 07/10/23: 9.82 sec with slight push off on knees Goal status: MET  3.  Left knee ROM extension <10 and flexion >125 to facilitate ability to sit in low sports car bucket seats Baseline: L Knee Extension: 14 deg lacking; L Knee Flexion: 100 deg 07/10/23: L Knee Extension: 4 deg lacking; L Knee Flexion: 126 deg 08/05/2023: flexion 130, -4 degrees extension AROM   Goal status: MET  4.  faster than 1.6m/s to facilitate optimal ergonomics for acessing the community for IADL  Baseline: 0.95 m/s 07/10/23: 1.17 m/s Goal status: MET  5.  >164ft without increase in pain >2 points and without any episodes of knee buckling.  Baseline: 352ft AMB at evaluation  07/10/23: 1427 ft with 2/10 pain; 08/05/2023: 1551 ft, no AD, no knee pain.  Goal status: Almost met  6.  SLS >15sec bilateral  Baseline: defer to later visits  07/10/23: L: 10 sec; R: 4 sec; 08/05/2023:  L 31 seconds; 6 seconds Goal status: Partially met    PLAN:  PT FREQUENCY: 1-2x/week  PT DURATION: 6 weeks  PLANNED INTERVENTIONS: 97110-Therapeutic exercises, 97530- Therapeutic activity, 97112- Neuromuscular re-education, 97535- Self Care, [address]- Manual therapy, [id number]- Gait training, [phone]- Prosthetic training, [phone]- Electrical stimulation (unattended), [phone]- Electrical stimulation (manual), Patient/Family education, Balance training, Stair training, and Dry Needling  PLAN FOR NEXT SESSION:   continue strengthening LE's and improve terminal knee extension.  Pt also still lacking full active knee flexion and would benefit from continued sustained stretches in order to improve tolerance.   Thank  you for your referral.   Suzzane Estes PT, DPT  Physical Therapist - [facility]  08/07/23, 3:04 PM

## 2023-08-09 ENCOUNTER — Ambulatory Visit: Payer: Medicare PPO

## 2023-08-12 ENCOUNTER — Ambulatory Visit: Payer: Medicare PPO

## 2023-08-16 ENCOUNTER — Other Ambulatory Visit: Payer: Self-pay

## 2023-08-16 MED ORDER — ABRYSVO 120 MCG/0.5ML IM SOLR
0.5000 mL | INTRAMUSCULAR | 0 refills | Status: AC
  Filled 2023-08-16: qty 1, 1d supply, fill #0

## 2023-08-19 ENCOUNTER — Telehealth: Payer: Self-pay

## 2023-08-19 NOTE — Telephone Encounter (Signed)
Called patient back and she just wanted me to know that she was ready to have her procedure next week. Patient had no further questions.

## 2023-08-19 NOTE — Telephone Encounter (Signed)
Pt left vm stating she has a colonoscopy next week on Feb 3rd and she has some questions about her medications prior to. Please return call.

## 2023-08-26 ENCOUNTER — Ambulatory Visit: Payer: Medicare PPO | Admitting: Certified Registered"

## 2023-08-26 ENCOUNTER — Ambulatory Visit
Admission: RE | Admit: 2023-08-26 | Discharge: 2023-08-26 | Disposition: A | Discharge status: Home / Self Care | Payer: Medicare PPO | Attending: Gastroenterology | Admitting: Gastroenterology

## 2023-08-26 ENCOUNTER — Encounter
Admission: RE | Disposition: A | Discharge status: Home / Self Care | Payer: Self-pay | Source: Home / Self Care | Attending: Gastroenterology

## 2023-08-26 ENCOUNTER — Other Ambulatory Visit: Payer: Self-pay

## 2023-08-26 ENCOUNTER — Encounter: Payer: Self-pay | Admitting: Gastroenterology

## 2023-08-26 DIAGNOSIS — K219 Gastro-esophageal reflux disease without esophagitis: Secondary | ICD-10-CM | POA: Diagnosis not present

## 2023-08-26 DIAGNOSIS — Z8719 Personal history of other diseases of the digestive system: Secondary | ICD-10-CM | POA: Insufficient documentation

## 2023-08-26 DIAGNOSIS — Z09 Encounter for follow-up examination after completed treatment for conditions other than malignant neoplasm: Secondary | ICD-10-CM | POA: Insufficient documentation

## 2023-08-26 DIAGNOSIS — K5730 Diverticulosis of large intestine without perforation or abscess without bleeding: Secondary | ICD-10-CM | POA: Diagnosis not present

## 2023-08-26 HISTORY — PX: COLONOSCOPY WITH PROPOFOL: SHX5780

## 2023-08-26 SURGERY — COLONOSCOPY WITH PROPOFOL
Anesthesia: General

## 2023-08-26 MED ORDER — PROPOFOL 10 MG/ML IV BOLUS
INTRAVENOUS | Status: AC
  Filled 2023-08-26: qty 40

## 2023-08-26 MED ORDER — SODIUM CHLORIDE 0.9 % IV SOLN: INTRAVENOUS | Status: DC

## 2023-08-26 MED ORDER — LIDOCAINE HCL (CARDIAC) PF 100 MG/5ML IV SOSY
PREFILLED_SYRINGE | INTRAVENOUS | Status: DC | PRN
  Administered 2023-08-26: 100 mg via INTRAVENOUS

## 2023-08-26 MED ORDER — LIDOCAINE HCL (PF) 2 % IJ SOLN
INTRAMUSCULAR | Status: AC
  Filled 2023-08-26: qty 5

## 2023-08-26 MED ORDER — PROPOFOL 10 MG/ML IV BOLUS
INTRAVENOUS | Status: DC | PRN
  Administered 2023-08-26: 140 ug/kg/min via INTRAVENOUS
  Administered 2023-08-26: 80 mg via INTRAVENOUS

## 2023-08-26 NOTE — Op Note (Signed)
[facility] Gastroenterology Patient Name: Patricia Oliver Procedure Date: 08/26/2023 8:52 AM MRN: [id number] Account #: [id number] Date of Birth: 04/12/1951 Admit Type: Outpatient Age: 73 Room: [facility] ENDO ROOM 1 Gender: Female Note Status: Finalized Instrument Name: Peds Colonoscope [id number] Procedure:             Colonoscopy Indications:           Follow-up of diverticulitis Providers:             Wyline Mood MD, MD Medicines:             Monitored Anesthesia Care Complications:         No immediate complications. Procedure:             Pre-Anesthesia Assessment:                        - Prior to the procedure, a History and Physical was                         performed, and patient medications, allergies and                         sensitivities were reviewed. The patient's tolerance                         of previous anesthesia was reviewed.                        - The risks and benefits of the procedure and the                         sedation options and risks were discussed with the                         patient. All questions were answered and informed                         consent was obtained.                        - ASA Grade Assessment: II - A patient with mild                         systemic disease.                        After obtaining informed consent, the colonoscope was                         passed under direct vision. Throughout the procedure,                         the patient's blood pressure, pulse, and oxygen                         saturations were monitored continuously. The                         Colonoscope was introduced through the anus with the  intention of advancing to the cecum. The scope was                         advanced to the sigmoid colon before the procedure was                         aborted. Medications were given. The colonoscopy was                         performed with ease. The patient  tolerated the                         procedure well. The quality of the bowel preparation                         was adequate. Findings:      The perianal and digital rectal examinations were normal.      Multiple medium-mouthed diverticula were found in the sigmoid colon.      Unable to go beytond 30 cm mark due to severe diverticulosis and tight       turn Impression:            - Diverticulosis in the sigmoid colon.                        - No specimens collected. Recommendation:        - Discharge patient to home (with escort).                        - Resume previous diet.                        - Continue present medications.                        - Perform a barium enema in 2 weeks.                        - Return to GI office in 6 weeks. Procedure Code(s):     --- Professional ---                        [phone], 53, Colonoscopy, flexible; diagnostic,                         including collection of specimen(s) by brushing or                         washing, when performed (separate procedure) Diagnosis Code(s):     --- Professional ---                        X91.47, Diverticulitis of large intestine without                         perforation or abscess without bleeding                        K57.30, Diverticulosis of large intestine without  perforation or abscess without bleeding CPT copyright 2022 American Medical Association. All rights reserved. The codes documented in this report are preliminary and upon coder review may  be revised to meet current compliance requirements. Wyline Mood, MD Wyline Mood MD, MD 08/26/2023 9:16:51 AM This report has been signed electronically. Number of Addenda: 0 Note Initiated On: 08/26/2023 8:52 AM Total Procedure Duration: 0 hours 6 minutes 31 seconds  Estimated Blood Loss:  Estimated blood loss: none.      [facility]

## 2023-08-26 NOTE — H&P (Signed)
Wyline Mood, MD [address], [address], [place], [place], [address] [address], [place], [place], [address] Phone: [phone]  Fax: [phone]  Primary Care Physician:  Kandyce Rud, MD   Pre-Procedure History & Physical: HPI:  Patricia Oliver is a 73 y.o. female is here for an colonoscopy.   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, right    Cataract cortical, senile, left    Colon polyp    COVID-19 02/26/2023   Diverticulitis 10/2022   Diverticulosis    Fibrocystic breast disease    GERD (gastroesophageal reflux disease)    Hyperlipidemia    Insomnia    Osteoarthritis of left knee    Osteopenia     Past Surgical History:  Procedure Laterality Date   ABDOMINAL HYSTERECTOMY  2001   APPENDECTOMY  1956   BREAST BIOPSY Left 1996   neg   BREAST BIOPSY Left 04/06/2021   stereo bx, ribbon clip, neg   CATARACT EXTRACTION W/ INTRAOCULAR LENS IMPLANT Left 01/2023   CATARACT EXTRACTION W/ INTRAOCULAR LENS IMPLANT Right 03/2023   COLONOSCOPY N/A 12/10/2014   Procedure: COLONOSCOPY;  Surgeon: Christena Deem, MD;  Location: [facility] ENDOSCOPY;  Service: Endoscopy;  Laterality: N/A;   COLONOSCOPY WITH PROPOFOL N/A 11/21/2020   Procedure: COLONOSCOPY WITH PROPOFOL;  Surgeon: Wyline Mood, MD;  Location: [facility] ENDOSCOPY;  Service: Gastroenterology;  Laterality: N/A;   EXCISION MASS UPPER EXTREMETIES Right 08/09/2021   Procedure: Excision of benign neoplasm right index PIP joint and repair of extensor tendon;  Surgeon: Christena Flake, MD;  Location: [facility] ORS;  Service: Orthopedics;  Laterality: Right;   MASS EXCISION Right 03/19/2023   Procedure: EXCISION OF RECURRENT SOFT TISSUE MASS OF RIGHT INDEX FINGER;  Surgeon: Christena Flake, MD;  Location: [facility] ORS;  Service: Orthopedics;  Laterality: Right;   ORIF ANKLE FRACTURE Right 2006   2 screws and pin   TOTAL KNEE ARTHROPLASTY Left 05/16/2023   Procedure:  TOTAL KNEE ARTHROPLASTY;  Surgeon: Christena Flake, MD;  Location: [facility] ORS;  Service: Orthopedics;  Laterality: Left;    Prior to Admission medications   Medication Sig Start Date End Date Taking? Authorizing Provider  apixaban (ELIQUIS) 2.5 MG TABS tablet Take 1 tablet (2.5 mg total) by mouth 2 (two) times daily. Patient not taking: Reported on 08/19/2023 05/17/23   Poggi, Excell Seltzer, MD  apixaban (ELIQUIS) 2.5 MG TABS tablet Take 1 tablet (2.5 mg total) by mouth 2 (two) times daily. Patient not taking: Reported on 08/19/2023 05/16/23     ascorbic acid (VITAMIN C) 500 MG tablet Take 500 mg by mouth daily.    [provider]  azelastine (ASTELIN) 0.1 % nasal spray Place 1 spray into both nostrils daily. 03/11/23     azelastine (OPTIVAR) 0.05 % ophthalmic solution Place 1 drop into the left eye daily. 05/16/23   Poggi, Excell Seltzer, MD  azelastine (OPTIVAR) 0.05 % ophthalmic solution Place 1 drop into both eyes daily. 06/24/23     Bacillus Coagulans-Inulin (VITAFUSION FIBER WELL/PRO PO) Take 1 tablet by mouth daily.    [provider]  Calcium Carbonate-Vit D-Min (QC CALCIUM/MINERALS/VITAMIN D) 600-400 MG-UNIT TABS Take 1 tablet by mouth 2 (two) times daily.    [provider]  celecoxib (CELEBREX) 200 MG capsule Take 1 capsule (200 mg total) by mouth 2 (two) times daily. 06/24/23     Cyanocobalamin (VITAMIN B-12) 5000 MCG LOZG  Take 1 tablet by mouth daily.    [provider]  dicyclomine (BENTYL) 10 MG capsule Take 1 capsule (10 mg total) by mouth 2 (two) times daily. 03/19/23   Poggi, Excell Seltzer, MD  estradiol (ESTRACE) 0.1 MG/GM vaginal cream Place 1 Applicatorful vaginally 2 (two) times a week. 03/21/23   Poggi, Excell Seltzer, MD  famotidine (PEPCID) 40 MG tablet Take 1 tablet (40 mg total) by mouth every morning. 03/19/23   Poggi, Excell Seltzer, MD  fluticasone Aleda Grana) 50 MCG/ACT nasal spray Place 2 sprays into both nostrils every morning. 03/19/23   Poggi, Excell Seltzer, MD   glucosamine-chondroitin 500-400 MG tablet Take 1 tablet by mouth daily.    [provider]  guaifenesin (HUMIBID E) 400 MG TABS tablet Take 400 mg by mouth every morning.    [provider]  montelukast (SINGULAIR) 10 MG tablet Take 1 tablet (10 mg total) by mouth at bedtime. 05/16/23   Poggi, Excell Seltzer, MD  montelukast (SINGULAIR) 10 MG tablet Take 1 tablet (10 mg total) by mouth daily. 06/24/23     oxyCODONE (OXY IR/ROXICODONE) 5 MG immediate release tablet Take 1-2 tablets (5-10 mg total) by mouth every 4 (four) hours as needed for moderate pain (pain score 4-6) or severe pain (pain score 7-10). Patient not taking: Reported on 08/19/2023 05/16/23     oxyCODONE (ROXICODONE) 5 MG immediate release tablet Take 1-2 tablets (5-10 mg total) by mouth every 4 (four) hours as needed for moderate pain (pain score 4-6) or severe pain (pain score 7-10). Patient not taking: Reported on 08/19/2023 05/16/23   Poggi, Excell Seltzer, MD  prednisoLONE acetate (PRED FORTE) 1 % ophthalmic suspension Place 1 drop into the left eye daily.    [provider]  prednisoLONE-Moxifloxacin 1-0.5 % SOLN Place 1 drop into the right eye 4 (four) times daily.    [provider]  RSV bivalent vaccine (ABRYSVO) 120 MCG/0.5ML injection Inject 0.5 mLs into the muscle. 08/16/23   Judyann Munson, MD  simvastatin (ZOCOR) 40 MG tablet Take 1 tablet (40 mg total) by mouth once daily Patient taking differently: Take 40 mg by mouth at bedtime. 02/27/21     simvastatin (ZOCOR) 40 MG tablet Take 1 tablet (40 mg total) by mouth daily. 06/24/23     vitamin E 180 MG (400 UNITS) capsule Take 400 Units by mouth daily.    [provider]  zolpidem (AMBIEN) 5 MG tablet Take 1 tablet (5 mg total) by mouth at bedtime as needed for sleep. 05/16/23 11/12/23  Poggi, Excell Seltzer, MD    Allergies as of 02/27/2023 - Review Complete 02/20/2023  Allergen Reaction Noted   Morphine Nausea And Vomiting and Other (See Comments)  11/27/2022   Ciprofloxacin Swelling and Rash 12/01/2014   Elemental sulfur Itching, Swelling, and Rash 12/01/2014   Sulfa antibiotics Rash 11/19/2013   Sulfur Itching, Rash, and Swelling 12/01/2014    Family History  Problem Relation Age of Onset   Breast cancer Sister 84   Breast cancer Paternal Aunt    Breast cancer Paternal Aunt    Breast cancer Sister 64       dx twice with breast ca    Social History   Socioeconomic History   Marital status: Married    Spouse name: Deniece Portela   Number of children: 1   Years of education: Not on file   Highest education level: Not on file  Occupational History   Not on file  Tobacco Use   Smoking  status: Never   Smokeless tobacco: Never  Vaping Use   Vaping status: Never Used  Substance and Sexual Activity   Alcohol use: Yes    Comment: rare   Drug use: Never   Sexual activity: Not on file  Other Topics Concern   Not on file  Social History Narrative   Not on file   Social Drivers of Health   Financial Resource Strain: Not on file  Food Insecurity: No Food Insecurity (05/16/2023)   Hunger Vital Sign    Worried About Running Out of Food in the Last Year: Never true    Ran Out of Food in the Last Year: Never true  Transportation Needs: No Transportation Needs (05/16/2023)   PRAPARE - Administrator, Civil Service (Medical): No    Lack of Transportation (Non-Medical): No  Physical Activity: Not on file  Stress: Not on file  Social Connections: Not on file  Intimate Partner Violence: Not At Risk (05/16/2023)   Humiliation, Afraid, Rape, and Kick questionnaire    Fear of Current or Ex-Partner: No    Emotionally Abused: No    Physically Abused: No    Sexually Abused: No    Review of Systems: See HPI, otherwise negative ROS  Physical Exam: There were no vitals taken for this visit. General:   Alert,  pleasant and cooperative in NAD Head:  Normocephalic and atraumatic. Neck:  Supple; no masses or  thyromegaly. Lungs:  Clear throughout to auscultation, normal respiratory effort.    Heart:  +S1, +S2, Regular rate and rhythm, No edema. Abdomen:  Soft, nontender and nondistended. Normal bowel sounds, without guarding, and without rebound.   Neurologic:  Alert and  oriented x4;  grossly normal neurologically.  Impression/Plan: Patricia Oliver is here for an colonoscopy to be performed for recent history of diverticulitis Risks, benefits, limitations, and alternatives regarding  colonoscopy have been reviewed with the patient.  Questions have been answered.  All parties agreeable.   Wyline Mood, MD  08/26/2023, 8:27 AM

## 2023-08-26 NOTE — Anesthesia Preprocedure Evaluation (Signed)
Anesthesia Evaluation  Patient identified by MRN, date of birth, ID band Patient awake    Reviewed: Allergy & Precautions, H&P , NPO status , Patient's Chart, lab work & pertinent test results, reviewed documented beta blocker date and time   History of Anesthesia Complications Negative for: history of anesthetic complications  Airway Mallampati: II  TM Distance: >3 FB Neck ROM: full    Dental  (+) Teeth Intact   Pulmonary neg pulmonary ROS   Pulmonary exam normal        Cardiovascular Exercise Tolerance: Good negative cardio ROS Normal cardiovascular exam Rate:Normal     Neuro/Psych negative neurological ROS  negative psych ROS   GI/Hepatic Neg liver ROS,GERD  Medicated,,  Endo/Other  negative endocrine ROS    Renal/GU      Musculoskeletal   Abdominal   Peds  Hematology negative hematology ROS (+)   Anesthesia Other Findings   Reproductive/Obstetrics negative OB ROS                             Anesthesia Physical Anesthesia Plan  ASA: 2  Anesthesia Plan: General   Post-op Pain Management:    Induction: Intravenous  PONV Risk Score and Plan: 3 and Propofol infusion, TIVA and Treatment may vary due to age or medical condition  Airway Management Planned: Natural Airway and Nasal Cannula  Additional Equipment:   Intra-op Plan:   Post-operative Plan:   Informed Consent: I have reviewed the patients History and Physical, chart, labs and discussed the procedure including the risks, benefits and alternatives for the proposed anesthesia with the patient or authorized representative who has indicated his/her understanding and acceptance.     Dental Advisory Given  Plan Discussed with: Anesthesiologist, CRNA and Surgeon  Anesthesia Plan Comments: (Patient consented for risks of anesthesia including but not limited to:  - adverse reactions to medications - damage to eyes,  teeth, lips or other oral mucosa - nerve damage due to positioning  - sore throat or hoarseness - Damage to heart, brain, nerves, lungs, other parts of body or loss of life  Patient voiced understanding and assent.)       Anesthesia Quick Evaluation

## 2023-08-26 NOTE — Transfer of Care (Signed)
Immediate Anesthesia Transfer of Care Note  Patient: Patricia Oliver  Procedure(s) Performed: COLONOSCOPY WITH PROPOFOL  Patient Location: Endoscopy Unit  Anesthesia Type:General  Level of Consciousness: drowsy  Airway & Oxygen Therapy: Patient Spontanous Breathing  Post-op Assessment: Report given to RN and Post -op Vital signs reviewed and stable  Post vital signs: Reviewed and stable  Last Vitals:  Vitals Value Taken Time  BP 126/88 08/26/23 0918  Temp 36.1 C 08/26/23 0918  Pulse 76 08/26/23 0918  Resp 18 08/26/23 0918  SpO2 97 % 08/26/23 0918    Last Pain:  Vitals:   08/26/23 0918  TempSrc: Temporal  PainSc: Asleep         Complications: No notable events documented.

## 2023-08-27 NOTE — Anesthesia Postprocedure Evaluation (Signed)
 Anesthesia Post Note  Patient: Patricia Oliver  Procedure(s) Performed: COLONOSCOPY WITH PROPOFOL   Patient location during evaluation: Endoscopy Anesthesia Type: General Level of consciousness: awake and alert Pain management: pain level controlled Vital Signs Assessment: post-procedure vital signs reviewed and stable Respiratory status: spontaneous breathing, nonlabored ventilation, respiratory function stable and patient connected to nasal cannula oxygen Cardiovascular status: blood pressure returned to baseline and stable Postop Assessment: no apparent nausea or vomiting Anesthetic complications: no   No notable events documented.   Last Vitals:  Vitals:   08/26/23 0928 08/26/23 0938  BP: (!) 134/91 (!) 155/105  Pulse: 79   Resp: 16   Temp:    SpO2: 100%     Last Pain:  Vitals:   08/26/23 0938  TempSrc:   PainSc: 0-No pain                 Prentice Murphy

## 2023-08-28 ENCOUNTER — Encounter: Payer: Self-pay | Admitting: Gastroenterology

## 2023-08-30 ENCOUNTER — Telehealth: Payer: Self-pay

## 2023-08-30 DIAGNOSIS — Z8719 Personal history of other diseases of the digestive system: Secondary | ICD-10-CM

## 2023-08-30 NOTE — Telephone Encounter (Signed)
 Patient needs a barium enema scheduled.

## 2023-08-30 NOTE — Telephone Encounter (Signed)
-----   Message from Luke Salaam sent at 08/26/2023 12:39 PM EST ----- Regarding: Incomplete colonoscopy Please schedule  barium enema due to incomplete colonoscopy

## 2023-09-02 ENCOUNTER — Other Ambulatory Visit: Payer: Self-pay

## 2023-09-02 MED ORDER — PEG 3350-KCL-NA BICARB-NACL 420 G PO SOLR
ORAL | 0 refills | Status: AC
  Filled 2023-09-02: qty 4000, 1d supply, fill #0

## 2023-09-02 NOTE — Telephone Encounter (Signed)
 Called patient to let her know that Dr. Antony Baumgartner wanted her to have a barium enema and that it was scheduled for this week on 09/05/2023 and for [facility] to [facility] at 9:15 AM. Patient was provided with instructions.

## 2023-09-04 ENCOUNTER — Other Ambulatory Visit: Payer: Self-pay

## 2023-09-05 ENCOUNTER — Telehealth: Payer: Self-pay

## 2023-09-05 ENCOUNTER — Ambulatory Visit
Admission: RE | Admit: 2023-09-05 | Discharge: 2023-09-05 | Disposition: A | Discharge status: Home / Self Care | Payer: Medicare PPO | Source: Ambulatory Visit | Attending: Gastroenterology | Admitting: Gastroenterology

## 2023-09-05 DIAGNOSIS — Z8719 Personal history of other diseases of the digestive system: Secondary | ICD-10-CM | POA: Diagnosis present

## 2023-09-05 DIAGNOSIS — K6389 Other specified diseases of intestine: Secondary | ICD-10-CM

## 2023-09-05 MED ORDER — IOHEXOL 300 MG/ML  SOLN
450.0000 mL | Freq: Once | INTRAMUSCULAR | Status: AC | PRN
  Administered 2023-09-05: 450 mL

## 2023-09-05 NOTE — Telephone Encounter (Signed)
French Ana with [facility] radiology is calling about the Barium enema result that patient had done today.  The Impression:  IMPRESSION: 1. A filling defect is questioned within the cecum on multiple images. Virtual colonoscopy/CT colonography recommended to exclude a mass or polyp at this site. 2. Prominent diverticulosis of the sigmoid colon, and to a lesser degree, of the descending colon. 3. Redundant sigmoid colon.

## 2023-09-06 ENCOUNTER — Other Ambulatory Visit: Payer: Self-pay

## 2023-09-11 ENCOUNTER — Other Ambulatory Visit: Payer: Self-pay

## 2023-09-18 NOTE — Telephone Encounter (Signed)
 Referral to [facility] imaging had been sent for them to call patient and schedule a CT virtual for the patient. Patient will be notified.

## 2023-09-18 NOTE — Addendum Note (Signed)
 Addended by: Adela Ports on: 09/18/2023 07:22 PM   Modules accepted: Orders

## 2023-10-16 ENCOUNTER — Other Ambulatory Visit: Payer: Self-pay

## 2023-10-22 ENCOUNTER — Ambulatory Visit
Admission: RE | Admit: 2023-10-22 | Discharge: 2023-10-22 | Disposition: A | Discharge status: Home / Self Care | Payer: Medicare PPO | Source: Ambulatory Visit | Attending: Gastroenterology | Admitting: Gastroenterology

## 2023-10-22 DIAGNOSIS — K6389 Other specified diseases of intestine: Secondary | ICD-10-CM

## 2023-11-05 ENCOUNTER — Other Ambulatory Visit: Payer: Self-pay

## 2023-11-05 MED ORDER — AMOXICILLIN 500 MG PO CAPS
500.0000 mg | ORAL_CAPSULE | ORAL | 4 refills | Status: AC
  Filled 2023-11-05: qty 4, 4d supply, fill #0

## 2023-11-26 ENCOUNTER — Other Ambulatory Visit: Payer: Self-pay

## 2023-11-26 MED ORDER — FAMOTIDINE 40 MG PO TABS
40.0000 mg | ORAL_TABLET | Freq: Two times a day (BID) | ORAL | 1 refills | Status: AC
  Filled 2023-11-26: qty 180, 90d supply, fill #0

## 2023-12-24 ENCOUNTER — Other Ambulatory Visit: Payer: Self-pay

## 2023-12-24 MED ORDER — SIMVASTATIN 40 MG PO TABS
40.0000 mg | ORAL_TABLET | Freq: Every day | ORAL | 3 refills | Status: AC
  Filled 2023-12-24: qty 90, 90d supply, fill #0

## 2023-12-24 MED ORDER — MONTELUKAST SODIUM 10 MG PO TABS
10.0000 mg | ORAL_TABLET | Freq: Every day | ORAL | 3 refills | Status: AC
  Filled 2023-12-24: qty 90, 90d supply, fill #0
  Filled 2024-04-02: qty 90, 90d supply, fill #1

## 2023-12-26 ENCOUNTER — Other Ambulatory Visit: Payer: Self-pay

## 2023-12-26 MED ORDER — METOPROLOL SUCCINATE ER 25 MG PO TB24
12.5000 mg | ORAL_TABLET | Freq: Every day | ORAL | 0 refills | Status: DC
  Filled 2023-12-26: qty 15, 30d supply, fill #0

## 2024-01-09 ENCOUNTER — Other Ambulatory Visit: Payer: Self-pay

## 2024-01-09 MED ORDER — CELECOXIB 200 MG PO CAPS
200.0000 mg | ORAL_CAPSULE | Freq: Two times a day (BID) | ORAL | 1 refills | Status: AC
  Filled 2024-01-09: qty 180, 90d supply, fill #0

## 2024-01-13 ENCOUNTER — Other Ambulatory Visit: Payer: Self-pay

## 2024-01-13 MED ORDER — PREDNISONE 10 MG PO TABS
ORAL_TABLET | ORAL | 0 refills | Status: AC
  Filled 2024-01-13: qty 21, 6d supply, fill #0

## 2024-01-20 ENCOUNTER — Other Ambulatory Visit: Payer: Self-pay

## 2024-01-20 MED ORDER — FLUTICASONE PROPIONATE 50 MCG/ACT NA SUSP
2.0000 | Freq: Every day | NASAL | 1 refills | Status: AC
  Filled 2024-01-20: qty 48, 90d supply, fill #0
  Filled 2024-08-27: qty 48, 90d supply, fill #1

## 2024-02-10 ENCOUNTER — Other Ambulatory Visit: Payer: Self-pay

## 2024-02-10 MED ORDER — FAMOTIDINE 40 MG PO TABS
40.0000 mg | ORAL_TABLET | Freq: Two times a day (BID) | ORAL | 1 refills | Status: AC | PRN
  Filled 2024-02-10: qty 180, 90d supply, fill #0

## 2024-02-10 MED ORDER — ESTRADIOL 0.1 MG/GM VA CREA
1.0000 g | TOPICAL_CREAM | VAGINAL | 11 refills | Status: AC
  Filled 2024-02-10: qty 42.5, 30d supply, fill #0

## 2024-02-10 MED ORDER — ZOLPIDEM TARTRATE 5 MG PO TABS
5.0000 mg | ORAL_TABLET | Freq: Every evening | ORAL | 0 refills | Status: AC | PRN
  Filled 2024-02-10: qty 30, 30d supply, fill #0

## 2024-02-10 MED ORDER — AZELASTINE HCL 0.05 % OP SOLN
1.0000 [drp] | Freq: Every day | OPHTHALMIC | 5 refills | Status: AC
  Filled 2024-02-10: qty 6, 60d supply, fill #0
  Filled 2024-06-16: qty 6, 60d supply, fill #1
  Filled 2024-08-27: qty 6, 60d supply, fill #2

## 2024-02-10 MED ORDER — SCOPOLAMINE 1 MG/3DAYS TD PT72
1.0000 | MEDICATED_PATCH | TRANSDERMAL | 0 refills | Status: AC
  Filled 2024-02-10: qty 10, 30d supply, fill #0

## 2024-02-14 ENCOUNTER — Other Ambulatory Visit: Payer: Self-pay | Admitting: Neurology

## 2024-02-14 DIAGNOSIS — R42 Dizziness and giddiness: Secondary | ICD-10-CM

## 2024-02-17 ENCOUNTER — Ambulatory Visit
Admission: RE | Admit: 2024-02-17 | Discharge: 2024-02-17 | Disposition: A | Discharge status: Home / Self Care | Source: Ambulatory Visit | Attending: Neurology | Admitting: Neurology

## 2024-02-17 DIAGNOSIS — R42 Dizziness and giddiness: Secondary | ICD-10-CM | POA: Insufficient documentation

## 2024-02-20 ENCOUNTER — Other Ambulatory Visit: Payer: Self-pay

## 2024-02-20 ENCOUNTER — Encounter: Payer: Self-pay | Admitting: Physical Therapy

## 2024-02-20 ENCOUNTER — Ambulatory Visit: Attending: Neurology | Admitting: Physical Therapy

## 2024-02-20 DIAGNOSIS — R42 Dizziness and giddiness: Secondary | ICD-10-CM | POA: Diagnosis present

## 2024-02-20 DIAGNOSIS — R2681 Unsteadiness on feet: Secondary | ICD-10-CM | POA: Insufficient documentation

## 2024-02-20 DIAGNOSIS — R2689 Other abnormalities of gait and mobility: Secondary | ICD-10-CM | POA: Insufficient documentation

## 2024-02-20 DIAGNOSIS — M542 Cervicalgia: Secondary | ICD-10-CM | POA: Diagnosis present

## 2024-02-20 NOTE — Therapy (Signed)
 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, right    Cataract cortical, senile, left    Colon polyp    COVID-19 02/26/2023   Diverticulitis 10/2022   Diverticulosis    Fibrocystic breast disease    GERD (gastroesophageal reflux disease)    Hyperlipidemia    Insomnia    Osteoarthritis of left knee    Osteopenia    Past Surgical History:  Procedure Laterality Date   ABDOMINAL HYSTERECTOMY  2001   APPENDECTOMY  1956   BREAST BIOPSY Left 1996   neg   BREAST BIOPSY Left 04/06/2021   stereo bx, ribbon clip, neg   CATARACT EXTRACTION W/ INTRAOCULAR LENS IMPLANT Left 01/2023   CATARACT EXTRACTION W/ INTRAOCULAR LENS IMPLANT Right 03/2023   COLONOSCOPY N/A 12/10/2014   Procedure: COLONOSCOPY;  Surgeon: Gladis RAYMOND Mariner, MD;  Location: [facility] ENDOSCOPY;  Service: Endoscopy;  Laterality: N/A;   COLONOSCOPY WITH PROPOFOL  N/A 11/21/2020   Procedure: COLONOSCOPY WITH PROPOFOL ;  Surgeon: Therisa Bi, MD;  Location: [facility] ENDOSCOPY;  Service: Gastroenterology;  Laterality: N/A;   COLONOSCOPY WITH PROPOFOL  N/A 08/26/2023   Procedure: COLONOSCOPY WITH PROPOFOL ;  Surgeon: Therisa Bi, MD;  Location: [facility] ENDOSCOPY;  Service: Gastroenterology;  Laterality: N/A;   EXCISION MASS UPPER EXTREMETIES Right 08/09/2021   Procedure: Excision of benign neoplasm right index PIP joint and repair of extensor  tendon;  Surgeon: Edie Norleen PARAS, MD;  Location: [facility] ORS;  Service: Orthopedics;  Laterality: Right;   MASS EXCISION Right 03/19/2023   Procedure: EXCISION OF RECURRENT SOFT TISSUE MASS OF RIGHT INDEX FINGER;  Surgeon: Edie Norleen PARAS, MD;  Location: [facility] ORS;  Service: Orthopedics;  Laterality: Right;   ORIF ANKLE FRACTURE Right 2006   2 screws and pin   TOTAL KNEE ARTHROPLASTY Left 05/16/2023   Procedure: TOTAL KNEE ARTHROPLASTY;  Surgeon: Edie Norleen PARAS, MD;  Location: [facility] ORS;  Service: Orthopedics;  Laterality: Left;   Patient Active Problem List   Diagnosis Date Noted   History of diverticulitis 08/26/2023   Status post total knee replacement using cement, left 05/16/2023   Diverticulitis of large intestine with abscess without bleeding 10/29/2022   Diverticulitis 10/26/2022   Insomnia 10/26/2022   Abdominal pain 10/26/2022   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, right 07/21/2021   Change in bowel habits 10/26/2020   Primary osteoarthritis of left knee 04/30/2018   Allergic rhinitis 11/19/2013   Esophageal reflux 11/19/2013   HLD (hyperlipidemia) 11/19/2013    PCP: Diedra Lame, MD REFERRING PROVIDER: Maree Jannett POUR, MD   REFERRING DIAG: R42 (ICD-10-CM) - Dizziness   THERAPY DIAG:  Dizziness and giddiness  Imbalance  Cervicalgia  Unsteadiness on feet  ONSET DATE: roughtly, June 1st 2025  Rationale for Evaluation and Treatment: Rehabilitation  SUBJECTIVE:   SUBJECTIVE STATEMENT:  Pt states she started having dizziness ~6 weeks ago and her provider initially thought it was her BP so she was put on BP medications, but that didn't resolve her symptoms so she was referred to ENT who did VNG testing, which she said caused significant symptoms on L side with the warm air (reports she could feel her eyes moving quickly), but otherwise no significant findings. Then states she had an MRI and she was told she had a mucous cyst in L sinus, which may be  contributing/causing her dizziness. Pt states symptoms are primarily on her LEFT.  Findings from MRI:  Sinuses/Orbits: No mass or acute finding within the imaged orbits. Mild mucosal thickening within the right maxillary sinus. Small mucous retention cyst within a posterior left ethmoid air cell.  Pt states she feels dizzy and nauseous with symptoms worse in the morning when she gets up. Pt states in the beginning she was taking meclizine, which helped a little, but not significantly. Pt states usually when the seasons change her sinuses act up (but onset of dizziness did not occur during a seasonal change). Pt states she has sinus issues with tree allergies and so she takes medication for that year around. Pt states she called her PCP PA a couple of weeks after symptom onset.   Pt denies having any change in activities and no illness preceding the sudden onset of her dizziness symptoms. Pt states one morning she was fixing breakfast and felt something wasn't normal and pt states she thought it was her allergies, but then it just kept progressing. Pt states it isn't as bad now as it was and it is mostly in the morning. Pt states her symptoms haven't been as bad since this VNG testing. Pt states when she opens her mouth she is a little tender on that side.   Pt also reports she carries her stress in her shoulders and was told by MD she has some knots back there. Pt states she has had dry needling before for L piriformis and said it was amazing at helping. Pt states she mostly sleeps on her R side, but tosses/turns a lot. Pt states she has had a chronic nagging headache since the onset of the dizziness and she is not someone who has hx of headaches.   Of note, pt had bilateral ptosis surgery ~3 months ago in May.   Pt states she has a follow-up appointment with Dr. Maree in October, but may call to have that moved sooner.  Pt states she doesn't feel like she is going to fall and that she  does feel safe driving.   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 Differential diagnosis includes vestibular migraines, late-life migraine accompaniments, cervicogenic dizziness, orthostatic hypotension. Previous ENT evaluation and VNG were normal. Symptoms are not consistent with typical migraines.  - Order MRI of the head to rule out secondary causes - Order TSH, Vit B12, Vit D. (These labs are done to rule out systemic causes of neurological symptoms.) - Initiate vestibular rehabilitation therapy. -  Magnesium : Recommend beginning Magnesium  citrate or glycinate supplements 400-600 mg per day. Magnesium  can be used as preventive treatment for migraine and other types of headaches   Cervical myofascial pain Tenderness in cervical region with palpable trigger points. Dull headaches may originate from the neck, indicating cervical myofascial pain. - Include physical therapy for cervical myofascial pain in vestibular rehabilitation. - Recommend warm compresses, stretching, and massage for symptom relief. - Consider trigger point injections with Kenalog  and lidocaine  if conservative measures are ineffective.  She has experienced episodes of dizziness and mild nausea for six weeks, primarily in the mornings after showering. The dizziness is described as 'swimmy headed' without a sensation of falling. She also has a chronic light headache, a nagging pain at the back of her neck and the top of her head. Occasionally, she experiences brief episodes  of blurred vision.   An ENT evaluation, including a VNG test, showed a violent reaction on the left side but was otherwise normal. She has tried meclizine in the past for similar symptoms related to allergies, but it did not resolve her current symptoms.     PAIN:  Are you having pain? Yes: NPRS scale: 1/10 Pain location: L ear Pain description: ache Aggravating factors: worse in the morning Relieving factors: none known  PRECAUTIONS: Fall  RED FLAGS: None   WEIGHT BEARING RESTRICTIONS: No  FALLS: Has patient fallen in last 6 months? No  LIVING ENVIRONMENT: Lives with: lives with their spouse Lives in: House/apartment Stairs: No Has following equipment at home: None  PLOF: Independent  PATIENT GOALS: Get rid of the dizziness  OBJECTIVE:  Note: Objective measures were completed at Evaluation unless otherwise noted.  DIAGNOSTIC FINDINGS:   EXAM: MRI HEAD WITHOUT CONTRAST   FINDINGS: Brain:   No age-advanced or lobar predominant cerebral atrophy.   Multifocal T2 FLAIR hyperintense signal abnormality within the cerebral white matter, nonspecific but compatible with mild chronic small vessel ischemic disease.   There is no acute infarct.   Vascular: Maintained flow voids within the proximal large arterial vessels.   Skull and upper cervical spine: No focal worrisome marrow lesion.   Sinuses/Orbits: No mass or acute finding within the imaged orbits. Mild mucosal thickening within the right maxillary sinus. Small mucous retention cyst within a posterior left ethmoid air cell.   IMPRESSION: 1. No evidence of an acute intracranial abnormality. 2. Mild chronic small vessel ischemic changes within the cerebral white matter. 3. Mild paranasal sinus disease as described.     Electronically Signed   By: Rockey Childs D.O.   On: 02/18/2024 08:22  COGNITION: Overall cognitive status: Within functional limits for tasks assessed   SENSATION: Not  tested  EDEMA:  Not formally assessed  MUSCLE TONE:  Not formally assessed  DTRs:  Not formally assessed  POSTURE:  No Significant postural limitations  Cervical ROM:    Active  need to formally assess A/PROM (deg) eval  Flexion   Extension Decreased   Right lateral flexion   Left lateral flexion   Right rotation Decreased compared to L  Left rotation   (Blank rows = not tested)  STRENGTH: Not formally assessed  LOWER EXTREMITY MMT:   MMT  Not formally assessed, but functional for independent mobility without AD Right eval Left eval  Hip flexion    Hip abduction    Hip adduction    Hip internal rotation    Hip external rotation    Knee flexion    Knee extension  Ankle dorsiflexion    Ankle plantarflexion    Ankle inversion    Ankle eversion    (Blank rows = not tested)  BED MOBILITY:  Not formally assessed, but denies impairments with this  TRANSFERS: Assistive device utilized: None  Sit to stand: Complete Independence Stand to sit: Complete Independence Chair to chair: Complete Independence Floor: Not assessed  RAMP: Not assessed  CURB: Not assessed  GAIT: Gait pattern: step through pattern, decreased arm swing- Right, decreased arm swing- Left, decreased step length- Right, decreased step length- Left, and decreased stride length Distance walked: clinic distances Assistive device utilized: None Level of assistance: Complete Independence Comments: N/A  FUNCTIONAL TESTS:  MCTSIB: need to assess  PATIENT SURVEYS:   Neck Disability Index (NDI): need to assess  DHI: THE DIZZINESS HANDICAP INVENTORY (DHI)  P1. Does looking up increase your problem? 0 = No  E2. Because of your problem, do you feel frustrated? 0 = No  F3. Because of your problem, do you restrict your travel for business or recreation?  0 = No  P4. Does walking down the aisle of a supermarket increase your problems?  0 = No  F5. Because of your problem, do you have  difficulty getting into or out of bed?  0 = No  F6. Does your problem significantly restrict your participation in social activities, such as going out to dinner, going to the movies, dancing, or going to parties? 0 = No  F7. Because of your problem, do you have difficulty reading?  2 = Sometimes  P8. Does performing more ambitious activities such as sports, dancing, household chores (sweeping or putting dishes away) increase your problems?  0 = No  E9. Because of your problem, are you afraid to leave your home without having without having someone accompany you?  0 = No  E10. Because of your problem have you been embarrassed in front of others?  0 = No  P11. Do quick movements of your head increase your problem?  2 = Sometimes  F12. Because of your problem, do you avoid heights?  0 = No  P13. Does turning over in bed increase your problem?  0 = No  F14. Because of your problem, is it difficult for you to do strenuous homework or yard work? 0 = No  E15. Because of your problem, are you afraid people may think you are intoxicated? 0 = No  F16. Because of your problem, is it difficult for you to go for a walk by yourself?  2 = Sometimes  P17. Does walking down a sidewalk increase your problem?  0 = No  E18.Because of your problem, is it difficult for you to concentrate 0 = No  F19. Because of your problem, is it difficult for you to walk around your house in the dark? 0 = No  E20. Because of your problem, are you afraid to stay home alone?  0 = No  E21. Because of your problem, do you feel handicapped? 0 = No  E22. Has the problem placed stress on your relationships with members of your family or friends? 0 = No  E23. Because of your problem, are you depressed?  0 = No  F24. Does your problem interfere with your job or household responsibilities?  0 = No  P25. Does bending over increase your problem?  2 = Sometimes  TOTAL 8    DHI Scoring Instructions  The patient is asked to answer each  question as it pertains  to dizziness or unsteadiness problems, specifically  considering their condition during the last month. Questions are designed to incorporate functional (F), physical  (P), and emotional (E) impacts on disability.   Scores greater than 10 points should be referred to balance specialists for further evaluation.   16-34 Points (mild handicap)  36-52 Points (moderate handicap)  54+ Points (severe handicap)  Minimally Detectable Change: 17 points ([address] [place], 1990)  [place], G. SHAUNNA. and [place], C. W. (1990). The development of the Dizziness Handicap Inventory. Archives of Otolaryngology - Head and Neck Surgery  116(4): [id number].   VESTIBULAR ASSESSMENT:   SYMPTOM BEHAVIOR:  Subjective history: details above - onset ~6 weeks ago with no provocating event  Non-Vestibular symptoms: neck pain, headaches, and nausea/vomiting  Type of dizziness: Blurred Vision and fullness feeling  Frequency: daily, worse in the morning  Duration: constant  Aggravating factors: No known aggravating factors and Worse in the morning  Relieving factors: no known relieving factors  Progression of symptoms: slightly better following VNG testing  OCULOMOTOR EXAM:  Ocular Alignment: normal  Ocular ROM: No Limitations  Spontaneous Nystagmus: absent  Gaze-Induced Nystagmus: absent  Smooth Pursuits: intact  Saccades: slow and but otherwise WNL  Convergence/Divergence: L eye abducts during convergence testing  Cover/Cross-Cover: WNL Pt hx of astigmatism   VESTIBULAR - OCULAR REFLEX:   Slow VOR: Normal  VOR Cancellation: Comment: initially thought there may have been corrective saccades, but after repeat testing appears WNL  Head-Impulse Test: HIT Right: positive HIT Left: positive  Dynamic Visual Acuity: Not able to be assessed   POSITIONAL TESTING: Other: Not prioritized this date based on patient subjective not indicating positional involvement to vestibular symptoms;  however, may test at future visit  MOTION SENSITIVITY:  Motion Sensitivity Quotient Intensity: 0 = none, 1 = Lightheaded, 2 = Mild, 3 = Moderate, 4 = Severe, 5 = Vomiting  Intensity  1. Sitting to supine   2. Supine to L side   3. Supine to R side   4. Supine to sitting   5. L Hallpike-Dix   6. Up from L    7. R Hallpike-Dix   8. Up from R    9. Sitting, head tipped to L knee   10. Head up from L knee   11. Sitting, head tipped to R knee   12. Head up from R knee   13. Sitting head turns x5   14.Sitting head nods x5   15. In stance, 180 turn to L    16. In stance, 180 turn to R     OTHOSTATICS: not done; will check at future visit                                                                                                                             TREATMENT DATE: 02/20/2024  Initiated the below HEP targeting convergence and VOR gaze stabilization impairments noted on exam.   PATIENT EDUCATION: Education details: Balance  systems, Vestibular system including peripheral vs central, findings of assessment, PT POC Person educated: Patient Education method: Explanation and Handouts Education comprehension: verbalized understanding and needs further education  HOME EXERCISE PROGRAM:  Access Code: [email or web address] URL: [email or web address] Date: 02/20/2024 Prepared by: Connell Kiss  Exercises - Pencil Pushups  - 1 x daily - 7 x weekly - 3 sets - 10 reps - Seated Gaze Stabilization with Head Rotation  - 1 x daily - 7 x weekly - 3 sets - 10 reps - Seated Gaze Stabilization with Head Nod  - 1 x daily - 7 x weekly - 3 sets - 10 reps  GOALS: Goals reviewed with patient? Yes  SHORT TERM GOALS: Target date: 04/02/2024  Patient will be independent in home exercise program to improve gaze stabilization and/or mobility for improved functional independence with ADLs while experiencing decreased dizziness symptoms. Baseline: initiated on 02/20/2024 Goal status:  INITIAL  LONG TERM GOALS: Target date: 05/14/2024   Patient will report at least a 50% reduction in dizziness symptoms to indicate increased ease with ADLs and improved QOL.  Baseline: symptoms present every morning (100% of the days) Goal status: INITIAL  2.  Patient will demonstrate improvement in convergence assessment by at least 2mm indicating improved ability to fix gaze on near objects and improve ability to participate in reading activities for work. Baseline: convergence distance to be measured, pt reports sometimes difficulty reading due to problem Goal status: INITIAL  3.  Patient (> 33 years old) will improve cervical ROM by 10 degrees in the planes lacking full motion, without increase in pain, to indicate improved cervical mobility with daily tasks  Baseline: need to formally assess Goal status: INITIAL  4.  Patient will reduce Neck Disability Index score to <10% to demonstrate minimal disability with ADL's including improved sleeping tolerance, sitting tolerance, etc for better mobility at home and work. Baseline: need to assess Goal status: INITIAL   ASSESSMENT:  CLINICAL IMPRESSION: Patient is a 73 y.o. female who was seen today for physical therapy evaluation and treatment for dizziness/nausea that onset ~6 weeks ago with no known cause. Patient has been seen by her PCP, ENT, and Neurology with findings of significant response to VNG test applying warm air to L ear and mucous cyst in left ethmoid air cell on MRI. Patient planning to have ENT notes faxed to therapist for review. Patient states symptoms are most prominent in the morning and that she has had a slight improvement in symptoms following VNG testing. Patient also reports she carries a lot of tension in her neck and has also had onset of a chronic nagging headache, but denies a hx of headaches/migraines. Patient did demonstrates impaired convergence as well as positive HIT testing bilaterally. Therapist  initiated gaze stabilization exercises at this time with plan for further assessments as described below at next session. The pt will benefit from further skilled PT to improve dizziness and balance in order to return pt to PLOF an increase QOL.   OBJECTIVE IMPAIRMENTS: decreased activity tolerance, decreased balance, decreased knowledge of condition, decreased mobility, decreased ROM, dizziness, and increased fascial restrictions.   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   REHAB POTENTIAL: Good  CLINICAL DECISION MAKING: Evolving/moderate complexity  EVALUATION COMPLEXITY: Moderate   PLAN:  PT FREQUENCY: 1-2x/week  PT DURATION: 12 weeks  PLANNED INTERVENTIONS: 97164- PT Re-evaluation, 97750- Physical Performance Testing, 97110-Therapeutic exercises, 97530- Therapeutic activity, [id number]- Neuromuscular re-education, 97535- Self Care, [id number]- Manual therapy, [phone]- Gait training, [phone]- Canalith repositioning, [id number]- Electrical stimulation (manual), [phone] (1-2 muscles), [id number] (3+ muscles)- Dry Needling, Patient/Family education, Balance training, Stair training, Taping, Joint mobilization, Spinal mobilization, Vestibular training, Visual/preceptual remediation/compensation, Cryotherapy, Moist heat, and Biofeedback  PLAN FOR NEXT SESSION:  *Pt planning to have ENT notes faxed for therapist to review - test modified CTSIB - test orthostatic hypotension - complete Neck Disability Index (NDI)  questionnaire - test dynamic visual acuity (DVA) when/if appropriate - formally measure convergence - perform further cervical evaluation & start treatment  (AROM assessment) - manual therapy - dry needling with certified therapist - gaze stabilization exercises - assess TMJ if needed   Ryan Palermo, PT, DPT, NCS, CSRS Physical Therapist - [facility]  [facility]  2:49 PM 02/20/24

## 2024-02-25 ENCOUNTER — Ambulatory Visit: Attending: Neurology | Admitting: Physical Therapy

## 2024-02-25 DIAGNOSIS — M542 Cervicalgia: Secondary | ICD-10-CM | POA: Diagnosis present

## 2024-02-25 DIAGNOSIS — R42 Dizziness and giddiness: Secondary | ICD-10-CM | POA: Insufficient documentation

## 2024-02-25 DIAGNOSIS — R2681 Unsteadiness on feet: Secondary | ICD-10-CM | POA: Diagnosis present

## 2024-02-25 DIAGNOSIS — R2689 Other abnormalities of gait and mobility: Secondary | ICD-10-CM | POA: Diagnosis present

## 2024-02-25 NOTE — Therapy (Signed)
 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, right    Cataract cortical, senile, left    Colon polyp    COVID-19 02/26/2023   Diverticulitis 10/2022   Diverticulosis    Fibrocystic breast disease    GERD (gastroesophageal reflux disease)    Hyperlipidemia    Insomnia    Osteoarthritis of left knee    Osteopenia    Past Surgical History:  Procedure Laterality Date   ABDOMINAL HYSTERECTOMY  2001   APPENDECTOMY  1956   BREAST BIOPSY Left 1996   neg   BREAST BIOPSY Left 04/06/2021   stereo bx, ribbon clip, neg   CATARACT EXTRACTION W/ INTRAOCULAR LENS IMPLANT Left 01/2023   CATARACT EXTRACTION W/ INTRAOCULAR LENS IMPLANT Right 03/2023   COLONOSCOPY N/A 12/10/2014   Procedure: COLONOSCOPY;  Surgeon: Gladis RAYMOND Mariner, MD;  Location: [facility] ENDOSCOPY;  Service: Endoscopy;  Laterality: N/A;   COLONOSCOPY WITH PROPOFOL  N/A 11/21/2020   Procedure: COLONOSCOPY WITH PROPOFOL ;  Surgeon: Therisa Bi, MD;  Location: [facility] ENDOSCOPY;  Service: Gastroenterology;  Laterality: N/A;   COLONOSCOPY WITH PROPOFOL  N/A 08/26/2023   Procedure: COLONOSCOPY WITH PROPOFOL ;  Surgeon: Therisa Bi, MD;  Location: [facility] ENDOSCOPY;  Service: Gastroenterology;  Laterality: N/A;   EXCISION MASS UPPER EXTREMETIES Right 08/09/2021   Procedure: Excision of benign neoplasm right index PIP joint and repair of extensor  tendon;  Surgeon: Edie Norleen PARAS, MD;  Location: [facility] ORS;  Service: Orthopedics;  Laterality: Right;   MASS EXCISION Right 03/19/2023   Procedure: EXCISION OF RECURRENT SOFT TISSUE MASS OF RIGHT INDEX FINGER;  Surgeon: Edie Norleen PARAS, MD;  Location: [facility] ORS;  Service: Orthopedics;  Laterality: Right;   ORIF ANKLE FRACTURE Right 2006   2 screws and pin   TOTAL KNEE ARTHROPLASTY Left 05/16/2023   Procedure: TOTAL KNEE ARTHROPLASTY;  Surgeon: Edie Norleen PARAS, MD;  Location: [facility] ORS;  Service: Orthopedics;  Laterality: Left;   Patient Active Problem List   Diagnosis Date Noted   History of diverticulitis 08/26/2023   Status post total knee replacement using cement, left 05/16/2023   Diverticulitis of large intestine with abscess without bleeding 10/29/2022   Diverticulitis 10/26/2022   Insomnia 10/26/2022   Abdominal pain 10/26/2022   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, right 07/21/2021   Change in bowel habits 10/26/2020   Primary osteoarthritis of left knee 04/30/2018   Allergic rhinitis 11/19/2013   Esophageal reflux 11/19/2013   HLD (hyperlipidemia) 11/19/2013    PCP: Diedra Lame, MD REFERRING PROVIDER: Maree Jannett POUR, MD   REFERRING DIAG: R42 (ICD-10-CM) - Dizziness   THERAPY DIAG:  Dizziness and giddiness  Imbalance  Cervicalgia  Unsteadiness on feet  ONSET DATE: roughtly, June 1st 2025  Rationale for Evaluation and Treatment:  Rehabilitation  SUBJECTIVE:   SUBJECTIVE STATEMENT:  Pt states she requested ENT fax over her records last week, but therapist has not yet received them. States yesterday she had brief, sharp pains in her L ear, just randomly 3x for only a second. Pt states still having nausea and swimmy headed feelings in the mornings that improve gradually throughout the day.   Pt states her HEP has gone well and she is doing it daily. Pt states she can usually feel her L eye abduct during pencil push-ups, but she didn't feel it as strongly  yesterday.   Pt states she has tightness along upper traps where she caries her tension.  Pt states she has follow-up with Neurologist on September 9th.   Pt reports her dizziness was 3-4/10 this morning and has improved to 1/10.   From Initial Eval Pt states she started having dizziness ~6 weeks ago and her provider initially thought it was her BP so she was put on BP medications, but that didn't resolve her symptoms so she was referred to ENT who did VNG testing, which she said caused significant symptoms on L side with the warm air (reports she could feel her eyes moving quickly), but otherwise no significant findings. Then states she had an MRI and she was told she had a mucous cyst in L sinus, which may be contributing/causing her dizziness. Pt states symptoms are primarily on her LEFT.  Findings from MRI:  Sinuses/Orbits: No mass or acute finding within the imaged orbits. Mild mucosal thickening within the right maxillary sinus. Small mucous retention cyst within a posterior left ethmoid air cell.  Pt states she feels dizzy and nauseous with symptoms worse in the morning when she gets up. Pt states in the beginning she was taking meclizine, which helped a little, but not significantly. Pt states usually when the seasons change her sinuses act up (but onset of dizziness did not occur during a seasonal change). Pt states she has sinus issues with tree allergies and so she takes medication for that year around. Pt states she called her PCP PA a couple of weeks after symptom onset.   Pt denies having any change in activities and no illness preceding the sudden onset of her dizziness symptoms. Pt states one morning she was fixing breakfast and felt something wasn't normal and pt states she thought it was her allergies, but then it just kept progressing. Pt states it isn't as bad now as it was and it is mostly in the morning. Pt states her symptoms haven't been as bad since this VNG testing.  Pt states when she opens her mouth she is a little tender on that side.   Pt also reports she carries her stress in her shoulders and was told by MD she has some knots back there. Pt states she has had dry needling before for L piriformis and said it was amazing at helping. Pt states she mostly sleeps on her R side, but tosses/turns a lot. Pt states she has had a chronic nagging headache since the onset of the dizziness and she is not someone who has hx of headaches.   Of note, pt had bilateral ptosis surgery ~3 months ago in May.   Pt states she has a follow-up appointment with Dr. Maree in October, but may call to have that moved sooner.  Pt states she doesn't feel like she is going to fall and that she does feel safe driving.   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 Differential diagnosis includes vestibular migraines, late-life migraine accompaniments, cervicogenic dizziness, orthostatic hypotension. Previous ENT evaluation and VNG were normal. Symptoms are not consistent with typical migraines.  - Order MRI of the head to rule out secondary causes - Order TSH, Vit B12, Vit D. (These labs are done to rule out systemic causes of neurological symptoms.) - Initiate vestibular rehabilitation therapy. - Magnesium : Recommend beginning Magnesium  citrate or glycinate supplements 400-600 mg per day. Magnesium  can be used as preventive treatment for migraine and other types of headaches   Cervical myofascial pain Tenderness in cervical  region with palpable trigger points. Dull headaches may originate from the neck, indicating cervical myofascial pain. - Include physical therapy for cervical myofascial pain in vestibular rehabilitation. - Recommend warm compresses, stretching, and massage for symptom relief. - Consider trigger point injections with Kenalog  and lidocaine  if conservative measures are ineffective.  She has experienced episodes of dizziness and mild nausea for six weeks, primarily in the mornings after showering. The dizziness is described as 'swimmy headed' without a sensation of falling. She also has a chronic light headache, a nagging pain at the back of her neck and the top of her head. Occasionally, she experiences brief episodes of blurred vision.   An ENT evaluation, including a VNG test, showed a violent reaction on the left side but was otherwise normal. She has tried meclizine in the past for similar symptoms related to allergies, but it did not resolve her current symptoms.     PAIN:  Are you having pain? Yes: NPRS scale: 1/10 Pain location: L ear Pain description: ache Aggravating factors: worse in the morning Relieving factors: none known  PRECAUTIONS: Fall  RED FLAGS: None   WEIGHT BEARING RESTRICTIONS: No  FALLS: Has patient fallen in last 6 months? No  LIVING ENVIRONMENT: Lives with: lives with their spouse Lives in: House/apartment Stairs: No Has following equipment at home: None  PLOF: Independent  PATIENT GOALS: Get rid of the dizziness  OBJECTIVE:  Note: Objective measures were completed at Evaluation unless otherwise noted.  DIAGNOSTIC FINDINGS:   EXAM: MRI HEAD WITHOUT CONTRAST   FINDINGS: Brain:   No age-advanced or lobar predominant cerebral atrophy.   Multifocal T2 FLAIR hyperintense signal abnormality within the cerebral white matter, nonspecific but compatible with mild chronic small vessel ischemic disease.   There is no acute infarct.   Vascular:  Maintained flow voids within the proximal large arterial vessels.   Skull and upper cervical spine: No focal worrisome marrow lesion.   Sinuses/Orbits: No mass or acute finding within the imaged orbits. Mild mucosal thickening within the right maxillary sinus. Small mucous retention cyst within a posterior left ethmoid air cell.   IMPRESSION: 1. No evidence of an acute intracranial abnormality. 2. Mild chronic small vessel ischemic changes within the cerebral white matter. 3. Mild paranasal sinus disease as described.     Electronically Signed   By: Rockey Childs D.O.   On: 02/18/2024 08:22  COGNITION: Overall cognitive status: Within functional limits for tasks assessed   SENSATION: 02/25/2024: WFL and no  known hx of sensory impairment  EDEMA:  Not formally assessed  MUSCLE TONE:  Not formally assessed  DTRs:  Not formally assessed  POSTURE:  No Significant postural limitations  Cervical ROM:    Active  need to formally assess A/PROM (deg) eval AROM 02/25/2024  Flexion    Extension Decreased  37  Right lateral flexion    Left lateral flexion    Right rotation Decreased compared to L 52  Left rotation  54  (Blank rows = not tested)  STRENGTH: Not formally assessed  LOWER EXTREMITY MMT:   MMT  Not formally assessed, but functional for independent mobility without AD Right eval Left eval  Hip flexion    Hip abduction    Hip adduction    Hip internal rotation    Hip external rotation    Knee flexion    Knee extension    Ankle dorsiflexion    Ankle plantarflexion    Ankle inversion    Ankle eversion    (Blank rows = not tested)  BED MOBILITY:  Not formally assessed, but denies impairments with this  TRANSFERS: Assistive device utilized: None  Sit to stand: Complete Independence Stand to sit: Complete Independence Chair to chair: Complete Independence Floor: Not assessed  RAMP: Not assessed  CURB: Not assessed  GAIT: Gait pattern: step  through pattern, decreased arm swing- Right, decreased arm swing- Left, decreased step length- Right, decreased step length- Left, and decreased stride length Distance walked: clinic distances Assistive device utilized: None Level of assistance: Complete Independence Comments: N/A  FUNCTIONAL TESTS:  MCTSIB: need to assess  PATIENT SURVEYS:   Neck Disability Index (NDI): need to assess  DHI: THE DIZZINESS HANDICAP INVENTORY (DHI)  P1. Does looking up increase your problem? 0 = No  E2. Because of your problem, do you feel frustrated? 0 = No  F3. Because of your problem, do you restrict your travel for business or recreation?  0 = No  P4. Does walking down the aisle of a supermarket increase your problems?  0 = No  F5. Because of your problem, do you have difficulty getting into or out of bed?  0 = No  F6. Does your problem significantly restrict your participation in social activities, such as going out to dinner, going to the movies, dancing, or going to parties? 0 = No  F7. Because of your problem, do you have difficulty reading?  2 = Sometimes  P8. Does performing more ambitious activities such as sports, dancing, household chores (sweeping or putting dishes away) increase your problems?  0 = No  E9. Because of your problem, are you afraid to leave your home without having without having someone accompany you?  0 = No  E10. Because of your problem have you been embarrassed in front of others?  0 = No  P11. Do quick movements of your head increase your problem?  2 = Sometimes  F12. Because of your problem, do you avoid heights?  0 = No  P13. Does turning over in bed increase your problem?  0 = No  F14. Because of your problem, is it difficult for you to do strenuous homework or yard work? 0 = No  E15. Because of your problem, are you afraid people may think you are intoxicated? 0 = No  F16. Because of your problem, is it difficult for you to go for a walk by yourself?  2 = Sometimes   P17. Does walking down a sidewalk increase your problem?  0 = No  E18.Because of your problem, is it difficult for you to concentrate 0 = No  F19. Because of your problem, is it difficult for you to walk around your house in the dark? 0 = No  E20. Because of your problem, are you afraid to stay home alone?  0 = No  E21. Because of your problem, do you feel handicapped? 0 = No  E22. Has the problem placed stress on your relationships with members of your family or friends? 0 = No  E23. Because of your problem, are you depressed?  0 = No  F24. Does your problem interfere with your job or household responsibilities?  0 = No  P25. Does bending over increase your problem?  2 = Sometimes  TOTAL 8    DHI Scoring Instructions  The patient is asked to answer each question as it pertains to dizziness or unsteadiness problems, specifically  considering their condition during the last month. Questions are designed to incorporate functional (F), physical  (P), and emotional (E) impacts on disability.   Scores greater than 10 points should be referred to balance specialists for further evaluation.   16-34 Points (mild handicap)  36-52 Points (moderate handicap)  54+ Points (severe handicap)  Minimally Detectable Change: 17 points ([address] [place], 1990)  [place], G. SHAUNNA. and [place], C. W. (1990). The development of the Dizziness Handicap Inventory. Archives of Otolaryngology - Head and Neck Surgery  116(4): [id number].   VESTIBULAR ASSESSMENT:   SYMPTOM BEHAVIOR:  Subjective history: details above - onset ~6 weeks ago with no provocating event  Non-Vestibular symptoms: neck pain, headaches, and nausea/vomiting  Type of dizziness: Blurred Vision and fullness feeling  Frequency: daily, worse in the morning  Duration: constant  Aggravating factors: No known aggravating factors and Worse in the morning  Relieving factors: no known relieving factors  Progression of symptoms: slightly better  following VNG testing  OCULOMOTOR EXAM:  Ocular Alignment: normal  Ocular ROM: No Limitations  Spontaneous Nystagmus: absent  Gaze-Induced Nystagmus: absent  Smooth Pursuits: intact  Saccades: slow and but otherwise WNL  Convergence/Divergence: L eye abducts during convergence testing   02/25/2024:  L eye abducts at 8 cm without glasses and 5.5cm with glasses on  Cover/Cross-Cover: WNL Pt hx of astigmatism   VESTIBULAR - OCULAR REFLEX:   Slow VOR: Normal  VOR Cancellation: Comment: initially thought there may have been corrective saccades, but after repeat testing appears WNL  Head-Impulse Test: HIT Right: positive HIT Left: positive  Dynamic Visual Acuity: Not able to be assessed   POSITIONAL TESTING: Other: Not prioritized this date based on patient subjective not indicating positional involvement to vestibular symptoms; however, may test at future visit  MOTION SENSITIVITY:  Motion Sensitivity Quotient Intensity: 0 = none, 1 = Lightheaded, 2 = Mild, 3 = Moderate, 4 = Severe, 5 = Vomiting  Intensity  1. Sitting to supine   2. Supine to L side   3. Supine to R side   4. Supine to sitting   5. L Hallpike-Dix   6. Up from L    7. R Hallpike-Dix   8. Up from R    9. Sitting, head tipped to L knee   10. Head up from L knee   11. Sitting, head tipped to R knee   12. Head up from R knee   13. Sitting head turns x5   14.Sitting head nods x5   15. In stance, 180 turn to L  16. In stance, 180 turn to R     OTHOSTATICS: not done; will check at future visit                                                                                                                             TREATMENT DATE: 02/25/2024  Cervical AROM assessed and recorded above.   Oculomotor convergence assessed: L eye abducts at 8 cm without glasses and 5.5cm with glasses on  NECK DISABILITY INDEX  Date: 02/25/2024 Score  Pain intensity 1 = The pain is very mild at the moment  2. Personal care  (washing, dressing, etc.) 0 = I can look after myself normally without causing extra pain  3. Lifting 0 =  I can lift heavy weights without extra pain  4. Reading 1 = I can read as much as I want to with slight pain in my neck Pt reports she is now being conscious to adjust placement of book so she isn't sitting in as much neck flexion  5. Headaches 1 =  I have slight headaches, which come infrequently  6. Concentration 0 =  I can concentrate fully when I want to with no difficulty  7. Work 0 =  I can do as much work as I want to  8. Driving 1 =  I can drive my car as long as I want with slight pain in my neck  9. Sleeping 1 = My sleep is slightly disturbed (less than 1 hr sleepless)  10. Recreation 0 = I am able to engage in all my recreation activities with no neck pain at all  Total 10/50 = 20%   Minimum Detectable Change (90% confidence): 5 points or 10% points  Modified CTSIB Firm surface/EO: 30sec Firm surface/EC: 30 sec with pt reporting she feels like she is swaying back and forth (side to side) despite no observable sway Foam surface/ EO: 30sec Foam surface/EC: 30sec - feels she is swaying forward and back on foam Score: 120   Standing balance interventions:  1/2 tandem, EC - 30 sec each side  Full tandem, EC - 20 sec each Increased LOB with L foot forward compared to R  Updated pt's HEP to include standing balance with eyes closed and printout provided.    Orthostatic vital sign assessment:  Supine after 5 minutes: BP 136/84 (MAP 101), HR 73 bpm  Immediately in standing: BP 133/88 (MAP 103), HR 75 bpm, pt states mild symptoms of lightheadedness, like she stood up to quickly, but not her dizziness nor nausea symptoms Standing after 3 minutes: BP 135/90 (MAP 105), HR 78 bpm   Discussed sleeping positions and pillows with pt stating she has a pillow that she feels provides good neck support and she even travels with it because she feels it makes that much of a  difference.  Educated pt on recommendation to follow-up with ENT since she feels the VNG testing is what improved her symptoms the  most. Pt planning to follow-up with ENT to also have her records faxed to therapist and Neurologist.    PATIENT EDUCATION: Education details: Balance systems, Vestibular system including peripheral vs central, findings of assessment, PT POC Person educated: Patient Education method: Explanation and Handouts Education comprehension: verbalized understanding and needs further education  HOME EXERCISE PROGRAM:  Access Code: [email or web address] URL: [email or web address] Date: 02/25/2024 Prepared by: Connell Kiss  Exercises - Pencil Pushups  - 1 x daily - 7 x weekly - 3 sets - 10 reps - Seated Gaze Stabilization with Head Rotation  - 1 x daily - 7 x weekly - 3 sets - 10 reps - Seated Gaze Stabilization with Head Nod  - 1 x daily - 7 x weekly - 3 sets - 10 reps - Narrow Standing with Counter Support  - 1 x daily - 7 x weekly - 2 sets - 30sec - 1 min hold - Standing Tandem Balance with Counter Support  - 1 x daily - 7 x weekly - 2 sets - 30 seconds hold   GOALS: Goals reviewed with patient? Yes  SHORT TERM GOALS: Target date: 04/02/2024  Patient will be independent in home exercise program to improve gaze stabilization and/or mobility for improved functional independence with ADLs while experiencing decreased dizziness symptoms. Baseline: initiated on 02/20/2024 Goal status: INITIAL  LONG TERM GOALS: Target date: 05/14/2024   Patient will report at least a 50% reduction in dizziness symptoms to indicate increased ease with ADLs and improved QOL.  Baseline: symptoms present every morning (100% of the days) Goal status: INITIAL  2.  Patient will demonstrate improvement in convergence assessment by at least 2mm indicating improved ability to fix gaze on near objects and improve ability to participate in reading activities for work. Baseline:  convergence distance to be measured, pt reports sometimes difficulty reading due to problem 02/25/2024:  L eye abducts at 8 cm without glasses and 5.5cm with glasses on Goal status: INITIAL  3.  Patient (> 6 years old) will improve cervical ROM by 10 degrees in the planes lacking full motion, without increase in pain, to indicate improved cervical mobility with daily tasks  Baseline: 02/25/2024: recorded above  Goal status: INITIAL  4.  Patient will reduce Neck Disability Index score to <10% to demonstrate minimal disability with ADL's including improved sleeping tolerance, sitting tolerance, etc for better mobility at home and work. Baseline: 02/25/2024: 10/50 = 20% Goal status: INITIAL   ASSESSMENT:  CLINICAL IMPRESSION:  Patient is a 73 y.o. female who was seen today for physical therapy treatment for dizziness/nausea that onset ~6 weeks prior to initial therapy eval with no known cause. Patient planning to have ENT notes faxed to therapist for review. Patient continues to report dizziness and nausea symptoms are most prominent in the morning. Therapy session continued with further assessments with patient demonstrating significant lack of cervical rotation ROM as well as impaired convergence at 8cm when her glasses are removed. Patient reports 20% disability on the NDI with her neck pain primarily impacting sleep, reading, driving, and some headaches. Performed orthostatic vital sign assessment transitioning from supine to standing with no significant changes in her vitals and pt denying onset of her dizziness symptoms. Patient did report feeling of sway when standing with eyes closed and therefore prescribed standing balance interventions on her HEP. Plan to perform further motion sensitivity testing and cervical spine assessment at next visit to further assess their contributions to her dizziness symptoms. The pt will benefit from further skilled  PT to improve dizziness and balance in order to  return pt to PLOF an increase QOL.   OBJECTIVE IMPAIRMENTS: decreased activity tolerance, decreased balance, decreased knowledge of condition, decreased mobility, decreased ROM, dizziness, and increased fascial restrictions.   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   REHAB POTENTIAL: Good  CLINICAL DECISION MAKING: Evolving/moderate complexity  EVALUATION COMPLEXITY: Moderate   PLAN:  PT FREQUENCY: 1-2x/week  PT DURATION: 12 weeks  PLANNED INTERVENTIONS: 97164- PT Re-evaluation, 97750- Physical Performance Testing, 97110-Therapeutic exercises, 97530- Therapeutic activity, [id number]- Neuromuscular re-education, 97535- Self Care, [id number]- Manual therapy, [id number]- Gait training, [phone]- Canalith repositioning, [id number]- Electrical stimulation (manual), [phone] (1-2 muscles), [id number] (3+ muscles)- Dry Needling, Patient/Family education, Balance training, Stair training, Taping, Joint mobilization, Spinal mobilization, Vestibular training, Visual/preceptual remediation/compensation, Cryotherapy, Moist heat, and Biofeedback  PLAN FOR NEXT SESSION:  *Pt planning to have ENT notes faxed for therapist to review - motion sensitivity quotient testing - perform further cervical evaluation & start treatment  (AROM assessment) - manual therapy - dry needling with certified therapist - test dynamic visual acuity (DVA) when/if appropriate - gaze stabilization exercises - assess TMJ if needed   Connell Kiss, PT,  DPT, NCS, CSRS Physical Therapist - [facility]  [facility]  10:21 AM 02/25/24

## 2024-03-02 NOTE — Therapy (Signed)
 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, right    Cataract cortical, senile, left    Colon polyp    COVID-19 02/26/2023   Diverticulitis 10/2022   Diverticulosis    Fibrocystic breast disease    GERD (gastroesophageal reflux disease)    Hyperlipidemia    Insomnia    Osteoarthritis of left knee    Osteopenia    Past Surgical History:  Procedure Laterality Date   ABDOMINAL HYSTERECTOMY  2001   APPENDECTOMY  1956   BREAST BIOPSY Left 1996   neg   BREAST BIOPSY Left 04/06/2021   stereo bx, ribbon clip, neg   CATARACT EXTRACTION W/ INTRAOCULAR LENS IMPLANT Left 01/2023   CATARACT EXTRACTION W/ INTRAOCULAR LENS IMPLANT Right 03/2023   COLONOSCOPY N/A 12/10/2014   Procedure: COLONOSCOPY;  Surgeon: Gladis RAYMOND Mariner, MD;  Location: [facility] ENDOSCOPY;  Service: Endoscopy;  Laterality: N/A;   COLONOSCOPY WITH PROPOFOL  N/A 11/21/2020   Procedure: COLONOSCOPY WITH PROPOFOL ;  Surgeon: Therisa Bi, MD;  Location: [facility] ENDOSCOPY;  Service: Gastroenterology;  Laterality: N/A;   COLONOSCOPY WITH PROPOFOL  N/A 08/26/2023   Procedure: COLONOSCOPY WITH PROPOFOL ;  Surgeon: Therisa Bi, MD;  Location: [facility] ENDOSCOPY;  Service: Gastroenterology;  Laterality: N/A;   EXCISION MASS UPPER EXTREMETIES Right 08/09/2021   Procedure: Excision of benign neoplasm right index PIP joint and repair of  extensor tendon;  Surgeon: Edie Norleen PARAS, MD;  Location: [facility] ORS;  Service: Orthopedics;  Laterality: Right;   MASS EXCISION Right 03/19/2023   Procedure: EXCISION OF RECURRENT SOFT TISSUE MASS OF RIGHT INDEX FINGER;  Surgeon: Edie Norleen PARAS, MD;  Location: [facility] ORS;  Service: Orthopedics;  Laterality: Right;   ORIF ANKLE FRACTURE Right 2006   2 screws and pin   TOTAL KNEE ARTHROPLASTY Left 05/16/2023   Procedure: TOTAL KNEE ARTHROPLASTY;  Surgeon: Edie Norleen PARAS, MD;  Location: [facility] ORS;  Service: Orthopedics;  Laterality: Left;   Patient Active Problem List   Diagnosis Date Noted   History of diverticulitis 08/26/2023   Status post total knee replacement using cement, left 05/16/2023   Diverticulitis of large intestine with abscess without bleeding 10/29/2022   Diverticulitis 10/26/2022   Insomnia 10/26/2022   Abdominal pain 10/26/2022   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, right 07/21/2021   Change in bowel habits 10/26/2020   Primary osteoarthritis of left knee 04/30/2018   Allergic rhinitis 11/19/2013   Esophageal reflux 11/19/2013   HLD (hyperlipidemia) 11/19/2013    PCP: Diedra Lame, MD REFERRING PROVIDER: Maree Jannett POUR, MD   REFERRING DIAG: R42 (ICD-10-CM) - Dizziness   THERAPY DIAG:  Dizziness and giddiness  Imbalance  Cervicalgia  Unsteadiness on feet  ONSET DATE: roughtly, June 1st 2025  Rationale for Evaluation and  Treatment: Rehabilitation  SUBJECTIVE:   SUBJECTIVE STATEMENT:  Pt reports she went by ENT to get information faxed to therapist (therapist still awaiting this fax and planning to call for follow-up). Pt states she does feel like her symptoms are a little better although she does still have the weird feeling in her head and associated nausea. Pt confirms symptoms still most prevalent in AM. Pt states over the past few days she has had a sharp pain in L ear a few times that just lasts a second.  Pt states dizziness today is  3/10, but improves when she is sitting at start of session to 1/10. Pt states overall I don't feel normal and I don't feel like myself.   Pt states she has been very busy this morning getting prepared to go on a trip to [place].   Follow-up with Neurologist on September 9th.     From Initial Eval Pt states she started having dizziness ~6 weeks ago and her provider initially thought it was her BP so she was put on BP medications, but that didn't resolve her symptoms so she was referred to ENT who did VNG testing, which she said caused significant symptoms on L side with the warm air (reports she could feel her eyes moving quickly), but otherwise no significant findings. Then states she had an MRI and she was told she had a mucous cyst in L sinus, which may be contributing/causing her dizziness. Pt states symptoms are primarily on her LEFT.  Findings from MRI:  Sinuses/Orbits: No mass or acute finding within the imaged orbits. Mild mucosal thickening within the right maxillary sinus. Small mucous retention cyst within a posterior left ethmoid air cell.  Pt states she feels dizzy and nauseous with symptoms worse in the morning when she gets up. Pt states in the beginning she was taking meclizine, which helped a little, but not significantly. Pt states usually when the seasons change her sinuses act up (but onset of dizziness did not occur during a seasonal change). Pt states she has sinus issues with tree allergies and so she takes medication for that year around. Pt states she called her PCP PA a couple of weeks after symptom onset.   Pt denies having any change in activities and no illness preceding the sudden onset of her dizziness symptoms. Pt states one morning she was fixing breakfast and felt something wasn't normal and pt states she thought it was her allergies, but then it just kept progressing. Pt states it isn't as bad now as it was and it is mostly in the morning. Pt states her  symptoms haven't been as bad since this VNG testing. Pt states when she opens her mouth she is a little tender on that side.   Pt also reports she carries her stress in her shoulders and was told by MD she has some knots back there. Pt states she has had dry needling before for L piriformis and said it was amazing at helping. Pt states she mostly sleeps on her R side, but tosses/turns a lot. Pt states she has had a chronic nagging headache since the onset of the dizziness and she is not someone who has hx of headaches.   Of note, pt had bilateral ptosis surgery ~3 months ago in May.   Pt states she has a follow-up appointment with Dr. Maree in October, but may call to have that moved sooner.  Pt states she doesn't feel like she is going to fall and  that she does feel safe driving.   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 Differential diagnosis includes vestibular migraines, late-life migraine accompaniments, cervicogenic dizziness, orthostatic hypotension. Previous ENT evaluation and VNG were normal. Symptoms are not consistent with typical migraines.  - Order MRI of the head to rule out secondary causes - Order TSH, Vit B12, Vit D. (These labs are done to rule out systemic causes of neurological symptoms.) - Initiate vestibular rehabilitation therapy. - Magnesium : Recommend beginning Magnesium  citrate or glycinate supplements 400-600 mg per day. Magnesium  can be used as preventive treatment for migraine and other types of headaches    Cervical myofascial pain Tenderness in cervical region with palpable trigger points. Dull headaches may originate from the neck, indicating cervical myofascial pain. - Include physical therapy for cervical myofascial pain in vestibular rehabilitation. - Recommend warm compresses, stretching, and massage for symptom relief. - Consider trigger point injections with Kenalog  and lidocaine  if conservative measures are ineffective.  She has experienced episodes of dizziness and mild nausea for six weeks, primarily in the mornings after showering. The dizziness is described as 'swimmy headed' without a sensation of falling. She also has a chronic light headache, a nagging pain at the back of her neck and the top of her head. Occasionally, she experiences brief episodes of blurred vision.   An ENT evaluation, including a VNG test, showed a violent reaction on the left side but was otherwise normal. She has tried meclizine in the past for similar symptoms related to allergies, but it did not resolve her current symptoms.     PAIN:  Are you having pain? Yes: NPRS scale: 1/10 Pain location: L ear Pain description: ache Aggravating factors: worse in the morning Relieving factors: none known  PRECAUTIONS: Fall  RED FLAGS: None   WEIGHT BEARING RESTRICTIONS: No  FALLS: Has patient fallen in last 6 months? No  LIVING ENVIRONMENT: Lives with: lives with their spouse Lives in: House/apartment Stairs: No Has following equipment at home: None  PLOF: Independent  PATIENT GOALS: Get rid of the dizziness  OBJECTIVE:  Note: Objective measures were completed at Evaluation unless otherwise noted.  DIAGNOSTIC FINDINGS:   EXAM: MRI HEAD WITHOUT CONTRAST   FINDINGS: Brain:   No age-advanced or lobar predominant cerebral atrophy.   Multifocal T2 FLAIR hyperintense signal abnormality within the cerebral white matter, nonspecific but compatible with mild chronic small vessel ischemic  disease.   There is no acute infarct.   Vascular: Maintained flow voids within the proximal large arterial vessels.   Skull and upper cervical spine: No focal worrisome marrow lesion.   Sinuses/Orbits: No mass or acute finding within the imaged orbits. Mild mucosal thickening within the right maxillary sinus. Small mucous retention cyst within a posterior left ethmoid air cell.   IMPRESSION: 1. No evidence of an acute intracranial abnormality. 2. Mild chronic small vessel ischemic changes within the cerebral white matter. 3. Mild paranasal sinus disease as described.     Electronically Signed   By: Rockey Childs D.O.   On: 02/18/2024 08:22  COGNITION: Overall cognitive  status: Within functional limits for tasks assessed   SENSATION: 02/25/2024: WFL and no known hx of sensory impairment  EDEMA:  Not formally assessed  MUSCLE TONE:  Not formally assessed  DTRs:  Not formally assessed  POSTURE:  No Significant postural limitations  Cervical ROM:    Active  need to formally assess A/PROM (deg) eval AROM 02/25/2024  Flexion    Extension Decreased  37  Right lateral flexion    Left lateral flexion    Right rotation Decreased compared to L 52  Left rotation  54  (Blank rows = not tested)  STRENGTH: Not formally assessed  LOWER EXTREMITY MMT:   MMT  Not formally assessed, but functional for independent mobility without AD Right eval Left eval  Hip flexion    Hip abduction    Hip adduction    Hip internal rotation    Hip external rotation    Knee flexion    Knee extension    Ankle dorsiflexion    Ankle plantarflexion    Ankle inversion    Ankle eversion    (Blank rows = not tested)  BED MOBILITY:  Not formally assessed, but denies impairments with this  TRANSFERS: Assistive device utilized: None  Sit to stand: Complete Independence Stand to sit: Complete Independence Chair to chair: Complete Independence Floor: Not assessed  RAMP: Not  assessed  CURB: Not assessed  GAIT: Gait pattern: step through pattern, decreased arm swing- Right, decreased arm swing- Left, decreased step length- Right, decreased step length- Left, and decreased stride length Distance walked: clinic distances Assistive device utilized: None Level of assistance: Complete Independence Comments: N/A  FUNCTIONAL TESTS:  MCTSIB: need to assess  PATIENT SURVEYS:   Neck Disability Index (NDI): need to assess  DHI: THE DIZZINESS HANDICAP INVENTORY (DHI)  P1. Does looking up increase your problem? 0 = No  E2. Because of your problem, do you feel frustrated? 0 = No  F3. Because of your problem, do you restrict your travel for business or recreation?  0 = No  P4. Does walking down the aisle of a supermarket increase your problems?  0 = No  F5. Because of your problem, do you have difficulty getting into or out of bed?  0 = No  F6. Does your problem significantly restrict your participation in social activities, such as going out to dinner, going to the movies, dancing, or going to parties? 0 = No  F7. Because of your problem, do you have difficulty reading?  2 = Sometimes  P8. Does performing more ambitious activities such as sports, dancing, household chores (sweeping or putting dishes away) increase your problems?  0 = No  E9. Because of your problem, are you afraid to leave your home without having without having someone accompany you?  0 = No  E10. Because of your problem have you been embarrassed in front of others?  0 = No  P11. Do quick movements of your head increase your problem?  2 = Sometimes  F12. Because of your problem, do you avoid heights?  0 = No  P13. Does turning over in bed increase your problem?  0 = No  F14. Because of your problem, is it difficult for you to do strenuous homework or yard work? 0 = No  E15. Because of your problem, are you afraid people may think you are intoxicated? 0 = No  F16. Because of your problem, is it  difficult for you to go for a walk by yourself?  2 = Sometimes  P17. Does walking down a sidewalk increase your problem?  0 = No  E18.Because of your problem, is it difficult for you to concentrate 0 = No  F19. Because of your problem, is it difficult for you to walk around your house in the dark? 0 = No  E20. Because of your problem, are you afraid to stay home alone?  0 = No  E21. Because of your problem, do you feel handicapped? 0 = No  E22. Has the problem placed stress on your relationships with members of your family or friends? 0 = No  E23. Because of your problem, are you depressed?  0 = No  F24. Does your problem interfere with your job or household responsibilities?  0 = No  P25. Does bending over increase your problem?  2 = Sometimes  TOTAL 8    DHI Scoring Instructions  The patient is asked to answer each question as it pertains to dizziness or unsteadiness problems, specifically  considering their condition during the last month. Questions are designed to incorporate functional (F), physical  (P), and emotional (E) impacts on disability.   Scores greater than 10 points should be referred to balance specialists for further evaluation.   16-34 Points (mild handicap)  36-52 Points (moderate handicap)  54+ Points (severe handicap)  Minimally Detectable Change: 17 points ([address] [place], 1990)  Coolidge, G. SHAUNNA. and Talmage, C. W. (1990). The development of the Dizziness Handicap Inventory. Archives of Otolaryngology - Head and Neck Surgery  116(4): [id number].   VESTIBULAR ASSESSMENT:   SYMPTOM BEHAVIOR:  Subjective history: details above - onset ~6 weeks ago with no provocating event  Non-Vestibular symptoms: neck pain, headaches, and nausea/vomiting  Type of dizziness: Blurred Vision and fullness feeling  Frequency: daily, worse in the morning  Duration: constant  Aggravating factors: No known aggravating factors and Worse in the morning  Relieving factors: no known  relieving factors  Progression of symptoms: slightly better following VNG testing  OCULOMOTOR EXAM:  Ocular Alignment: normal  Ocular ROM: No Limitations  Spontaneous Nystagmus: absent  Gaze-Induced Nystagmus: absent  Smooth Pursuits: intact  Saccades: slow and but otherwise WNL  Convergence/Divergence: L eye abducts during convergence testing   02/25/2024:  L eye abducts at 8 cm without glasses and 5.5cm with glasses on  Cover/Cross-Cover: WNL Pt hx of astigmatism   VESTIBULAR - OCULAR REFLEX:   Slow VOR: Normal  VOR Cancellation: Comment: initially thought there may have been corrective saccades, but after repeat testing appears WNL  Head-Impulse Test: HIT Right: positive HIT Left: positive  Dynamic Visual Acuity: Not able to be assessed   POSITIONAL TESTING: Other: Not prioritized this date based on patient subjective not indicating positional involvement to vestibular symptoms; however, may test at future visit  MOTION SENSITIVITY:  Motion Sensitivity Quotient Intensity: 0 = none, 1 = Lightheaded, 2 = Mild, 3 = Moderate, 4 = Severe, 5 = Vomiting  Intensity  1. Sitting to supine   2. Supine to L side   3. Supine to R side   4. Supine to sitting   5. L Hallpike-Dix   6. Up from L    7. R Hallpike-Dix   8. Up from R    9. Sitting, head tipped to L knee   10. Head up from L knee   11. Sitting, head tipped to R knee   12. Head up from R knee   13. Sitting head turns x5   14.Sitting  head nods x5   15. In stance, 180 turn to L    16. In stance, 180 turn to R     OTHOSTATICS:  Orthostatic vital sign assessment on 02/25/2024:  Supine after 5 minutes: BP 136/84 (MAP 101), HR 73 bpm  Immediately in standing: BP 133/88 (MAP 103), HR 75 bpm, pt states mild symptoms of lightheadedness, like she stood up to quickly, but not her dizziness nor nausea symptoms Standing after 3 minutes: BP 135/90 (MAP 105), HR 78 bpm                                                                                                                              TREATMENT DATE: 03/03/2024   Seated VOR X1: Horizontal with close target  3 rounds from 15sec building up to 30sec and symptoms improving after each set - glasses off Glasses on x1 for 20 seconds - no change in symptoms compared to glasses off Vertical with close target 2x 20 seconds   Educated pt on progression of VOR X1 exercise from plain background to busy background and then from sitting to standing (safely in a corner).  Re-assessed HIT (Head Impulse Test): Positive on both R and L with corrective saccade   Modified Motion Sensitivity Test  Movement Intensity (change from baseline, 0-10, no change=0, severe change=10) Duration  <5 sec = 0  5-10 s = 1 11-20 s = 2 21-30 s = 3 >30 s = 4 Score (Intensity + Duration)  5x Horizontal head turns 0  0  5x Vertical head turns 0  0  5x Right diagonal head turns (upper left quadrant down to right) 2/10  <5 seconds = 0 (as soon as head still it stopped)  2  5x Left diagonal head turns (upper right quadrant down to left) 0  0  5x Trunk Bends (bending knees reaching to floor) No dizziness, just lightheadedness 0 0  5x Right quarter body urns (look over right shoulder with trunk rotation, feet planted) 2/10 <5 seconds = 0 (when stopped moving) 2  5x Left quarter body turns (look over left shoulder with trunk rotation, feet planted) 2/10 < 5 seconds = 0  2  1x 360 degree turn to right 3/10, feels like she is still moving for a second after she stops turning < 5 seconds = 0  3  1x 360 degree turn to left 3/10,  < 5 seconds = 0 3  5x VOR cancellation (follow thumbs horizontally with head/trunk rotation x45 degrees each way) 3/10 lightheaded 10 seconds = 1 4  TOTAL SCORE    16  MSQ = total score x (# of positions)/14   0-10 mild range 11-30 moderate range 31-100 severe range   11.42 = mild      Updated pt's HEP to include 360degree turning and standing VOR cancellation because those  caused greatest symptoms while still being only up to 3/10 and lasting 10seconds or less. Educated  pt on goal of vestibular habituation and keeping symptoms <5/10 and recovering back to baseline within 20 seconds of rest.   Convergence test without glasses on 5cm today!  Therapist called [facility] to request patient's ENT records be faxed for therapist's review and left voicemail with physician assistance, [place].     PATIENT EDUCATION: Education details: Balance systems, Vestibular system including peripheral vs central, findings of assessment, PT POC Person educated: Patient Education method: Explanation and Handouts Education comprehension: verbalized understanding and needs further education  HOME EXERCISE PROGRAM:  Access Code: [email or web address] URL: [email or web address] Date: 03/03/2024 Prepared by: Connell Kiss  Exercises - Pencil Pushups  - 1 x daily - 7 x weekly - 3 sets - 10 reps - Seated Gaze Stabilization with Head Rotation  - 1 x daily - 7 x weekly - 3 sets - 10 reps - Seated Gaze Stabilization with Head Nod  - 1 x daily - 7 x weekly - 3 sets - 10 reps - Narrow Stance with Counter Support - EYES CLOSED  - 1 x daily - 7 x weekly - 2 sets - 30sec - 1 min hold - Standing Tandem Balance with Counter Support  - 1 x daily - 7 x weekly - 2 sets - 30 seconds hold - Turning in Corner 360  - 1 x daily - 7 x weekly - 2 sets - 5 reps - VOR Cancellation  - 1 x daily - 7 x weekly - 2 sets - 5 reps    GOALS: Goals reviewed with patient? Yes  SHORT TERM GOALS: Target date: 04/02/2024  Patient will be independent in home exercise program to improve gaze stabilization and/or mobility for improved functional independence with ADLs while experiencing decreased dizziness symptoms. Baseline: initiated on 02/20/2024 Goal status: INITIAL  LONG TERM GOALS: Target date: 05/14/2024   Patient will report at least a 50% reduction in dizziness symptoms to indicate  increased ease with ADLs and improved QOL.  Baseline: symptoms present every morning (100% of the days) Goal status: INITIAL  2.  Patient will demonstrate improvement in convergence assessment by at least 2mm indicating improved ability to fix gaze on near objects and improve ability to participate in reading activities for work. Baseline: convergence distance to be measured, pt reports sometimes difficulty reading due to problem 02/25/2024:  L eye abducts at 8 cm without glasses and 5.5cm with glasses on 03/03/2024: 5 cm without glasses Goal status: IN PROGRESS  3.  Patient (> 27 years old) will improve cervical ROM by 10 degrees in the planes lacking full motion, without increase in pain, to indicate improved cervical mobility with daily tasks  Baseline: 02/25/2024: recorded above  Goal status: INITIAL  4.  Patient will reduce Neck Disability Index score to <10% to demonstrate minimal disability with ADL's including improved sleeping tolerance, sitting tolerance, etc for better mobility at home and work. Baseline: 02/25/2024: 10/50 = 20% Goal status: INITIAL   ASSESSMENT:  CLINICAL IMPRESSION:  Patient is a 73 y.o. female who was seen today for physical therapy treatment for dizziness/nausea that onset ~6 weeks prior to initial therapy eval with no known cause. Therapist left voicemail with ENT requesting patient's records be faxed over for therapist's review. Patient continues to report dizziness and nausea symptoms are most prominent in the morning. Therapy session continued with further assessment of motion sensitivity with pt having greatest symptoms with R diagonal head turns, quarter body turns both directions, 360 degree turns both directions, and VOR cancellation.  Extensive patient education during session on therapy goal of vestibular habituation exercises to decrease her symptom response to movements as well as close target gaze stabilization exercises due to abnormal findings on HIT  indicating potential peripheral vestibular involvement. The pt will benefit from further skilled PT to improve dizziness and balance in order to return pt to PLOF an increase QOL.   OBJECTIVE IMPAIRMENTS: decreased activity tolerance, decreased balance, decreased knowledge of condition, decreased mobility, decreased ROM, dizziness, and increased fascial restrictions.   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   REHAB POTENTIAL: Good  CLINICAL DECISION MAKING: Evolving/moderate complexity  EVALUATION COMPLEXITY: Moderate   PLAN:  PT FREQUENCY: 1-2x/week  PT DURATION: 12 weeks  PLANNED INTERVENTIONS: 97164- PT Re-evaluation, 97750- Physical Performance Testing, 97110-Therapeutic exercises, 97530- Therapeutic activity, [id number]- Neuromuscular re-education, 97535- Self Care, [id number]- Manual therapy, [id number]- Gait training, [phone]- Canalith repositioning, [id number]- Electrical stimulation (manual), [phone] (1-2 muscles), [phone] (3+ muscles)- Dry Needling, Patient/Family education, Balance training, Stair training, Taping, Joint mobilization, Spinal mobilization, Vestibular training, Visual/preceptual remediation/compensation, Cryotherapy, Moist heat, and Biofeedback  PLAN FOR NEXT SESSION:  *Pt planning to have ENT notes faxed for therapist to review - review close target VOR X1 - test dynamic visual acuity (DVA) when/if appropriate - progress gaze stabilization exercises - re-assess motion sensitivity -  perform further cervical evaluation & start treatment  (AROM assessment) - manual therapy - dry needling with certified therapist    Connell Kiss, PT, DPT, NCS, CSRS Physical Therapist - [facility]  [facility]  11:04 AM 03/03/24

## 2024-03-03 ENCOUNTER — Ambulatory Visit: Admitting: Physical Therapy

## 2024-03-03 DIAGNOSIS — M542 Cervicalgia: Secondary | ICD-10-CM

## 2024-03-03 DIAGNOSIS — R42 Dizziness and giddiness: Secondary | ICD-10-CM

## 2024-03-03 DIAGNOSIS — R2689 Other abnormalities of gait and mobility: Secondary | ICD-10-CM

## 2024-03-03 DIAGNOSIS — R2681 Unsteadiness on feet: Secondary | ICD-10-CM

## 2024-03-09 ENCOUNTER — Ambulatory Visit: Admitting: Physical Therapy

## 2024-03-11 ENCOUNTER — Ambulatory Visit: Admitting: Physical Therapy

## 2024-03-11 DIAGNOSIS — R2689 Other abnormalities of gait and mobility: Secondary | ICD-10-CM

## 2024-03-11 DIAGNOSIS — M542 Cervicalgia: Secondary | ICD-10-CM

## 2024-03-11 DIAGNOSIS — R42 Dizziness and giddiness: Secondary | ICD-10-CM | POA: Diagnosis not present

## 2024-03-11 DIAGNOSIS — R2681 Unsteadiness on feet: Secondary | ICD-10-CM

## 2024-03-11 NOTE — Therapy (Signed)
 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, right    Cataract cortical, senile, left    Colon polyp    COVID-19 02/26/2023   Diverticulitis 10/2022   Diverticulosis    Fibrocystic breast disease    GERD (gastroesophageal reflux disease)    Hyperlipidemia    Insomnia    Osteoarthritis of left knee    Osteopenia    Past Surgical History:  Procedure Laterality Date   ABDOMINAL HYSTERECTOMY  2001   APPENDECTOMY  1956   BREAST BIOPSY Left 1996   neg   BREAST BIOPSY Left 04/06/2021   stereo bx, ribbon clip, neg   CATARACT EXTRACTION W/ INTRAOCULAR LENS IMPLANT Left 01/2023   CATARACT EXTRACTION W/ INTRAOCULAR LENS IMPLANT Right 03/2023   COLONOSCOPY N/A 12/10/2014   Procedure: COLONOSCOPY;  Surgeon: Gladis RAYMOND Mariner, MD;  Location: [facility] ENDOSCOPY;  Service: Endoscopy;  Laterality: N/A;   COLONOSCOPY WITH PROPOFOL  N/A 11/21/2020   Procedure: COLONOSCOPY WITH PROPOFOL ;  Surgeon: Therisa Bi, MD;  Location: [facility] ENDOSCOPY;  Service: Gastroenterology;  Laterality: N/A;   COLONOSCOPY WITH PROPOFOL  N/A 08/26/2023   Procedure: COLONOSCOPY WITH PROPOFOL ;  Surgeon: Therisa Bi, MD;  Location: [facility] ENDOSCOPY;  Service: Gastroenterology;  Laterality: N/A;   EXCISION MASS UPPER EXTREMETIES Right 08/09/2021   Procedure: Excision of benign neoplasm right index PIP joint and repair of  extensor tendon;  Surgeon: Edie Norleen PARAS, MD;  Location: [facility] ORS;  Service: Orthopedics;  Laterality: Right;   MASS EXCISION Right 03/19/2023   Procedure: EXCISION OF RECURRENT SOFT TISSUE MASS OF RIGHT INDEX FINGER;  Surgeon: Edie Norleen PARAS, MD;  Location: [facility] ORS;  Service: Orthopedics;  Laterality: Right;   ORIF ANKLE FRACTURE Right 2006   2 screws and pin   TOTAL KNEE ARTHROPLASTY Left 05/16/2023   Procedure: TOTAL KNEE ARTHROPLASTY;  Surgeon: Edie Norleen PARAS, MD;  Location: [facility] ORS;  Service: Orthopedics;  Laterality: Left;   Patient Active Problem List   Diagnosis Date Noted   History of diverticulitis 08/26/2023   Status post total knee replacement using cement, left 05/16/2023   Diverticulitis of large intestine with abscess without bleeding 10/29/2022   Diverticulitis 10/26/2022   Insomnia 10/26/2022   Abdominal pain 10/26/2022   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, right 07/21/2021   Change in bowel habits 10/26/2020   Primary osteoarthritis of left knee 04/30/2018   Allergic rhinitis 11/19/2013   Esophageal reflux 11/19/2013   HLD (hyperlipidemia) 11/19/2013    PCP: Diedra Lame, MD REFERRING PROVIDER: Maree Jannett POUR, MD   REFERRING DIAG: R42 (ICD-10-CM) - Dizziness   THERAPY DIAG:  Unsteadiness on feet  Dizziness and giddiness  Imbalance  Cervicalgia  ONSET DATE: roughtly, June 1st 2025  Rationale for Evaluation  and Treatment: Rehabilitation  SUBJECTIVE:   SUBJECTIVE STATEMENT:  Pt states she has been healed because she has not had an episodes/dizziness symptoms since last therapy visit. Pt states she has also not had any of the sharp pain in her L ear since last session. Pt states she was doing her exercises up until her symptoms stopped.   Pt denies hx of migraine. Pt states she used to have migraines when she was on birthcontrol (when she was young), but when she stopped they went away and states since having her hysterectomy (at 73y.o.)  her migraines stopped completely.   Pt states she does still get sinus headaches with seasonal changes that is located across her cheek bones and along frontal lobe region. Pt states since she has had the right combination of sinus medications, to manage her allergies, she has not really had sinus headaches.   Denies headaches with physical activity.   Pt denies motion sickness in vehicle, but states she may have had this as a child, but unsure if she just preferred to sit in the front seat when going up/down windy [place] roads.  Pt denies any medications changes recently nor around the time of dizziness onset.  Therapist received notes from ENT VNG testing from July 9th, 2025 stating Normal VNG. There was no evidence of BPPV, nor was there any evidence of a central or peripheral pathology. It states Calorics were symmetric and robust.   Follow-up with Neurologist on September 9th.    From Initial Eval Pt states she started having dizziness ~6 weeks ago and her provider initially thought it was her BP so she was put on BP medications, but that didn't resolve her symptoms so she was referred to ENT who did VNG testing, which she said caused significant symptoms on L side with the warm air (reports she could feel her eyes moving quickly), but otherwise no significant findings. Then states she had an MRI and she was told she had a mucous cyst in L sinus, which may be contributing/causing her dizziness. Pt states symptoms are primarily on her LEFT.  Findings from MRI:  Sinuses/Orbits: No mass or acute finding within the imaged orbits. Mild mucosal thickening within the right maxillary sinus. Small mucous retention cyst within a posterior left ethmoid air cell.  Pt states she feels dizzy and nauseous with symptoms worse in the morning when she gets up. Pt states in the beginning she was taking meclizine, which helped a little, but not significantly. Pt states usually when the seasons change  her sinuses act up (but onset of dizziness did not occur during a seasonal change). Pt states she has sinus issues with tree allergies and so she takes medication for that year around. Pt states she called her PCP PA a couple of weeks after symptom onset.   Pt denies having any change in activities and no illness preceding the sudden onset of her dizziness symptoms. Pt states one morning she was fixing breakfast and felt something wasn't normal and pt states she thought it was her allergies, but then it just kept progressing. Pt states it isn't as bad now as it was and it is mostly in the morning. Pt states her symptoms haven't been as bad since this VNG testing. Pt states when she opens her mouth she is a little tender on that side.   Pt also reports she carries her stress in her shoulders and was told by MD she has some knots back there. Pt states she  has had dry needling before for L piriformis and said it was amazing at helping. Pt states she mostly sleeps on her R side, but tosses/turns a lot. Pt states she has had a chronic nagging headache since the onset of the dizziness and she is not someone who has hx of headaches.   Of note, pt had bilateral ptosis surgery ~3 months ago in May.   Pt states she has a follow-up appointment with Dr. Maree in October, but may call to have that moved sooner.  Pt states she doesn't feel like she is going to fall and that she does feel safe driving.   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  Differential diagnosis includes vestibular migraines, late-life migraine accompaniments, cervicogenic dizziness, orthostatic hypotension. Previous ENT evaluation and VNG were normal. Symptoms are not consistent with typical migraines.  - Order MRI of the head to rule out secondary causes - Order TSH, Vit B12, Vit D. (These labs are done to rule out systemic causes of neurological symptoms.) - Initiate vestibular rehabilitation therapy. - Magnesium : Recommend beginning Magnesium  citrate or glycinate supplements 400-600 mg per day. Magnesium  can be used as preventive treatment for migraine and other types of headaches   Cervical myofascial pain Tenderness in cervical region with palpable trigger points. Dull headaches may originate from the neck, indicating cervical myofascial pain. - Include physical therapy for cervical myofascial pain in vestibular rehabilitation. - Recommend warm compresses, stretching, and massage for symptom relief. - Consider trigger point injections with Kenalog  and lidocaine  if conservative measures are ineffective.  She has experienced episodes of dizziness and mild nausea for six weeks, primarily in the mornings after showering. The dizziness is described as 'swimmy headed' without a sensation of falling. She also has a chronic light headache, a nagging pain at the back of her neck and the top of her head. Occasionally, she experiences brief episodes of blurred vision.   An ENT evaluation, including a VNG test, showed a violent reaction on the left side but was otherwise normal. She has tried meclizine in the past for similar symptoms related to allergies, but it did not resolve her current symptoms.     PAIN:  Are you having pain? Yes: NPRS scale: 1/10 Pain location: L ear Pain description: ache Aggravating factors: worse in the morning Relieving factors: none known  PRECAUTIONS: Fall  RED FLAGS: None   WEIGHT BEARING RESTRICTIONS: No  FALLS: Has patient  fallen in last 6 months? No  LIVING ENVIRONMENT: Lives with: lives with their spouse Lives in: House/apartment Stairs: No Has following equipment at home: None  PLOF: Independent  PATIENT GOALS: Get rid of the dizziness  OBJECTIVE:  Note: Objective measures were completed at Evaluation unless otherwise noted.  DIAGNOSTIC FINDINGS:   EXAM: MRI HEAD WITHOUT CONTRAST   FINDINGS: Brain:   No age-advanced or lobar predominant cerebral atrophy.   Multifocal T2 FLAIR hyperintense signal abnormality within the cerebral white matter, nonspecific but compatible with mild chronic small vessel ischemic disease.   There is no acute infarct.   Vascular: Maintained flow  voids within the proximal large arterial vessels.   Skull and upper cervical spine: No focal worrisome marrow lesion.   Sinuses/Orbits: No mass or acute finding within the imaged orbits. Mild mucosal thickening within the right maxillary sinus. Small mucous retention cyst within a posterior left ethmoid air cell.   IMPRESSION: 1. No evidence of an acute intracranial abnormality. 2. Mild chronic small vessel ischemic changes within the cerebral white matter. 3. Mild paranasal sinus disease as described.     Electronically Signed   By: Rockey Childs D.O.   On: 02/18/2024 08:22  COGNITION: Overall cognitive status: Within functional limits for tasks assessed   SENSATION: 02/25/2024: WFL and no known hx of sensory impairment  EDEMA:  Not formally assessed  MUSCLE TONE:  Not formally assessed  DTRs:  Not formally assessed  POSTURE:  No Significant postural limitations  Cervical ROM:    Active  need to formally assess A/PROM (deg) eval AROM 02/25/2024 AROM  03/11/2024  Flexion   60  Extension Decreased  37 45  Right lateral flexion     Left lateral flexion     Right rotation Decreased compared to L 52 60  Left rotation  54 56  (Blank rows = not tested)  STRENGTH: Not formally assessed  LOWER  EXTREMITY MMT:   MMT  Not formally assessed, but functional for independent mobility without AD Right eval Left eval  Hip flexion    Hip abduction    Hip adduction    Hip internal rotation    Hip external rotation    Knee flexion    Knee extension    Ankle dorsiflexion    Ankle plantarflexion    Ankle inversion    Ankle eversion    (Blank rows = not tested)  BED MOBILITY:  Not formally assessed, but denies impairments with this  TRANSFERS: Assistive device utilized: None  Sit to stand: Complete Independence Stand to sit: Complete Independence Chair to chair: Complete Independence Floor: Not assessed  RAMP: Not assessed  CURB: Not assessed  GAIT: Gait pattern: step through pattern, decreased arm swing- Right, decreased arm swing- Left, decreased step length- Right, decreased step length- Left, and decreased stride length Distance walked: clinic distances Assistive device utilized: None Level of assistance: Complete Independence Comments: N/A  FUNCTIONAL TESTS:  MCTSIB: need to assess  PATIENT SURVEYS:   Neck Disability Index (NDI): need to assess  DHI: THE DIZZINESS HANDICAP INVENTORY (DHI)  P1. Does looking up increase your problem? 0 = No  E2. Because of your problem, do you feel frustrated? 0 = No  F3. Because of your problem, do you restrict your travel for business or recreation?  0 = No  P4. Does walking down the aisle of a supermarket increase your problems?  0 = No  F5. Because of your problem, do you have difficulty getting into or out of bed?  0 = No  F6. Does your problem significantly restrict your participation in social activities, such as going out to dinner, going to the movies, dancing, or going to parties? 0 = No  F7. Because of your problem, do you have difficulty reading?  2 = Sometimes  P8. Does performing more ambitious activities such as sports, dancing, household chores (sweeping or putting dishes away) increase your problems?  0 = No   E9. Because of your problem, are you afraid to leave your home without having without having someone accompany you?  0 = No  E10. Because of your problem have  you been embarrassed in front of others?  0 = No  P11. Do quick movements of your head increase your problem?  2 = Sometimes  F12. Because of your problem, do you avoid heights?  0 = No  P13. Does turning over in bed increase your problem?  0 = No  F14. Because of your problem, is it difficult for you to do strenuous homework or yard work? 0 = No  E15. Because of your problem, are you afraid people may think you are intoxicated? 0 = No  F16. Because of your problem, is it difficult for you to go for a walk by yourself?  2 = Sometimes  P17. Does walking down a sidewalk increase your problem?  0 = No  E18.Because of your problem, is it difficult for you to concentrate 0 = No  F19. Because of your problem, is it difficult for you to walk around your house in the dark? 0 = No  E20. Because of your problem, are you afraid to stay home alone?  0 = No  E21. Because of your problem, do you feel handicapped? 0 = No  E22. Has the problem placed stress on your relationships with members of your family or friends? 0 = No  E23. Because of your problem, are you depressed?  0 = No  F24. Does your problem interfere with your job or household responsibilities?  0 = No  P25. Does bending over increase your problem?  2 = Sometimes  TOTAL 8    DHI Scoring Instructions  The patient is asked to answer each question as it pertains to dizziness or unsteadiness problems, specifically  considering their condition during the last month. Questions are designed to incorporate functional (F), physical  (P), and emotional (E) impacts on disability.   Scores greater than 10 points should be referred to balance specialists for further evaluation.   16-34 Points (mild handicap)  36-52 Points (moderate handicap)  54+ Points (severe handicap)  Minimally  Detectable Change: 17 points ([address] [place], 1990)  Strang, G. SHAUNNA. and [place], C. W. (1990). The development of the Dizziness Handicap Inventory. Archives of Otolaryngology - Head and Neck Surgery  116(4): [id number].   VESTIBULAR ASSESSMENT:   SYMPTOM BEHAVIOR:  Subjective history: details above - onset ~6 weeks ago with no provocating event  Non-Vestibular symptoms: neck pain, headaches, and nausea/vomiting  Type of dizziness: Blurred Vision and fullness feeling  Frequency: daily, worse in the morning  Duration: constant  Aggravating factors: No known aggravating factors and Worse in the morning  Relieving factors: no known relieving factors  Progression of symptoms: slightly better following VNG testing  OCULOMOTOR EXAM:  Ocular Alignment: normal  Ocular ROM: No Limitations  Spontaneous Nystagmus: absent  Gaze-Induced Nystagmus: absent  Smooth Pursuits: intact  Saccades: slow and but otherwise WNL  Convergence/Divergence: L eye abducts during convergence testing   02/25/2024:  L eye abducts at 8 cm without glasses and 5.5cm with glasses on  Cover/Cross-Cover: WNL Pt hx of astigmatism   VESTIBULAR - OCULAR REFLEX:   Slow VOR: Normal  VOR Cancellation: Comment: initially thought there may have been corrective saccades, but after repeat testing appears WNL  Head-Impulse Test: HIT Right: positive HIT Left: positive  Dynamic Visual Acuity: Not able to be assessed   POSITIONAL TESTING: Other: Not prioritized this date based on patient subjective not indicating positional involvement to vestibular symptoms; however, may test at future visit  MOTION SENSITIVITY:  Motion Sensitivity Quotient Intensity: 0 =  none, 1 = Lightheaded, 2 = Mild, 3 = Moderate, 4 = Severe, 5 = Vomiting  Intensity  1. Sitting to supine   2. Supine to L side   3. Supine to R side   4. Supine to sitting   5. L Hallpike-Dix   6. Up from L    7. R Hallpike-Dix   8. Up from R    9. Sitting, head  tipped to L knee   10. Head up from L knee   11. Sitting, head tipped to R knee   12. Head up from R knee   13. Sitting head turns x5   14.Sitting head nods x5   15. In stance, 180 turn to L    16. In stance, 180 turn to R     OTHOSTATICS:  Orthostatic vital sign assessment on 02/25/2024:  Supine after 5 minutes: BP 136/84 (MAP 101), HR 73 bpm  Immediately in standing: BP 133/88 (MAP 103), HR 75 bpm, pt states mild symptoms of lightheadedness, like she stood up to quickly, but not her dizziness nor nausea symptoms Standing after 3 minutes: BP 135/90 (MAP 105), HR 78 bpm                                                                                                                             TREATMENT DATE: 03/11/2024  Based on patient experiencing resolution of her symptoms, decided to collect further hx on potential migraine involvement, re-assess oculomotor and VOR testing, as well as perform cervical assessment.  OCULOMOTOR EXAM:  Ocular Alignment: normal  Ocular ROM: No Limitations  Spontaneous Nystagmus: absent  Gaze-Induced Nystagmus: absent  Smooth Pursuits: intact  Saccades: slight undershooting noticed during horizontal saccades, normal during diagonal saccades  Convergence: L eye abducts during convergence testing   03/11/2024:  L eye abducts at 5.5 cm with and without glasses  Cover/Cross-Cover: WNL, normal horizontal re-adjustments after uncovering eye  VESTIBULAR - OCULAR REFLEX:   Slow VOR: Normal  VOR Cancellation: Comment: initially thought there may have been corrective saccades towards the right, but after repeat testing appears WNL  Head-Impulse Test: HIT Right: positive consistently; HIT Left: potentially slightly positive at first, but inconsistent     Re-assessed cervical AROM - details above.   Performed palpation of thoracic and cervical spine:  Minor muscular tightness/restrictions in bilateral levator scaps, but not causing symptoms during  palpation Tenderness to palpation of T5-T6 region as well as around C6 level Pt denies hx of neck pain, but just states she carries her stress in her upper shoulders.    Therapist educated pt on recommendation to continue with pencil push-up exercises as well as VOR gaze stabilization given positive HIT testing towards R and improvement on L.   PATIENT EDUCATION: Education details: Balance systems, Vestibular system including peripheral vs central, findings of assessment, PT POC Person educated: Patient Education method: Explanation and Handouts Education comprehension: verbalized understanding and needs further education  HOME EXERCISE  PROGRAM:  Access Code: [email or web address] URL: [email or web address] Date: 03/03/2024 Prepared by: Connell Kiss  Exercises - Pencil Pushups  - 1 x daily - 7 x weekly - 3 sets - 10 reps - Seated Gaze Stabilization with Head Rotation  - 1 x daily - 7 x weekly - 3 sets - 10 reps - Seated Gaze Stabilization with Head Nod  - 1 x daily - 7 x weekly - 3 sets - 10 reps - Narrow Stance with Counter Support - EYES CLOSED  - 1 x daily - 7 x weekly - 2 sets - 30sec - 1 min hold - Standing Tandem Balance with Counter Support  - 1 x daily - 7 x weekly - 2 sets - 30 seconds hold - Turning in Corner 360  - 1 x daily - 7 x weekly - 2 sets - 5 reps - VOR Cancellation  - 1 x daily - 7 x weekly - 2 sets - 5 reps    GOALS: Goals reviewed with patient? Yes  SHORT TERM GOALS: Target date: 04/02/2024  Patient will be independent in home exercise program to improve gaze stabilization and/or mobility for improved functional independence with ADLs while experiencing decreased dizziness symptoms. Baseline: initiated on 02/20/2024 Goal status: INITIAL  LONG TERM GOALS: Target date: 05/14/2024   Patient will report at least a 50% reduction in dizziness symptoms to indicate increased ease with ADLs and improved QOL.  Baseline: symptoms present every morning (100% of  the days) Goal status: INITIAL  2.  Patient will demonstrate improvement in convergence assessment by at least 2mm indicating improved ability to fix gaze on near objects and improve ability to participate in reading activities for work. Baseline: convergence distance to be measured, pt reports sometimes difficulty reading due to problem 02/25/2024:  L eye abducts at 8 cm without glasses and 5.5cm with glasses on 03/03/2024: 5 cm without glasses Goal status: IN PROGRESS  3.  Patient (> 26 years old) will improve cervical ROM by 10 degrees in the planes lacking full motion, without increase in pain, to indicate improved cervical mobility with daily tasks  Baseline: 02/25/2024: recorded above  Goal status: INITIAL  4.  Patient will reduce Neck Disability Index score to <10% to demonstrate minimal disability with ADL's including improved sleeping tolerance, sitting tolerance, etc for better mobility at home and work. Baseline: 02/25/2024: 10/50 = 20% Goal status: INITIAL   ASSESSMENT:  CLINICAL IMPRESSION:  Patient is a 73 y.o. female who was seen today for physical therapy treatment for dizziness/nausea that onset ~6 weeks prior to initial therapy eval with no known cause. Therapist received VNG testing from ENT, which reports findings as normal. Patient reports she has not had any dizziness symptoms nor sharp pain in her L ear since last therapy session. Patient reports she was performing her HEP until her symptoms resolved. Therapy session focused on repeat testing of oculomotor and VOR as well as repeat and further assessment of cervical spine. Patient continues to demo impaired convergence with L eye abducting at 5.5cm and positive HIT testing towards R (improved some on L). Pt also demonstrates increased cervical AROM and only reporting tenderness to palpation at T5-T6 levels and around C6; however, did not provoke symptoms. Patient denies any significant migraine hx. Educated pt on recommendation  to continue her HEP and pt has follow-up scheduled with Neurology on September 9th.The pt will benefit from further skilled PT to improve dizziness and balance in order to return pt to PLOF an increase QOL.  OBJECTIVE IMPAIRMENTS: decreased activity tolerance, decreased balance, decreased knowledge of condition, decreased mobility, decreased ROM, dizziness, and increased fascial restrictions.   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   REHAB POTENTIAL: Good  CLINICAL DECISION MAKING: Evolving/moderate complexity  EVALUATION COMPLEXITY: Moderate   PLAN:  PT FREQUENCY: 1-2x/week  PT DURATION: 12 weeks  PLANNED INTERVENTIONS: 97164- PT Re-evaluation, 97750- Physical Performance Testing, 97110-Therapeutic exercises, 97530- Therapeutic activity, [id number]- Neuromuscular re-education, 97535- Self Care, [id number]- Manual therapy, [id number]- Gait training, [phone]- Canalith repositioning, [id number]- Electrical stimulation (manual), [phone] (1-2 muscles), [id number] (3+ muscles)- Dry Needling, Patient/Family education, Balance training, Stair training, Taping, Joint mobilization, Spinal mobilization, Vestibular training, Visual/preceptual remediation/compensation, Cryotherapy, Moist heat, and Biofeedback  PLAN FOR NEXT SESSION:  - review close target VOR X1 - review updated exercises following motion sensitivity test - re-assess motion sensitivity when appropriate - progress gaze stabilization exercises - test dynamic  visual acuity (DVA) when/if appropriate - perform further cervical evaluation & start treatment if needed - manual therapy    Hajer Dwyer, PT, DPT, NCS, CSRS Physical Therapist - [place]  [facility]  4:59 PM 03/11/24

## 2024-03-12 ENCOUNTER — Ambulatory Visit: Admitting: Physical Therapy

## 2024-03-16 ENCOUNTER — Ambulatory Visit: Admitting: Physical Therapy

## 2024-03-17 ENCOUNTER — Other Ambulatory Visit: Payer: Self-pay

## 2024-03-19 ENCOUNTER — Ambulatory Visit: Admitting: Physical Therapy

## 2024-03-24 ENCOUNTER — Other Ambulatory Visit: Payer: Self-pay

## 2024-03-24 MED ORDER — AZELASTINE HCL 0.1 % NA SOLN
1.0000 | Freq: Every day | NASAL | 3 refills | Status: AC
  Filled 2024-03-24: qty 30, 90d supply, fill #0
  Filled 2024-08-27: qty 30, 90d supply, fill #1

## 2024-03-26 ENCOUNTER — Ambulatory Visit: Admitting: Physical Therapy

## 2024-03-30 ENCOUNTER — Other Ambulatory Visit: Payer: Self-pay

## 2024-03-30 MED ORDER — CELECOXIB 200 MG PO CAPS
200.0000 mg | ORAL_CAPSULE | Freq: Two times a day (BID) | ORAL | 1 refills | Status: AC
  Filled 2024-03-30: qty 180, 90d supply, fill #0
  Filled 2024-08-27: qty 180, 90d supply, fill #1

## 2024-04-02 ENCOUNTER — Other Ambulatory Visit: Payer: Self-pay

## 2024-04-07 ENCOUNTER — Ambulatory Visit: Admitting: Physical Therapy

## 2024-04-09 ENCOUNTER — Ambulatory Visit: Admitting: Physical Therapy

## 2024-04-14 ENCOUNTER — Other Ambulatory Visit: Payer: Self-pay

## 2024-04-14 MED ORDER — FLUZONE HIGH-DOSE 0.5 ML IM SUSY
0.5000 mL | PREFILLED_SYRINGE | Freq: Once | INTRAMUSCULAR | 0 refills | Status: AC
  Filled 2024-04-14: qty 0.5, 1d supply, fill #0

## 2024-04-22 ENCOUNTER — Encounter (INDEPENDENT_AMBULATORY_CARE_PROVIDER_SITE_OTHER): Payer: Self-pay

## 2024-04-24 ENCOUNTER — Other Ambulatory Visit: Payer: Self-pay | Admitting: Medical Genetics

## 2024-04-24 DIAGNOSIS — Z006 Encounter for examination for normal comparison and control in clinical research program: Secondary | ICD-10-CM

## 2024-05-18 ENCOUNTER — Other Ambulatory Visit: Payer: Self-pay

## 2024-05-18 MED ORDER — AMOXICILLIN 500 MG PO CAPS
2000.0000 mg | ORAL_CAPSULE | ORAL | 1 refills | Status: AC
  Filled 2024-05-18: qty 20, 5d supply, fill #0

## 2024-05-20 ENCOUNTER — Other Ambulatory Visit: Payer: Self-pay | Admitting: Family Medicine

## 2024-05-20 DIAGNOSIS — Z1231 Encounter for screening mammogram for malignant neoplasm of breast: Secondary | ICD-10-CM

## 2024-06-01 ENCOUNTER — Other Ambulatory Visit: Payer: Self-pay

## 2024-06-01 MED ORDER — FLUTICASONE PROPIONATE 50 MCG/ACT NA SUSP
2.0000 | Freq: Every day | NASAL | 1 refills | Status: AC
  Filled 2024-06-01: qty 48, 90d supply, fill #0

## 2024-06-16 ENCOUNTER — Other Ambulatory Visit: Payer: Self-pay

## 2024-06-25 ENCOUNTER — Other Ambulatory Visit: Payer: Self-pay

## 2024-06-29 ENCOUNTER — Other Ambulatory Visit: Payer: Self-pay

## 2024-06-29 MED ORDER — SIMVASTATIN 40 MG PO TABS
40.0000 mg | ORAL_TABLET | Freq: Every day | ORAL | 1 refills | Status: AC
  Filled 2024-06-29: qty 90, 90d supply, fill #0

## 2024-06-29 MED ORDER — MONTELUKAST SODIUM 10 MG PO TABS
10.0000 mg | ORAL_TABLET | Freq: Every day | ORAL | 1 refills | Status: AC
  Filled 2024-06-29: qty 90, 90d supply, fill #0

## 2024-06-29 MED ORDER — CELECOXIB 200 MG PO CAPS
200.0000 mg | ORAL_CAPSULE | Freq: Two times a day (BID) | ORAL | 1 refills | Status: AC
  Filled 2024-06-29: qty 180, 90d supply, fill #0

## 2024-07-06 ENCOUNTER — Ambulatory Visit
Admission: RE | Admit: 2024-07-06 | Discharge: 2024-07-06 | Disposition: A | Source: Ambulatory Visit | Attending: Family Medicine | Admitting: Family Medicine

## 2024-07-06 DIAGNOSIS — Z1231 Encounter for screening mammogram for malignant neoplasm of breast: Secondary | ICD-10-CM

## 2024-07-10 ENCOUNTER — Other Ambulatory Visit: Payer: Self-pay

## 2024-07-10 MED ORDER — AZITHROMYCIN 250 MG PO TABS
ORAL_TABLET | ORAL | 0 refills | Status: AC
  Filled 2024-07-10: qty 6, 5d supply, fill #0

## 2024-07-13 ENCOUNTER — Other Ambulatory Visit: Payer: Self-pay

## 2024-07-13 MED ORDER — DICYCLOMINE HCL 10 MG PO CAPS
10.0000 mg | ORAL_CAPSULE | Freq: Two times a day (BID) | ORAL | 0 refills | Status: AC | PRN
  Filled 2024-07-13: qty 60, 30d supply, fill #0

## 2024-08-12 ENCOUNTER — Other Ambulatory Visit: Payer: Self-pay

## 2024-08-12 MED ORDER — FLUOROURACIL 5 % EX CREA
TOPICAL_CREAM | CUTANEOUS | 0 refills | Status: AC
  Filled 2024-08-12: qty 40, 30d supply, fill #0

## 2024-08-12 MED ORDER — DIAZEPAM 5 MG PO TABS
ORAL_TABLET | ORAL | 0 refills | Status: AC
  Filled 2024-08-12: qty 3, 1d supply, fill #0

## 2024-08-12 MED ORDER — ERYTHROMYCIN 5 MG/GM OP OINT
1.0000 | TOPICAL_OINTMENT | Freq: Two times a day (BID) | OPHTHALMIC | 0 refills | Status: AC
  Filled 2024-08-12: qty 3.5, 30d supply, fill #0

## 2024-08-28 ENCOUNTER — Other Ambulatory Visit: Payer: Self-pay
